# Patient Record
Sex: Female | Born: 1995 | Race: White | Hispanic: No | Smoking: Former smoker
Health system: Southern US, Community
[De-identification: ages and names within clinical notes are randomized; demographics above are authoritative.]

## PROBLEM LIST (undated history)

## (undated) ENCOUNTER — Inpatient Hospital Stay (HOSPITAL_COMMUNITY): Payer: Self-pay

## (undated) DIAGNOSIS — L309 Dermatitis, unspecified: Secondary | ICD-10-CM

## (undated) DIAGNOSIS — E162 Hypoglycemia, unspecified: Secondary | ICD-10-CM

## (undated) DIAGNOSIS — G43909 Migraine, unspecified, not intractable, without status migrainosus: Secondary | ICD-10-CM

## (undated) DIAGNOSIS — Z87442 Personal history of urinary calculi: Secondary | ICD-10-CM

## (undated) DIAGNOSIS — F419 Anxiety disorder, unspecified: Secondary | ICD-10-CM

## (undated) DIAGNOSIS — R112 Nausea with vomiting, unspecified: Secondary | ICD-10-CM

## (undated) DIAGNOSIS — N200 Calculus of kidney: Secondary | ICD-10-CM

## (undated) DIAGNOSIS — F99 Mental disorder, not otherwise specified: Secondary | ICD-10-CM

## (undated) DIAGNOSIS — R002 Palpitations: Secondary | ICD-10-CM

## (undated) DIAGNOSIS — Z9889 Other specified postprocedural states: Secondary | ICD-10-CM

## (undated) DIAGNOSIS — J45909 Unspecified asthma, uncomplicated: Secondary | ICD-10-CM

## (undated) HISTORY — DX: Migraine, unspecified, not intractable, without status migrainosus: G43.909

## (undated) HISTORY — DX: Dermatitis, unspecified: L30.9

## (undated) HISTORY — PX: SINOSCOPY: SHX187

## (undated) HISTORY — DX: Palpitations: R00.2

## (undated) HISTORY — DX: Hypoglycemia, unspecified: E16.2

## (undated) HISTORY — DX: Mental disorder, not otherwise specified: F99

## (undated) HISTORY — DX: Unspecified asthma, uncomplicated: J45.909

## (undated) HISTORY — DX: Calculus of kidney: N20.0

## (undated) NOTE — ED Notes (Signed)
 Formatting of this note might be different from the original. Assumed care of pt.  Pt found lying in bed, awake, alert, smiling, cheerful, no sign of distress.  Pt's father @ bedside.  Pt denies NV, able to tolerate ice chips.  Pt denies abd pain.  Will continue to monitor pt.  Electronically signed by: CHRISTELLA JONELLE EVERITT MERIBETH RN 02/13/2013 11:34 PM  Electronically signed by everitt Meribeth, Hadassah Fine Renomeron (R.N.) at 02/13/2013 11:34 PM PDT

## (undated) NOTE — Procedures (Signed)
 Associated Order(s): Contraception Post-Procedure Diagnose(s): IMPLANTABLE SUBDERMAL CONTRACEPTIVE REMOVAL AND REINSERTION Formatting of this note might be different from the original.  Contraception Performed by: Evonnie Gains (M.D.), M.D. Authorized by: Tat, Sonya (M.D.), M.D.   Procedure(s) Performed: Removal and reinsertion, non-biodegradable drug delivery implant  CONSENT: Risks, benefits, alternatives and reasons for the procedure were discussed. All of the patient's questions were answered. Informed consent for the procedure was obtained.    TIMEOUT: The patients identity was verified.  The site was verified.  The procedure was verified.  Anesthesia used: local infiltration.  Anesthetic used: lidocaine  1%.  Removal and reinsertion, non-biodegradable drug delivery implant - Did you complete the entire procedure? Yes  Additional Procedure Documentation: Katherine Mora [id number] is a 31 year old [id number] female. Nexplanon/Implanon inserted on: 03/2016. Contraceptive plan: Nexplanon.  Patient's last menstrual period was 03/07/2019.  Pregnancy test: neg  Patient's dominant hand: right. Nexplanon inserted in left arm.  Risks, benefits, alternatives, limitations and side effects of Nexplanon addressed with patient.  Risks, benefits, alternatives of Nexplanon removal explained.   Preop diagnosis: Nexplanon removal with reinsertion Postop diagnosis: SAME  Patient was placed in supine position with the left arm externally rotated and the elbow bent to 90 degrees and the location of the implant was verified by palpation. The end closest to the elbow was marked with a sterile marker. The area was cleaned with Betadine .  Three ml of 1% lidocaine  was injected underneath the end of the implant closest to the elbow and along the insertion canal.  While pressing down on the end of the implant closest to the axilla, a small incision was made in the longitudinal direction of the arm at the  tip of the implant closest to the elbow.  The implant was gently pushed toward the incision until the tip became visible.  The implant was grasped with sterile forceps and gently removed in its entirety (with sharp/blunt dissection).   The Nexplanon sterile applicator was removed with care to keep the applicator sterile. Nexplanon was visually verified within the needle with the needle cap in place. Needle tip with beveled side up was inserted at a less than 20 degree angle until skin penetration.  Applicator was lowered to parallel the arm; the skin was tented with the tip of the needle while inserting to its full length. The needle was fully retracted, and the implant was easily palpable, both by me and by the patient.  The incision was closed with a steri strip and an adhesive bandage was applied.  A pressure bandage using sterile gauze was applied.  The patient tolerated the procedure well and there were no complications.  Post procedure instructions reviewed with patient. Patient was given her User card to indicate date of insertion and removal.  Keep area dry for 24 hours. Remove pressure bandage in 24 hours and Band-Aid in 3 days. Remove steristrip in 7 days.   GAINS TAT MD Electronically signed 9/22/202010:46 AM [facility] [address] [place] [address] [phone]   Electronically signed by Evonnie Gains (M.D.), M.D. at 03/26/2019 10:58 AM PDT

## (undated) NOTE — ED Notes (Signed)
 Formatting of this note might be different from the original. REPORT GIVEN TO ZACHARY RN FOR CONTINUITY OF CARE  Electronically signed by: MERYLL JOANE URRIZA RN 10/22/2013 3:02 AM  Electronically signed by Urriza, Meryll Joane (R.N.) at 10/22/2013  3:02 AM PDT

## (undated) NOTE — ED Provider Notes (Signed)
 Formatting of this note is different from the original.   HistoryGLYNDA Mora MRN: [id number]  DOB: 08/28/1995  Physician start time: 9:46 PM 02/13/2013.  CHIEF COMPLAINT: VOMITING AND DIARRHEA  TRIAGE NURSE  PRESENTING HISTORY History of Present Illness: states abd discomfort , nausea and vomiting and diarrhea on and off x 1 week Associated Symptoms: dry membranes noted , no distress. General Appearance-DistressLevel: MILD Pre Hospital Care: REST Industrial Related: NO Stated Medical History: ASTHMA  _______________________________________________________________   History was provided by patient and father  Katherine Mora is a 89 year old female with intermittent diarrhea, no melena or hematochezia, x 1 week, approximately 4-5 times per day, with vomiting beginning today, 4 episodes, non-bloody, non-bilious, associated with fevers once today.  Last emesis at 4pm.  Patient ate a sandwich at 5pm, without vomiting since.  Patient had 3 episodes of diarrhea again today.  No recent travel/antibiotics/camping.  No sick contacts. Mild diffuse intermittent non-radiating abdominal cramping alleviated by vomiting episodes.  -------------------------------------------------------------------------------------------------------------------  PRIMARY CARE PROVIDER: Martin Junella Dragon (D.O.)  ACTIVE PROBLEM LIST: Patient Active Problem List:   MIGRAINE   EXERCISE INDUCED ASTHMA   INFLUENZA VACCINATION NOT CARRIED OUT BECAUSE OF CAREGIVER REFUSAL   BMI PEDS >=95 PERCENTILE   SLEEP DISORDER   ADHD, INATTENTIVE   PRECONCEPTION COUNSELING  MEDICINE: No Facility-Administered Medications for the 02/13/13 encounter [facility] Encounter). Outpatient Prescriptions Marked as Taking for the 02/13/13 encounter [facility] Encounter): Dextroamphetamine (DEXEDRINE SPANSULE) 10 mg Oral SR Cap, TAKE 1 CAPSULE ORALLY EVERY MORNING Naratriptan (AMERGE) 2.5 mg Oral Tab, Take 1 tablet by mouth at  onset of migraine headache. May repeat after 4 hours if migraine is not relieved. Do not exceed 2 tablets in 24 hours or 6 tablets per week SUMAtriptan (IMITREX) 50 mg Oral Tab, Take 1 tablet by mouth at onset of migraine headache. May repeat 1 time after 2 hours if migraine is not relieved. Do not exceed 4 tablets in 24 hours Fexofenadine (ALLEGRA ALLERGY ) 60 mg Oral Tab, 1 TAB PO BID Albuterol  (PROAIR  HFA) 90 mcg/actuation Inhl HFAA, SHAKE WELL AND INHALE 2 PUFFS ORALLY EVERY 4 TO 6 HOURS AS NEEDED FOR SHORTNESS OF BREATH OR WHEEZING - 90 DAY SUPPLY FOR ASTHMA IS 1 CANISTER (8.5 GM) Inhalational Spacer (AEROCHAMBER Z-STAT PLUS-FLW SG) Misc Spcr, USE AS DIRECTED  ALLERGY : Penicillins Class  SOCIAL HISTORY:  Social History  Marital Status: Single          Social History Main Topics  Smoking Status: Passive Smoke Exposure - Neve*  Packs/Day: 0.00  Years:       Smokeless Status: Never Used                      Comment: both parents smoke outside  IMMUNIZATION:  Immunization History Administered            Date(s) Administered  DTaP (Diphtheria, Tetanus, acellular Pertussis)                        05/28/1997  09/01/1997  09/29/1997                          12/29/1998  02/19/2002    HAV ped (Hepatitis A)                        02/19/2002  08/06/2004    HBV ped/adol, 3dose sch (Hepatitis B)  05/28/1997  09/01/1997  12/29/1998    HIB (Haemophilus influenzae b)                        05/28/1997  09/01/1997  12/29/1998    HPV4 (Human papillomavirus, quadrivalent)                        09/09/2008  11/10/2008  03/10/2009    MENcn-ACYW (Meningococcal conjugate, groups ACYW-135)                        11/10/2008    MMR (Measles, Mumps, Rubella)                        03/31/1998  02/19/2002    POL-IPV (Polio, Inactivated virus)                        12/29/1998  02/19/2002    POL-OPV (Polio, live virus)                        05/28/1997  09/01/1997    Tdap (ADACEL)  (Tetanus, diphtheria, acellular pertussis)                        09/09/2008    VAR (Varicella, chickenpox)                        03/31/1998  09/29/2005    Initial VS: BP: 120/75 mmHg / Pulse: 82 / Temp: 98 F (36.7 C) / Resp: 20 / SpO2: 99 %   History Reviewed:  I have reviewed the Medical/Surgical, Family and Social history as displayed in [facility] on the date of the encounter or the portion(s) as noted in the progress note.  Review of Systems  Constitutional: Negative for fever.  Skin: Negative for rash.  Cardiovascular: Negative for chest pain.  Respiratory: Negative for shortness of breath.   Gastrointestinal: Positive for nausea, vomiting, abdominal pain and diarrhea. Negative for constipation, blood in stool and melena.  Genitourinary: Negative for dysuria, urgency and frequency.  Musculoskeletal: Negative for back pain.  Neurological: Negative for dizziness and headaches.  Psychiatric/Behavioral: Negative for depression and suicidal ideas.  All other systems reviewed and are negative.  Physical Exam  Constitutional: She is oriented to person, place, and time. She appears well-developed and well-nourished. No distress.  HENT:  Head: Normocephalic and atraumatic.  Mouth/Throat: Oropharynx is clear and moist.  Eyes: Conjunctivae are normal.  Neck: Normal range of motion. Neck supple.  Cardiovascular: Normal rate, regular rhythm and normal heart sounds.   No murmur heard. Pulmonary/Chest: Effort normal and breath sounds normal. No respiratory distress. She has no wheezes. She has no rales.  Abdominal: Soft. Bowel sounds are normal. She exhibits no distension. There is no tenderness. There is no rebound, no guarding, no tenderness at McBurney's point and negative Murphy's sign.  Musculoskeletal: Normal range of motion. She exhibits no edema and no tenderness.  Neurological: She is alert and oriented to person, place, and time.  Skin: Skin is warm and dry.  Psychiatric:  She has a normal mood and affect. Her behavior is normal. Judgment and thought content normal.  Nursing note and vitals reviewed.        Results for orders placed during the hospital encounter of 02/13/13  -CBC W DIFFERENTIAL,  AUTO  WBC'S AUTO x1000/mcL  6.6   Range:   4.0 - 11.0 x1000/mcL   RBC, AUTO Mill/mcL  5.00   Range:   4.20 - 5.40 Mill/mcL   HGB g/dL  85.2   Range:   87.9 - 16.0 g/dL   HCT, AUTO %  56.6   Range:   37.0 - 47.0 %   MCV fL  86.6   Range:   81.0 - 99.0 fL   MCH pg/cell  29.5   Range:   27.0 - 35.0 pg/cell   MCHC g/dL  65.8   Range:   67.9 - 37.0 g/dL   RDW, BLOOD %  86.6   Range:   11.5 - 14.5 %   PLATELETS, AUTOMATED COUNT x1000/mcL  209   Range:   130 - 400 x1000/mcL   -ELECTROLYTE PANEL (NA, K, CL, CO2)  SODIUM mEq/L  141   Range:   135 - 145 mEq/L   POTASSIUM mEq/L  3.8   Range:   3.5 - 5.0 mEq/L   CHLORIDE mEq/L  109   Range:   101 - 111 mEq/L   CO2 mEq/L  27   Range:   21 - 31 mEq/L   -BUN, SERUM  BUN mg/dL  11   Range:   <=81 mg/dL   -CREATININE, SERUM  CREATININE mg/dL  9.28   Range:   <=8.99 mg/dL   GLOMERULAR FILTRATION RATE mL/min/BSA  NOT VALID <10YR        RACE   Non Black        -GLUCOSE, RANDOM  GLUCOSE, RANDOM mg/dL  90   Range:   70 - 859 mg/dL   -BILIRUBIN, TOTAL, SERUM  BILIRUBIN, TOTAL mg/dL  0.6   Range:   <=8.9 mg/dL   -ALKALINE PHOSPHATASE  ALKALINE PHOSPHATASE U/L  75   Range:   <=350 U/L   -AST, SERUM  AST U/L  19   Range:   <=30 U/L   -ALT, SERUM  ALT U/L  15   Range:   <=54 U/L   -LIPASE  LIPASE U/L  24   Range:   <=58 U/L   -WBC DIFFERENTIAL, AUTOMATED  NEUTROPHILS %, AUTOMATED COUNT %  48.8   Range:   42.0 - 75.0 %   LYMPHOCYTES %, AUTOMATED COUNT %  39.0   Range:   20.0 - 51.0 %   MONOS %, AUTO %  8.8   Range:   1.0 - 12.0 %   EOSINOPHILS %, AUTOMATED COUNT %  3.1   Range:   0.0 - 10.0 %   BASOPHILS %, AUTOMATED COUNT %  0.3   Range:   0.0 - 1.0 %   RBC NUCLEATED AUTO COUNT, BLD %  0.0   Range:   <=0.0 %    NEUTROPHILS, ABSOLUTE, AUTOMATED COUNT x1000/mcL  3.2   Range:   1.5 - 8.0 x1000/mcL   LYMPHOCYTES, AUTOMATED COUNT x1000/mcL  2.6   Range:   1.2 - 3.4 x1000/mcL   MONOCYTES, AUTOMATED COUNT x1000/mcL  0.6   Range:   0.1 - 1.0 x1000/mcL   EOSINOPHILS, AUTOMATED COUNT x1000/mcL  0.2   Range:   0.0 - 0.7 x1000/mcL   BASOPHILS, AUTOMATED COUNT x1000/mcL  0.0   Range:   0.0 - 0.2 x1000/mcL   11:35 PM  Patient unable to provide stool studies at this time -- she was given sample cups to take home and then bring to her primary care doctor if diarrhea persists.  EMERGENCY DEPARTMENT COURSE:  Orders Placed This Encounter  ? CBC W DIFFERENTIAL, AUTO  ? ELECTROLYTE PANEL (NA, K, CL, CO2)  ? BUN, SERUM  ? CREATININE, SERUM  ? GLUCOSE, RANDOM  ? BILIRUBIN, TOTAL, SERUM  ? ALKALINE PHOSPHATASE  ? AST, SERUM  ? ALT, SERUM  ? LIPASE  ? STOOL WBC  ? O AND P IDENTIFICATION  ? STOOL CULTURE  ? WBC DIFFERENTIAL, AUTOMATED  ? Sodium Chloride  0.9 % Inj Syg (NORMAL SALINE FLUSH)  ? Sodium Chloride  0.9 % Bolus IV Soln  ? Ondansetron  (PF) Inj 4 mg (ZOFRAN )  ? PREGNANCY TEST, URINE, POCT   ________________________________________________________________    ED vitals sign trends: Patient Vitals for the past 10 hrs:  BP Temp Pulse Resp SpO2 Weight  02/13/13 2232 115/65 mmHg - 79 18 100 % -  02/13/13 2132 120/75 mmHg 98 F (36.7 C) 82 20 99 % 81.9 kg (180 lb 8.9 oz)     ________________________________________________________________ MDM: Well-appearing patient, tolerating POs in ED, with normal laboratory workup and vitals.  No longer nauseous.  No indication for imaging given benign abdomen.  No indication for antibiotics though protracted diarrhea course warrants stool studies, sample for which she will bring to her primary care doctor.  Low suspicion clinically for appendicitis, cholecystitis, diverticulitis, or other serious intraabdominal pathology.  Strict return precautions discussed with  family/patient who expressed understanding. Patient and family are happy with plan.   ASSESSMENT: 1) acute gastroenteritis  PLAN: Please see ED Course Primary care doctor 1-2 days -- bring stool samples Return precautions      Electronically signed by: Rosaline LULLA Dage, M.D. [facility]  Emergency Department  02/13/2013 11:39 PM   Electronically signed by Dage Rosaline Simmer (M.D.) at 02/13/2013 11:39 PM PDT

## (undated) NOTE — ED Provider Notes (Signed)
 Formatting of this note is different from the original.   Chief Complaint  Patient presents with  ? ALLERGIC REACTION   Katherine Mora is a 32 year old female female with history of fibromyalgia presenting with allergic reaction.  Patient notes being started on Dulera, taking a dose roughly 5 hours ago then subsequently feeling chest tightness and shortness of breath.  She denies facial swelling or tongue tongue swelling.  She denies rash.  She notes that she is feeling improved at this time.  No fever, cough.  Notes mild sore throat.  No other modifying factors.  Patient Active Problem List:    MIGRAINE    DECLINES INFLUENZA VACCINATION BY CAREGIVER    SLEEP DISORDER    ADHD, PREDOMINANTLY INATTENTIVE PRESENTATION    PRECONCEPTION COUNSELING    DECLINES INFLUENZA VACCINATION    INTERMITTENT ASTHMA    ALLERGIC RHINITIS    BILAT HIP JOINT PAIN    INJECTABLE CONTRACEPTIVE SURVEILLANCE    FIBROMYALGIA    PRESENCE OF IMPLANTABLE SUBDERMAL CONTRACEPTIVE    OBESITY, BMI 31-31.9, ADULT  Past Surgical History:   Past Surgical History:  Procedure Laterality Date  ? NASAL SEPTOPLASTY W TURBINATE REDUCTION Bilateral 06/26/2013   Procedure: NASAL SEPTOPLASTY W TURBINATE REDUCTION;  Laterality: Bilateral;  Surgeon: Leigh Ginny Rasmussen (M.D.)  ? TURBINATE CAUTERIZATION OF NOSE Bilateral 09/20/2019   Procedure: NASAL TURBINATE CAUTERIZATION;  Laterality: Bilateral;  Surgeon: Mayeno, John Kunio (M.D.), M.D.  ? PAST SURGICAL HISTORY, OTHER     None   Current Facility-Administered Medications for the 12/21/19 encounter [facility] Encounter)  Medication Dose  ? Etonogestrel Implant 68 mg (NEXPLANON)  1 Each  ? Sodium Chloride  0.9% IV Premix     No outpatient medications have been marked as taking for the 12/21/19 encounter [facility] Encounter).   Allergies:  Effexor [venlafaxine hcl], Penicillins class, Prednisone, and Clindamycin hydrochloride Social History   Tobacco Use  ? Smoking status:  Former Smoker    Packs/day: 0.25    Years: 7.00    Pack years: 1.75    Types: Cigarettes    Quit date: 09/04/2018    Years since quitting: 1.2  ? Smokeless tobacco: Never Used  Substance Use Topics  ? Alcohol use: No    Alcohol/week: 0.0 oz  ? Drug use: Not Currently    Comment: medical marijuana   Primary Care MD:  Uypitching, Cindy Justo (M.D.) BP (!) 145/84 (BP Location: LA-LEFT ARM)   Pulse 100   Temp 98 F (36.7 C)   Resp 16   Wt 101 kg (222 lb 10.6 oz)   SpO2 97%   BMI 38.22 kg/m    Review of Systems  Constitutional: Negative for chills and fever.  HENT: Negative for congestion.   Eyes: Negative for pain, redness and itching.  Respiratory: Positive for shortness of breath. Negative for cough.   Cardiovascular: Positive for chest pain.  Gastrointestinal: Negative for abdominal pain, diarrhea, nausea and vomiting.  Genitourinary: Negative for dysuria.  Musculoskeletal: Negative for myalgias.  Skin: Negative for rash.  Neurological: Negative for dizziness and headaches.  Hematological: Does not bruise/bleed easily.  Psychiatric/Behavioral: Negative for agitation and behavioral problems.   Physical Exam Vitals and nursing note reviewed.  Constitutional:      General: She is not in acute distress.    Appearance: Normal appearance. She is well-developed. She is not ill-appearing, toxic-appearing or diaphoretic.     Comments: BP (!) 145/84 (BP Location: LA-LEFT ARM)   Pulse 100   Temp 98 F (36.7  C)   Resp 16   Wt 101 kg (222 lb 10.6 oz)   SpO2 97%   BMI 38.22 kg/m   Nontoxic, well-appearing, no distress  HENT:     Head: Normocephalic and atraumatic.     Comments: Oropharynx within normal limits.  No tongue swelling noted    Right Ear: Hearing and external ear normal.     Left Ear: Hearing and external ear normal.     Nose: Nose normal.  Eyes:     General: No scleral icterus.    Conjunctiva/sclera: Conjunctivae normal.  Cardiovascular:     Rate and  Rhythm: Normal rate and regular rhythm.     Pulses: Normal pulses.     Heart sounds: Normal heart sounds.  Pulmonary:     Effort: Pulmonary effort is normal.     Breath sounds: Normal breath sounds. No wheezing, rhonchi or rales.     Comments: Lungs are clear to auscultation Abdominal:     Palpations: Abdomen is soft.     Tenderness: There is no abdominal tenderness. There is no guarding or rebound.  Musculoskeletal:        General: Normal range of motion.  Skin:    General: Skin is warm and dry.  Neurological:     Mental Status: She is alert and oriented to person, place, and time.  Psychiatric:        Speech: Speech normal.        Behavior: Behavior normal. Behavior is cooperative.       \  ASSESSMENT:   ICD-10-CM  1. ALLERGIC REACTION, INIT  T80.44XA   46 year old female in no distress presenting with possible allergic reaction.  Lungs are clear.  She does not exhibit any signs of facial swelling, angioedema or tongue swelling.  Patient treated with oral Decadron and oral Benadryl .  Unlikely anaphylaxis or airway emergency.  Recommend follow-up with regular doctor in 4-5 days  Orders Placed This Encounter  ? diphenhydrAMINE  Cap 25 mg (BENADRYL )  ? dexAMETHasone Tab 6 mg (DECADRON)   Electronically signed by Quintin Picking, MD  Emergency Medicine [facility] [place] 12/21/2019 3:40 AM  Electronically signed by Picking Quintin Dumas (M.D.), M.D. at 12/21/2019  3:42 AM PDT

## (undated) NOTE — ED Provider Notes (Signed)
 Formatting of this note might be different from the original.   History: Katherine Mora is a  17 years  6 months female C/o 1 d h/o mild chest pain, worse when she takes deep breath or moves trunk. No focal weakness or numbness.  No problems with speech or gait. No abdomen pain lmp 1 week No exertional sx. No n/v/d No dizzy or syncope. Patient. C/o anxiety. No fever or cough. No dvt risk factors.  Patient Active Problem List:    MIGRAINE    EXERCISE INDUCED ASTHMA    INFLUENZA VACCINATION NOT CARRIED OUT BECAUSE OF CAREGIVER REFUSAL    BMI PEDS >=95 PERCENTILE    SLEEP DISORDER    ADHD, INATTENTIVE    PRECONCEPTION COUNSELING  PSH: Past Surgical History:   PAST SURGICAL HISTORY, OTHER                                    Comment:None   NASAL SEPTOPLASTY W TURBINATE REDUCTION         Bilateral 06/26/2013     Comment:Procedure: NASAL SEPTOPLASTY W TURBINATE               REDUCTION;  Laterality: Bilateral;  Surgeon:               Leigh Ginny Rasmussen (M.D.)  Outpatient Prescriptions Marked as Taking for the 10/22/13 encounter [facility] Encounter): Dextroamphetamine (DEXEDRINE SPANSULE) 10 mg Oral SR Cap, Take 1 capsule by mouth every morning Albuterol  (PROAIR  HFA) 90 mcg/actuation Inhl HFAA, SHAKE WELL AND INHALE 2 PUFFS ORALLY EVERY 4 TO 6 HOURS AS NEEDED FOR SHORTNESS OF BREATH OR WHEEZING - 90 DAY SUPPLY FOR ASTHMA IS 1 CANISTER (8.5 GM)  ALLERGY :  Penicillins Class       Skin Rash and/or Hives  Social History   Marital Status: Sport And Exercise Psychologist* Spouse Name:                      Years of Education:                 Number of children:             Social History Main Topics   Smoking Status: Passive Smoke Exposure - Neve*  Packs/Day: 0.00  Years:         Smokeless Status: Never Used                       Comment: both parents smoke outside  Review of patient's family history indicates:   Coronary Artery Disease        Father                   Coronary Artery  Disease        Grandmother             Primary care physician: Katherine Mora (D.O.)  BP 131/79   Pulse 77   Temp(Src) 98.7 F (37.1 C)   Resp 20   Ht 1.651 m (5' 5)   Wt 83.8 kg (184 lb 11.9 oz)   BMI 30.74 kg/m2     SpO2 97%     Physical Exam  Constitutional: She is oriented to person, place, and time. She appears well-developed and well-nourished.  Well-appearing Breathing comfortably  HENT:  Head: Normocephalic and atraumatic.  Right Ear: External ear normal.  Left Ear: External ear normal.  Nose: Nose normal.  Mouth/Throat: Oropharynx is clear and moist.  Eyes: Conjunctivae and EOM are normal. Pupils are equal, round, and reactive to light. Right eye exhibits no discharge. Left eye exhibits no discharge. No scleral icterus.  Neck: Normal range of motion and full passive range of motion without pain. Neck supple. No JVD present. No Brudzinski's sign and no Kernig's sign noted.  Cardiovascular: Normal rate, regular rhythm, S1 normal, S2 normal, normal heart sounds and intact distal pulses.  Exam reveals no gallop and no friction rub.   No murmur heard. +2 symmetric peripheral pulses  Pulmonary/Chest: Effort normal and breath sounds normal. No stridor. No respiratory distress. She has no wheezes. She has no rales. She exhibits tenderness.  Abdominal: Soft. Bowel sounds are normal. She exhibits no distension and no mass. There is no tenderness. There is no rebound and no guarding.  Musculoskeletal: Normal range of motion. She exhibits no edema or tenderness.  Lymphadenopathy:    She has no cervical adenopathy.  Neurological: She is alert and oriented to person, place, and time. No cranial nerve deficit. She exhibits normal muscle tone. Coordination normal.  Motor 5/5 x 4 Sens wnl Nl speech/gait  Skin: Skin is warm and dry. No rash noted. No erythema. No pallor.  Psychiatric: She has a normal mood and affect. Her behavior is normal. Judgment normal.  Nursing note and  vitals reviewed.        cxr no infiltrate/effusion  hcg neg.  ekg nsr  toradol --relief  Impression:  Atypical chest pain  Findings, differential diagnosis, plan and possible outcomes discussed with patient. Red flag symptoms/signs discussed with patient, who expresses understanding, agreement, and appreciation. All questions regarding their care have been answered.  Strict ED return precautions were given to the patient. The patient was instructed to return immediately to the ED if any changes or worsening of condition should occur.  I have immediately routed a copy of this note to Katherine Mora (D.O.) to notify of this patient's visit and to coordinate further management.  Electronically signed by: Electronically signed by Dr. Marsa POUR. [facility] Emergency [facility] [place] 10/22/2013  10/22/2013 3:48 AM  Electronically signed by Cam Marsa POUR (M.D.) at 10/22/2013  3:48 AM PDT Electronically signed by Cam Marsa POUR (M.D.) at 10/22/2013  3:48 AM PDT

## (undated) NOTE — ED Provider Notes (Signed)
 Formatting of this note is different from the original.   History: 10/20/2012 10:28 PM  TRIAGE NURSE PRESENTING HISTORY History of Present Illness: migraine headaches ,  DIZZINESS, NAUSEA, NO VOMITING. SX STARTED 3 DAYS AGO General Appearance-DistressLevel: MILD Pre Hospital Care: MEDICATIONS (aleve )    Katherine Mora is a 17 year old female who presents with MIGRAINE HEADACHE and LOW BLOOD PRESSURE. History of migraine headache x 5 days. Taking naproxen . Also takes two tabs of elavil at bedtime. Endorsing typical bilateral-frontal headache x 3-4 days, constant, not going away. Reports history of migraine headache usually 2x/week. Frequently associated with white spots in vision. This headache not worse of life, rates 7/10, not sudden onset. Patient denies fever, neck pain/stiffness, trauma/inciting event, vomiting, acute hearing changes, ear pain, eye pain, tinnitus, chest pain, palpitations, shortness of breath or difficulty breathing, cough, abdominal pain, dysuria, focal weakness, changes in sensation, changes in gait/speech, or any other concerns. Here with father who states checked patient's blood pressure today and it was low, systolic 80s. Usually in 110s.   No Facility-Administered Medications for the 10/20/12 encounter [facility] Encounter). Outpatient Prescriptions Marked as Taking for the 10/20/12 encounter [facility] Encounter): Amitriptyline (ELAVIL) 10 mg Oral Tab Dextroamphetamine (DEXEDRINE SPANSULE) 10 mg Oral SR Cap Naproxen  (NAPROSYN ) 500 mg Oral Tab  Past Medical History:  Patient Active Problem List:   MIGRAINE   EXERCISE INDUCED ASTHMA   INFLUENZA VACCINATION NOT CARRIED OUT BECAUSE OF CAREGIVER REFUSAL   BMI PEDS >=95 PERCENTILE   SLEEP DISORDER   ADHD, INATTENTIVE  Social History:    Smoking Status: Passive Smoke Exposure - Neve*  Packs/Day: 0.00  Years:       Smokeless Status: Never Used                      Comment: both parents smoke outside  Allergy :    Penicillins Class       Skin Rash and/or Hives  Vital Signs:  BP 117/63  Pulse 79  Temp(Src) 98 F (36.7 C)  Resp 20  Wt 80.8 kg (178 lb 2.1 oz)  SpO2 99%  History Reviewed:  I have reviewed the Medical/Surgical, Family and Social history as displayed in [facility] on the date of the encounter or the portion(s) as noted in the progress note.  Review of Systems  Constitutional: Negative for fever.  Skin: Negative for rash.  HENT: Negative for hearing loss, ear pain, neck pain and tinnitus.   Eyes: Negative for blurred vision, double vision, photophobia, pain and redness.  Cardiovascular: Negative for chest pain and palpitations.  Respiratory: Negative for cough and sputum production.   Gastrointestinal: Positive for nausea. Negative for vomiting and abdominal pain.  Genitourinary: Negative for dysuria.  Endo/Heme/Allergies: Does not bruise/bleed easily.  Neurological: Positive for headaches. Negative for tingling, sensory change, speech change, focal weakness, seizures and loss of consciousness.  Psychiatric/Behavioral: Negative for substance abuse.   Physical Exam  Constitutional: She is oriented to person, place, and time. She appears well-developed and well-nourished.  BP 117/63  Pulse 79  Temp(Src) 98 F (36.7 C)  Resp 20  Wt 80.8 kg (178 lb 2.1 oz)  SpO2 99%. Non-toxic, well appearing, minimal distress. Pleasant and cooperative. On phone, speaking comfortably in full sentences.    HENT:  Head: Normocephalic and atraumatic.  Mouth/Throat: Oropharynx is clear and moist.  No facial/temporal tenderness to palpation.   Eyes: Conjunctivae and EOM are normal. Pupils are equal, round, and reactive to light.  Neck: Normal range of  motion. Neck supple.  No meningismus   Cardiovascular: Normal rate, regular rhythm, normal heart sounds and intact distal pulses.  Exam reveals no gallop and no friction rub.   No murmur heard. Pulmonary/Chest: Effort normal and breath sounds  normal. No respiratory distress. She has no wheezes. She has no rales.  Abdominal: Soft. Bowel sounds are normal. She exhibits no distension. There is no tenderness. There is no rebound and no guarding.  Musculoskeletal: Normal range of motion.  Neurological: She is alert and oriented to person, place, and time. No cranial nerve deficit. Coordination normal.  Normal finger to nose bilaterally. Normal pronator drift. Strength 5/5 all four extremities. Normal romberg. Steady gait.   Skin: Skin is warm and dry.  Psychiatric: She has a normal mood and affect.  Nursing note and vitals reviewed.       RESULTS RADIOLOGY - none  LABS Component     Latest Ref Rng 10/20/2012  NEUTROPHILS % AUTO     42.0 - 75.0 % 50.4  LYMPHS % AUTO     20.0 - 51.0 % 38.8  MONOS % AUTO     1.0 - 12.0 % 7.6  EOS % AUTO     0.0 - 10.0 % 2.8  BASO'S % AUTO     0.0 - 1.0 % 0.4  RBC NUCLEATED, AUTO     <=0.0 % 0.0  ANC     1.5 - 8.0 x1000/mcL 3.9  LYMPHS AUTO     1.2 - 3.4 x1000/mcL 3.0  MONOS AUTO     0.1 - 1.0 x1000/mcL 0.6  EOS AUTO     0.0 - 0.7 x1000/mcL 0.2  BASO AUTO CNT     0.0 - 0.2 x1000/mcL 0.0  UA GLUC     Negative mg/dL <69 (Neg)  UA KETONES     Negative mg/dL <89 (Neg)  UA SP GR     1.005 - 1.030 1.018  UA HGB     Negative mg/dL <9.96 (Neg)  PH UA     5.0 - 8.0 6.5  UA PROT     <30 (1+) mg/dL <89 (Neg)  UA NO2     Negative Negative  LE, UA     Negative Negative  UROBILINOGEN UA QL     Negative mg/dL <7.9 (Neg)  UA BILI     Negative mg/dL <9.4 (Neg)  WBC'S AUTO     4.0 - 11.0 x1000/mcL 7.7  RBC AUTO     4.20 - 5.40 Mill/mcL 4.89  HGB     12.0 - 16.0 g/dL 85.4  HCT AUTO     62.9 - 47.0 % 42.5  MCV     81.0 - 99.0 fL 87.0  MCH     27.0 - 35.0 pg/cell 29.6  MCHC     32.0 - 37.0 g/dL 65.9  RDW, BLOOD     88.4 - 14.5 % 13.0  PLT'S AUTO     130 - 400 x1000/mcL 231  NA     135 - 145 mEq/L 140  K     3.5 - 5.0 mEq/L 3.6  CL     101 - 111 mEq/L 108  CO2      21 - 31 mEq/L 26  CREAT     <=1.00 mg/dL 9.38  GFR      NOT VALID <35YR  BUN     <=18 mg/dL 13  GLUC     70 - 859 mg/dL 87  Urine Pregnancy: NEGATIVE  Emergency  department course Orders Placed This Encounter  ? CBC W DIFFERENTIAL, AUTO  ? ELECTROLYTES, SERUM  ? BUN, SERUM  ? CREATININE, SERUM   ? GLUCOSE, RANDOM  ? URINALYSIS, AUTOMATED WO MICRO   ? WBC DIFFERENTIAL, AUTOMATED  ? MEASURE PULSE OXIMETRY  ? ADMINISTER OXYGEN BY CANNULA / MASK, NURSING Titrate O2 to keep saturation at or above:: 90%; Starting Liters/Min/Mode:: * 2 L/min/Nasal cannula  ? MONITORING, CARDIAC   ? Sodium Chloride  0.9 % Bolus IV Soln  ? Metoclopramide Inj 10 mg (REGLAN)  ? PREGNANCY TEST, URINE, POCT   Vitals during Emergency department visit Filed Vitals:   10/20/12 2137 10/20/12 2300  BP: 117/63 105/55  Pulse: 79 72  Temp: 98 F (36.7 C)   Resp: 20 13  Weight: 80.8 kg (178 lb 2.1 oz)   SpO2: 99% 98%   Reassess @ 10:32 PM BP 117/63  Pulse 79  Temp(Src) 98 F (36.7 C)  Resp 20  Wt 80.8 kg (178 lb 2.1 oz)  SpO2 99% Per note 09/25/12: A/P - Migraines - will have patient do Elavil starter pack as prescribed last year and follow up with neuro.  Return to clinic as needed. Discussed with patient/caregiver.  Patient instructions reviewed.  Patient to return for worsening or persistence of symptoms.  Patient and father expressed agreement with and understanding of plan. Patient states mother has not had time to take patient to neurologist office.   ASSESSMENT:  (346.90) MIGRAINE  Medical Decision Making: Signs and symtoms is consistent with a benign etiology, probably migraine/cluster vs tension. History, physical and ER workup do not suggest ICH, SAH, intracranial infection, temporal arteritis or other pathologic etiology. Patient with history of migraine headache, states having typical headache for her, just lasting longer.  Upon reassessment, patient states headache resolved. Patient well  appearing, vitals stable, afebrile. Plan to discharge patient home at this time which patient and father in agreement with. Advised of importance to follow up with neurology outpatient clinic as outpatient or return to Emergency Department for any new or worsening/concerning symptoms. All questions answered.   PLAN:  New Prescriptions   No medications on file   Disposition: Home Condition at discharge: Improved and stable   Electronically signed by: ROME BURNEY HOLLER MD 10/20/2012 11:53 PM   Electronically signed by Holler Rome Burney (M.D.) at 10/20/2012 11:53 PM PDT

## (undated) NOTE — ED Notes (Signed)
 Formatting of this note might be different from the original. Patient cleared for discharge after MD evaluation.  Patient ambulating with steady gait. NAD noted. A&O x4. All belongings with patient prior to departure. Discharge instructions given. Patient verbalized understanding.  Electronically signed by: ARLETTA HAZELINE RABON RN 12/21/2019 3:54 AM   Electronically signed by Rabon Arletta Hazeline (R.N.), R.N. at 12/21/2019  3:55 AM PDT

## (undated) NOTE — ED Notes (Signed)
 Formatting of this note might be different from the original. MD has cleared patient for D/C. No distress noted at this time. Father states that he will be driving patient home.  Electronically signed by: GEVORG ADJIAN RN 10/22/2013 4:01 AM  Electronically signed by Nancie Last (R.N.) at 10/22/2013  4:01 AM PDT

## (undated) NOTE — Nursing Note (Signed)
 Formatting of this note is different from the original. Arm band checked? Yes  No LMP recorded. (Menstrual status: Hormonal Induced). Last Pap Smear : 05/23/18 (GYN CYTOLOGY) Last Mammogram Test:  none found @INTVIOLENCE @ OB History    Gravida  1   Para  0   Term  0   Preterm  0   AB  0   Living  0    SAB  0   TAB  0   Ectopic  0   Multiple  0   Live Births       The current method of family planning is Arm implant.  [facility] [place] [address] [place] [address] Phone: [phone] Fax: [phone]  [facility] [place] [address] [place]. [place] [address] Phone: [phone] Fax: [phone]  [facility] [place] [facility] [id number] [place] [address] Phone: [phone] Fax: [phone]  Yes  Vitals:   03/26/19 1022  Height: 5' 5 (1.651 m)   Venna Ibanez exercises unknown minutes unknown days per week at a moderate or strenuous level.  Allergies reviewed Current Allergies:  Allergies as of 03/26/2019 - Reviewed 12/26/2018  Allergen Reaction Noted  ? Effexor [venlafaxine hcl] Other 02/22/2017  ? Penicillins class Skin Rash and/or Hives 10/15/2001  ? Prednisone  02/06/2018  ? Clindamycin hydrochloride Skin Rash and/or Hives 11/06/2015   Smoking status verified Social History   Tobacco Use  Smoking Status Passive Smoke Exposure - Never Smoker  Smokeless Tobacco Never Used    Proactive Office Encounter Actions:Blood Pressure Needed  Flu Immunization Due, pt declined  Missing Current Exercise Vitals  Tdap/Td Immunization Due, pt declined Update BMI - Take Height AND Weight  AVS GIVEN TO PATIENT WITH INSTRUCTION. Electronically signed by Almeida, Leidy Juliana (L.V.N.), L.V.N. at 03/26/2019 10:30 AM PDT Electronically signed by Almeida, Leidy Juliana (L.V.N.), L.V.N. at 03/26/2019 10:47 AM PDT

## (undated) NOTE — Progress Notes (Signed)
 Formatting of this note is different from the original. VIDEO APPOINTMENT VISIT (VAV)   Katherine Mora is a 36 year old female  Reason for VAV:  PRESCRIPTION REFILL REQUESTED  Health Maintenance Due  Topic Date Due  ? IMM DTAP,TDAP,TD (42 DAYS-120 YRS) (7 - Td) 09/10/2018  ? IMM INFLUENZA (6 MO AND OLDER) (1) 03/05/2019    VIDEO VISIT DOCUMENTATION:   HISTORY: Patient with hx of fibromyalgia and intermittent asthma.  Would like refill of Tramadol which she used very rarely. Some mild tightness with breathing but still ok. Would like refill of controller inhaler.  REVIEW OF SYSTEMS:  Per HPI  PHYSICAL EXAM Appears well and not in distress. Able to speak in full sentences.  Time spent with patient or guardian over the video was 6 minutes.  ASSESSMENT: Encounter Diagnoses  Code Name Primary?  ? M79.7 FIBROMYALGIA   ? J45.20 INTERMITTENT ASTHMA     PLAN: Orders Placed This Encounter  ? Ciclesonide (ALVESCO) 80 mcg/actuation Inhl HFAA  ? traMADoL (ULTRAM) 50 mg Oral Tab   - refilled Tramadol - refilled Alvesco  Electronically signed by: DORTHEA CELESTINE JOB MD 06/05/2019 2:42 PM  Electronically signed by Uypitching, Cindy Justo (M.D.), M.D. at 06/05/2019  2:44 PM PST

---
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
## 2016-07-04 DIAGNOSIS — M797 Fibromyalgia: Secondary | ICD-10-CM

## 2016-07-04 HISTORY — DX: Fibromyalgia: M79.7

## 2020-09-09 ENCOUNTER — Ambulatory Visit (INDEPENDENT_AMBULATORY_CARE_PROVIDER_SITE_OTHER): Payer: 59 | Admitting: Allergy & Immunology

## 2020-09-09 ENCOUNTER — Encounter: Payer: Self-pay | Admitting: Allergy & Immunology

## 2020-09-09 ENCOUNTER — Other Ambulatory Visit: Payer: Self-pay

## 2020-09-09 VITALS — BP 124/80 | HR 78 | Temp 97.6°F | Resp 18 | Ht 65.0 in | Wt 225.0 lb

## 2020-09-09 DIAGNOSIS — J4520 Mild intermittent asthma, uncomplicated: Secondary | ICD-10-CM | POA: Insufficient documentation

## 2020-09-09 DIAGNOSIS — K9049 Malabsorption due to intolerance, not elsewhere classified: Secondary | ICD-10-CM | POA: Insufficient documentation

## 2020-09-09 DIAGNOSIS — J301 Allergic rhinitis due to pollen: Secondary | ICD-10-CM | POA: Insufficient documentation

## 2020-09-09 MED ORDER — FLUTICASONE PROPIONATE 50 MCG/ACT NA SUSP: NASAL | 1 refills | Status: DC

## 2020-09-09 NOTE — Progress Notes (Signed)
NEW PATIENT  Date of Service/Encounter:  09/09/20  Referring provider: No primary care provider on file.   Assessment:   Mild intermittent asthma, uncomplicated  Food intolerance   Perennial and seasonal allergic rhinitis (ragweed, weeds, trees and cat)  Plan/Recommendations:   1. Food intolerance - Testing was negative to the entire panel. - There is a the low positive predictive value of food allergy testing and hence the high possibility of false positives. - In contrast, food allergy testing has a high negative predictive value, therefore if testing is negative we can be relatively assured that they are indeed negative.  - This certainly could be related to a food intolerance. - The best way to diagnose this is with things like dietary changes and elimination diets. - Tests like Everlywell are not really patient diet at all.  - There are naturopathic doctors in [place] if you are more interested in taking that route.  2. Mild intermittent asthma, uncomplicated - Lung testing looks great today. - It seems that avoiding a lot of your triggers has really improved control of your symptoms. - Continue with albuterol 4 puffs every 4-6 hours as needed.  3. Seasonal allergic rhinitis due to pollen - Testing today showed: ragweed, weeds, trees and cat - Copy of test results provided.  - Avoidance measures provided. - Continue with: Allegra D as needed - Start taking: Flonase (fluticasone) one spray per nostril daily for congestion. - You can use an extra dose of the antihistamine, if needed, for breakthrough symptoms.  - Consider nasal saline rinses 1-2 times daily to remove allergens from the nasal cavities as well as help with mucous clearance (this is especially helpful to do before the nasal sprays are given) - Consider allergy shots as a means of long-term control. - Allergy shots "re-train" and "reset" the immune system to ignore environmental allergens and decrease  the resulting immune response to those allergens (sneezing, itchy watery eyes, runny nose, nasal congestion, etc).    - Allergy shots improve symptoms in 75-85% of patients.  - We can discuss more at the next appointment if the medications are not working for you.  4. Return in about 2 months (around 11/09/2020).   Subjective:   Katherine Mora is a 25 y.o. female presenting today for evaluation of  Chief Complaint  Patient presents with  . Food Intolerance  . Allergy Testing  . Asthma    Katherine Mora has a history of the following: Patient Active Problem List   Diagnosis Date Noted  . Food intolerance 09/09/2020  . Seasonal allergic rhinitis due to pollen 09/09/2020  . Mild intermittent asthma, uncomplicated 09/09/2020    History obtained from: chart review and patient.  Katherine Mora was referred by No primary care provider on file.     Katherine Mora is a 25 y.o. female presenting for an evaluation of environmental and food allergies.  Her godparents moved out here one year before. She has a cousin in [place] with whom she is not close. Her parents and brother moved here and then the rest of the family moved out as well.     Asthma/Respiratory Symptom History: She is currently on albuterol as needed. She was previously on Advair when she lived in [place]. She stoped around one month after she got here. She is unsure of her trigger but likely air pollution and fire exposure.   Allergic Rhinitis Symptom History: She has environmental allergies to two types of grass. She was told that it was  a level 4 allergy. This was all on blood testing which was done four years. She was not on shots at all. She currntly takes Allegra D as needed but she mostly takes it when she is very congested. She was getting antibiotics for sinus infections 4 times annually but this is now improved. Last antibiotics was 18 months ago. She moved here in October 2021. She had blood testing that was in  2016.   Food Allergy Symptom History: She knows that she is sensitive to a lot of foods. She has issues with a number of foods. She has been evaluated for IBS but this was negative per the patient. She has seen GI several times since she had stomach issues when she was 25 years of age. [place] is a [employer]. She did have an endoscopy performed and had ulcers in her stomach. She tells me that she had a bacterial infection in her stomach that was not discovered for 2 years. She was treated with antibiotics. It seems that this was H pylori and she received triple antibiotic therapy for this. She completed treatment for 3-4 weeks. Ulcers might be cleared up, but she never had a follow up endoscopy.   She is looking for food sensitivity testing. She has tried elimination diets. She knows that limiting tomatoes led to improved symptoms.  Otherwise, there is no history of other atopic diseases, including drug allergies, stinging insect allergies, eczema, urticaria or contact dermatitis. There is no significant infectious history. Vaccinations are up to date.    Past Medical History: Patient Active Problem List   Diagnosis Date Noted  . Food intolerance 09/09/2020  . Seasonal allergic rhinitis due to pollen 09/09/2020  . Mild intermittent asthma, uncomplicated 09/09/2020    Medication List:  Allergies as of 09/09/2020      Reactions   Penicillins Anaphylaxis   Family History    Tricyclic Antidepressants Other (See Comments)   Panic Attacks, Asthma Flare    Prednisone & Diphenhydramine Rash      Medication List       Accurate as of September 09, 2020  1:36 PM. If you have any questions, ask your nurse or doctor.        ALBUTEROL SULFATE HFA IN Inhale 2 puffs into the lungs every 4 (four) hours as needed.   Baclofen 5 MG Tabs Take 15 mg by mouth at bedtime as needed.   fluticasone 50 MCG/ACT nasal spray Commonly known as: Flonase 1 spray per nostril daily for congestion. Started by:  Alfonse Spruce, MD   traMADol 50 MG tablet Commonly known as: ULTRAM Take 50 mg by mouth every 6 (six) hours as needed.       Birth History: non-contributory  Developmental History: non-contributory  Past Surgical History: Past Surgical History:  Procedure Laterality Date  . NASAL SEPTUM SURGERY Bilateral 2015  . SINOSCOPY       Family History: Family History  Problem Relation Age of Onset  . Heart attack Father   . Eczema Brother   . Allergic rhinitis Brother   . Urticaria Brother      Social History: Katherine Mora lives at home with her parents. She lives in a house that was built in 1990s. There is wood in the main living areas and carpeting in the bedroom. They have a heat pump for heating and central cooling. There are dogs and birds in the home. There are dust mite coverings on the bedding. There is no tobacco exposure at all. She  currently works at [employer] as a Conservation officer, nature.     Review of Systems  Constitutional: Negative.  Negative for chills, fever, malaise/fatigue and weight loss.  HENT: Positive for congestion. Negative for ear discharge, ear pain, sinus pain and sore throat.        Positive for postnasal drip.  Eyes: Negative for pain, discharge and redness.  Respiratory: Negative for cough, sputum production, shortness of breath, wheezing and stridor.   Cardiovascular: Negative.  Negative for chest pain and palpitations.  Gastrointestinal: Positive for abdominal pain and nausea. Negative for constipation, diarrhea, heartburn and vomiting.  Skin: Negative.  Negative for itching and rash.  Neurological: Negative for dizziness and headaches.  Endo/Heme/Allergies: Negative for environmental allergies. Does not bruise/bleed easily.       Objective:   Blood pressure 124/80, pulse 78, temperature 97.6 F (36.4 C), temperature source Temporal, resp. rate 18, height  (1.651 m), weight 225 lb (102.1 kg), SpO2 98 %. Body mass index is 37.44  kg/m.   Physical Exam:   Physical Exam Constitutional:      Appearance: She is well-developed.  HENT:     Head: Normocephalic and atraumatic.     Right Ear: Tympanic membrane, ear canal and external ear normal. No drainage, swelling or tenderness. Tympanic membrane is not injected, scarred, erythematous, retracted or bulging.     Left Ear: Tympanic membrane, ear canal and external ear normal. No drainage, swelling or tenderness. Tympanic membrane is not injected, scarred, erythematous, retracted or bulging.     Nose: No nasal deformity, septal deviation, mucosal edema, rhinorrhea or epistaxis.     Right Turbinates: Enlarged and swollen.     Left Turbinates: Enlarged and swollen.     Right Sinus: No maxillary sinus tenderness or frontal sinus tenderness.     Left Sinus: No maxillary sinus tenderness or frontal sinus tenderness.     Comments: Clear rhinorrhea bilaterally.     Mouth/Throat:     Lips: Pink.     Mouth: Oropharynx is clear and moist. Mucous membranes are moist. Mucous membranes are not pale and not dry.     Pharynx: Uvula midline.  Eyes:     General: Lids are normal.        Right eye: No discharge.        Left eye: No discharge.     Extraocular Movements: EOM normal.     Conjunctiva/sclera: Conjunctivae normal.     Right eye: Right conjunctiva is not injected. No chemosis.    Left eye: Left conjunctiva is not injected. No chemosis.    Pupils: Pupils are equal, round, and reactive to light.  Cardiovascular:     Rate and Rhythm: Normal rate and regular rhythm.     Heart sounds: Normal heart sounds.  Pulmonary:     Effort: Pulmonary effort is normal. No tachypnea, accessory muscle usage or respiratory distress.     Breath sounds: Normal breath sounds. No wheezing, rhonchi or rales.     Comments: No wheezing or crackles noted. Chest:     Chest wall: No tenderness.  Abdominal:     Tenderness: There is no abdominal tenderness. There is no guarding or rebound.   Lymphadenopathy:     Head:     Right side of head: No submandibular, tonsillar or occipital adenopathy.     Left side of head: No submandibular, tonsillar or occipital adenopathy.     Cervical: No cervical adenopathy.  Skin:    General: Skin is warm.  Capillary Refill: Capillary refill takes less than 2 seconds.     Coloration: Skin is not pale.     Findings: No abrasion, erythema, petechiae or rash. Rash is not papular, urticarial or vesicular.     Comments: No eczematous or urticarial lesions.  Neurological:     Mental Status: She is alert.  Psychiatric:        Mood and Affect: Mood and affect normal.        Behavior: Behavior is cooperative.      Diagnostic studies:   Allergy Studies:     Airborne Adult Perc - 09/09/20 1006    Time Antigen Placed 1006    Allergen Manufacturer Waynette ButteryGreer    Location Back    Number of Test 59    Panel 1 Select    1. Control-Buffer 50% Glycerol Negative    2. Control-Histamine 1 mg/ml 2+    3. Albumin saline Negative    4. Bahia 2+    5. French Southern [place] 2+    6. Johnson 2+    7. Kentucky Blue 2+    8. Meadow Fescue 2+    9. Perennial Rye 2+    10. Sweet Vernal 2+    11. Timothy Negative    12. Cocklebur Negative    13. Burweed Marshelder Negative    14. Ragweed, short Negative    15. Ragweed, Giant Negative    16. Plantain,  English Negative    17. Lamb's Quarters Negative    18. Sheep Sorrell Negative    19. Rough Pigweed Negative    20. Marsh Elder, Rough Negative    21. Mugwort, Common Negative    22. Ash mix Negative    23. Birch mix Negative    24. Beech American Negative    25. Box, Elder Negative    26. Cedar, red Negative    27. Cottonwood, Guinea-BissauEastern Negative    28. Elm mix Negative    29. Hickory Negative    30. Maple mix Negative    31. Oak, Guinea-Bissau[place]astern mix Negative    32. Pecan Pollen Negative    33. Pine mix Negative    34. Sycamore Eastern Negative    35. Walnut, Black Pollen Negative    36. Alternaria alternata Negative     37. Cladosporium Herbarum Negative    38. Aspergillus mix Negative    39. Penicillium mix Negative    40. Bipolaris sorokiniana (Helminthosporium) Negative    41. Drechslera spicifera (Curvularia) Negative    42. Mucor plumbeus Negative    43. Fusarium moniliforme Negative    44. Aureobasidium pullulans (pullulara) Negative    45. Rhizopus oryzae Negative    46. Botrytis cinera Negative    47. Epicoccum nigrum Negative    48. Phoma betae Negative    49. Candida Albicans Negative    50. Trichophyton mentagrophytes Negative    51. Mite, D Farinae  5,000 AU/ml Negative    52. Mite, D Pteronyssinus  5,000 AU/ml Negative    53. Cat Hair 10,000 BAU/ml Negative    54.  Dog Epithelia Negative    55. Mixed Feathers Negative    56. Horse Epithelia Negative    57. Cockroach, German Negative    58. Mouse Negative    59. Tobacco Leaf Negative          Intradermal - 09/09/20 1100    Time Antigen Placed 1045    Allergen Manufacturer Greer    Location Arm    Number of Test  12    Intradermal Select    Control Negative    Ragweed mix 3+    Weed mix 3+    Tree mix 3+    Mold 1 Negative    Mold 2 Negative    Mold 3 Negative    Mold 4 Negative    Cat 4+    Dog Negative    Cockroach Negative    Mite mix Negative          Food Adult Perc - 09/09/20 1000    Time Antigen Placed 1006    Allergen Manufacturer Greer    Location Back    Number of allergen test 72     Control-buffer 50% Glycerol Negative    Control-Histamine 1 mg/ml 2+    1. Peanut Negative    2. Soybean Negative    3. Wheat Negative    4. Sesame Negative    5. Milk, cow Negative    6. Egg White, Chicken Negative    7. Casein Negative    8. Shellfish Mix Negative    9. Fish Mix Negative    10. Cashew Negative    11. Pecan Food Negative    12. Walnut Food Negative    13. Almond Negative    14. Hazelnut Negative    15. [place] nut Negative    16. Coconut Negative    17. Pistachio Negative    18. Catfish  Negative    19. Bass Negative    20. Trout Negative    21. Tuna Negative    22. Salmon Negative    23. Flounder Negative    24. Codfish Negative    25. Shrimp Negative    26. Crab Negative    27. Lobster Negative    28. Oyster Negative    29. Scallops Negative    30. Barley Negative    31. Oat  Negative    32. Rye  Negative    33. Hops Negative    34. Rice Negative    35. Cottonseed Negative    36. Saccharomyces Cerevisiae  Negative    37. Pork Negative    38. Malawi Meat Negative    39. Chicken Meat Negative    40. Beef Negative    41. Lamb Negative    42. Tomato Negative    43. White Potato Negative    44. Sweet Potato Negative    45. Pea, Green/English Negative    46. Navy Bean Negative    47. Mushrooms Negative    48. Avocado Negative    49. Onion Negative    50. Cabbage Negative    51. Carrots Negative    52. Celery Negative    53. Corn Negative    54. Cucumber Negative    55. Grape (White seedless) Negative    56. Orange  Negative    57. Banana Negative    58. Apple Negative    59. Peach Negative    60. Strawberry Negative    61. Cantaloupe Negative    62. Watermelon Negative    63. Pineapple Negative    64. Chocolate/Cacao bean Negative    65. Karaya Gum Negative    66. Acacia (Arabic Gum) Negative    67. Cinnamon Negative    68. Nutmeg Negative    69. Ginger Negative    70. Garlic Negative    71. Pepper, black Negative    72. Mustard Negative           Allergy  testing results were read and interpreted by myself, documented by clinical staff.         Malachi Bonds, MD [facility]

## 2020-09-09 NOTE — Patient Instructions (Addendum)
1. Food intolerance - Testing was negative to the entire panel. - There is a the low positive predictive value of food allergy testing and hence the high possibility of false positives. - In contrast, food allergy testing has a high negative predictive value, therefore if testing is negative we can be relatively assured that they are indeed negative.  - This certainly could be related to a food intolerance. - The best way to diagnose this is with things like dietary changes and elimination diets. - Tests like Everlywell are not really patient diet at all.  - There are naturopathic doctors in [place] [facility] [place] if you are more interested in taking that route.  2. Mild intermittent asthma, uncomplicated - Lung testing looks great today. - It seems that avoiding a lot of your triggers has really improved control of your symptoms. - Continue with albuterol 4 puffs every 4-6 hours as needed.  3. Seasonal allergic rhinitis due to pollen - Testing today showed: ragweed, weeds, trees and cat - Copy of test results provided.  - Avoidance measures provided. - Continue with: Allegra D as needed - Start taking: Flonase (fluticasone) one spray per nostril daily for congestion. - You can use an extra dose of the antihistamine, if needed, for breakthrough symptoms.  - Consider nasal saline rinses 1-2 times daily to remove allergens from the nasal cavities as well as help with mucous clearance (this is especially helpful to do before the nasal sprays are given) - Consider allergy shots as a means of long-term control. - Allergy shots "re-train" and "reset" the immune system to ignore environmental allergens and decrease the resulting immune response to those allergens (sneezing, itchy watery eyes, runny nose, nasal congestion, etc).    - Allergy shots improve symptoms in 75-85% of patients.  - We can discuss more at the next appointment if the medications are not working for you.  4. Return in about 2 months  (around 11/09/2020).    Please inform us of any Emergency Department visits, hospitalizations, or changes in symptoms. Call us before going to the ED for breathing or allergy symptoms since we might be able to fit you in for a sick visit. Feel free to contact us anytime with any questions, problems, or concerns.  It was a pleasure to meet you today!  Websites that have reliable patient information: 1. American Academy of Asthma, Allergy, and Immunology: www.aaaai.org 2. Food Allergy Research and Education (FARE): foodallergy.org 3. Mothers of Asthmatics: http://www.asthmacommunitynetwork.org 4. American College of Allergy, Asthma, and Immunology: www.acaai.org   COVID-19 Vaccine Information can be found at: [email or web address] For questions related to vaccine distribution or appointments, please email [email or web address] or call [phone].   We realize that you might be concerned about having an allergic reaction to the COVID19 vaccines. To help with that concern, WE ARE OFFERING THE COVID19 VACCINES IN OUR OFFICE! Ask the front desk for dates!     "Like" [place] on Facebook and Instagram for our latest updates!      A healthy democracy works best when [employer] participate! Make sure you are registered to vote! If you have moved or changed any of your contact information, you will need to get this updated before voting!  In some cases, you MAY be able to register to vote online: [email or web address]    Reducing Pollen Exposure  The American Academy of Allergy, Asthma and Immunology suggests the following steps to reduce your exposure to pollen during allergy seasons.    1. Do not hang sheets or  clothing out to dry; pollen may collect on these items. 2. Do not mow lawns or spend time around freshly cut grass; mowing stirs up pollen. 3. Keep windows closed at night.  Keep car windows closed while  driving. 4. Minimize morning activities outdoors, a time when pollen counts are usually at their highest. 5. Stay indoors as much as possible when pollen counts or humidity is high and on windy days when pollen tends to remain in the air longer. 6. Use air conditioning when possible.  Many air conditioners have filters that trap the pollen spores. 7. Use a HEPA room air filter to remove pollen form the indoor air you breathe.  Control of Dog or Cat Allergen  Avoidance is the best way to manage a dog or cat allergy. If you have a dog or cat and are allergic to dog or cats, consider removing the dog or cat from the home. If you have a dog or cat but don't want to find it a new home, or if your family wants a pet even though someone in the household is allergic, here are some strategies that may help keep symptoms at bay:  1. Keep the pet out of your bedroom and restrict it to only a few rooms. Be advised that keeping the dog or cat in only one room will not limit the allergens to that room. 2. Don't pet, hug or kiss the dog or cat; if you do, wash your hands with soap and water. 3. High-efficiency particulate air (HEPA) cleaners run continuously in a bedroom or living room can reduce allergen levels over time. 4. Regular use of a high-efficiency vacuum cleaner or a central vacuum can reduce allergen levels. 5. Giving your dog or cat a bath at least once a week can reduce airborne allergen.

## 2020-09-19 NOTE — Progress Notes (Signed)
Referring-Erin Noralyn Pick, PA-C Reason for referral-palpitations and dizziness  HPI: 25 year old female for evaluation of palpitations and dizziness at request of Dell Ponto.  CTA October 2021 showed no pulmonary embolus.  Laboratories March 2022 showed hemoglobin 15.6, BUN 11 and creatinine 0.76, potassium 4.5, TSH 1.22.  Patient states she had Covid in October.  Over the past 1 month she has noticed an elevation in her heart rate.  With minimal activities it can increase to 150-160.  She has associated dizziness at times and and occasional chest pain over the past week when her heart rate is elevated.  She has some dyspnea on exertion but denies orthopnea, PND or pedal edema.  Her chest pain is in the left breast area and lasts approximately 1 minute to 2 minutes.  No radiation.  Not clearly exertional.  She has not had syncope.  Cardiology now asked to evaluate.  Current Outpatient Medications  Medication Sig Dispense Refill   ALBUTEROL SULFATE HFA IN Inhale 2 puffs into the lungs every 4 (four) hours as needed.     Baclofen 5 MG TABS Take 15 mg by mouth at bedtime as needed.     fluticasone (FLONASE) 50 MCG/ACT nasal spray 1 spray per nostril daily for congestion. 16 g 1   traMADol (ULTRAM) 50 MG tablet Take 50 mg by mouth every 6 (six) hours as needed.     No current facility-administered medications for this visit.    Allergies  Allergen Reactions   Penicillins Anaphylaxis    Family History    Tricyclic Antidepressants Other (See Comments)    Panic Attacks, Asthma Flare    Prednisone & Diphenhydramine Rash     Past Medical History:  Diagnosis Date   Asthma    Eczema    Fibromyalgia 2018    Past Surgical History:  Procedure Laterality Date   NASAL SEPTUM SURGERY Bilateral 2015   SINOSCOPY      Social History   Socioeconomic History   Marital status: Single    Spouse name: Not on file   Number of children: Not on file   Years of education: Not  on file   Highest education level: Not on file  Occupational History   Not on file  Tobacco Use   Smoking status: Former Smoker   Smokeless tobacco: Never Used  Building services engineer Use: Former  Substance and Sexual Activity   Alcohol use: Yes    Comment: Occasional    Drug use: Not Currently   Sexual activity: Not on file  Other Topics Concern   Not on file  Social History Narrative   Not on file   Social Determinants of Health   Financial Resource Strain: Not on file  Food Insecurity: Not on file  Transportation Needs: Not on file  Physical Activity: Not on file  Stress: Not on file  Social Connections: Not on file  Intimate Partner Violence: Not on file    Family History  Problem Relation Age of Onset   Heart attack Father    Eczema Brother    Allergic rhinitis Brother    Urticaria Brother     ROS: no fevers or chills, productive cough, hemoptysis, dysphasia, odynophagia, melena, hematochezia, dysuria, hematuria, rash, seizure activity, orthopnea, PND, pedal edema, claudication. Remaining systems are negative.  Physical Exam:   Blood pressure 120/70, pulse 81, height 5\' 5"  (1.651 m), weight 215 lb (97.5 kg).  General:  Well developed/well nourished in NAD Skin warm/dry Patient not depressed  No peripheral clubbing Back-normal HEENT-normal/normal eyelids Neck supple/normal carotid upstroke bilaterally; no bruits; no JVD; no thyromegaly chest - CTA/ normal expansion CV - RRR/normal S1 and S2; no murmurs, rubs or gallops;  PMI nondisplaced Abdomen -NT/ND, no HSM, no mass, + bowel sounds, no bruit 2+ femoral pulses, no bruits Ext-no edema, chords, 2+ DP Neuro-grossly nonfocal  ECG -electrocardiogram September 16, 2020 showed normal sinus rhythm with no ST changes.  Personally reviewed  Today's electrocardiogram shows normal sinus rhythm at a rate of 81 with no ST changes.  A/P  1 palpitations-patient describes an elevated heart rate at times.  We  will arrange an echocardiogram to assess LV function.  She notices this more with activities.  I will arrange a 3-day monitor to evaluate heart rate and to rule out significant arrhythmias.  Note her TSH and hemoglobin were normal.  2 tachycardia-outlined as above.  Olga Millers, MD

## 2020-09-22 ENCOUNTER — Ambulatory Visit (INDEPENDENT_AMBULATORY_CARE_PROVIDER_SITE_OTHER): Payer: 59

## 2020-09-22 ENCOUNTER — Ambulatory Visit (INDEPENDENT_AMBULATORY_CARE_PROVIDER_SITE_OTHER): Payer: 59 | Admitting: Cardiology

## 2020-09-22 ENCOUNTER — Telehealth: Payer: Self-pay | Admitting: *Deleted

## 2020-09-22 ENCOUNTER — Encounter: Payer: Self-pay | Admitting: Cardiology

## 2020-09-22 ENCOUNTER — Other Ambulatory Visit: Payer: Self-pay

## 2020-09-22 VITALS — BP 120/70 | HR 81 | Ht 65.0 in | Wt 215.0 lb

## 2020-09-22 DIAGNOSIS — R002 Palpitations: Secondary | ICD-10-CM

## 2020-09-22 DIAGNOSIS — R000 Tachycardia, unspecified: Secondary | ICD-10-CM

## 2020-09-22 NOTE — Telephone Encounter (Signed)
A 3 day long term holter monitor was ordered on patient.  Instructions were given for a ZIO XT patch monitor.  [employer] (ZIO) is not in network with the patients [employer].   Patient will be enrolled for [employer] to ship a  3  Day Long term holter monitor to her home.  [employer] is in network with [facility].

## 2020-09-22 NOTE — Patient Instructions (Signed)
Testing/Procedures:  Your physician has requested that you have an echocardiogram. Echocardiography is a painless test that uses sound waves to create images of your heart. It provides your doctor with information about the size and shape of your heart and how well your heart's chambers and valves are working. This procedure takes approximately one hour. There are no restrictions for this procedure.[address]  ZIO XT- Long Term Monitor Instructions   Your physician has requested you wear your ZIO patch monitor___3____days.   This is a single patch monitor.  [employer] supplies one patch monitor per enrollment.  Additional stickers are not available.   Please do not apply patch if you will be having a Nuclear Stress Test, Echocardiogram, Cardiac CT, MRI, or Chest Xray during the time frame you would be wearing the monitor. The patch cannot be worn during these tests.  You cannot remove and re-apply the ZIO XT patch monitor.   Your ZIO patch monitor will be sent USPS Priority mail from [facility] directly to your home address. The monitor may also be mailed to a PO BOX if home delivery is not available.   It may take 3-5 days to receive your monitor after you have been enrolled.   Once you have received you monitor, please review enclosed instructions.  Your monitor has already been registered assigning a specific monitor serial # to you.   Applying the monitor   Shave hair from upper left chest.   Hold abrader disc by orange tab.  Rub abrader in 40 strokes over left upper chest as indicated in your monitor instructions.   Clean area with 4 enclosed alcohol pads .  Use all pads to assure are is cleaned thoroughly.  Let dry.   Apply patch as indicated in monitor instructions.  Patch will be place under collarbone on left side of chest with arrow pointing upward.   Rub patch adhesive wings for 2 minutes.Remove white label marked "1".  Remove white label marked "2".  Rub  patch adhesive wings for 2 additional minutes.   While looking in a mirror, press and release button in center of patch.  A small green light will flash 3-4 times .  This will be your only indicator the monitor has been turned on.     Do not shower for the first 24 hours.  You may shower after the first 24 hours.   Press button if you feel a symptom. You will hear a small click.  Record Date, Time and Symptom in the Patient Log Book.   When you are ready to remove patch, follow instructions on last 2 pages of Patient Log Book.  Stick patch monitor onto last page of Patient Log Book.   Place Patient Log Book in [place] box.  Use locking tab on box and tape box closed securely.  The [employer] and [employer] has [employer] on it.  Please place in mailbox as soon as possible.  Your physician should have your test results approximately 7 days after the monitor has been mailed back to [facility].   Call [facility] Customer Care at [phone] if you have questions regarding your ZIO XT patch monitor.  Call them immediately if you see an orange light blinking on your monitor.   If your monitor falls off in less than 4 days contact our Monitor department at [phone].  If your monitor becomes loose or falls off after 4 days call [employer] at [phone] for suggestions on securing your  monitor.      Follow-Up: At [facility], you and your health needs are our priority.  As part of our continuing mission to provide you with exceptional heart care, we have created designated Provider Care Teams.  These Care Teams include your primary Cardiologist (physician) and Advanced Practice Providers (APPs -  Physician Assistants and Nurse Practitioners) who all work together to provide you with the care you need, when you need it.  We recommend signing up for the patient portal called "MyChart".  Sign up information is provided on this After Visit Summary.  MyChart is used to connect with patients for  Virtual Visits (Telemedicine).  Patients are able to view lab/test results, encounter notes, upcoming appointments, etc.  Non-urgent messages can be sent to your provider as well.   To learn more about what you can do with MyChart, go to [email or web address].    Your next appointment:   3 month(s)  The format for your next appointment:   In Person  Provider:   Olga Millers, MD

## 2020-10-04 ENCOUNTER — Encounter (HOSPITAL_COMMUNITY): Payer: Self-pay

## 2020-10-04 ENCOUNTER — Other Ambulatory Visit: Payer: Self-pay

## 2020-10-04 DIAGNOSIS — R112 Nausea with vomiting, unspecified: Secondary | ICD-10-CM | POA: Insufficient documentation

## 2020-10-04 DIAGNOSIS — R1031 Right lower quadrant pain: Secondary | ICD-10-CM | POA: Diagnosis present

## 2020-10-04 DIAGNOSIS — Z7951 Long term (current) use of inhaled steroids: Secondary | ICD-10-CM | POA: Insufficient documentation

## 2020-10-04 DIAGNOSIS — J4520 Mild intermittent asthma, uncomplicated: Secondary | ICD-10-CM | POA: Diagnosis not present

## 2020-10-04 DIAGNOSIS — Z87891 Personal history of nicotine dependence: Secondary | ICD-10-CM | POA: Diagnosis not present

## 2020-10-04 NOTE — ED Triage Notes (Signed)
RLQ pain that stated around 1pm today. Emesis today x 4.

## 2020-10-05 ENCOUNTER — Emergency Department (HOSPITAL_COMMUNITY)
Admission: EM | Admit: 2020-10-05 | Discharge: 2020-10-05 | Disposition: A | Discharge status: Home / Self Care | Payer: 59 | Attending: Emergency Medicine | Admitting: Emergency Medicine

## 2020-10-05 ENCOUNTER — Emergency Department (HOSPITAL_COMMUNITY): Payer: 59

## 2020-10-05 DIAGNOSIS — R1031 Right lower quadrant pain: Secondary | ICD-10-CM

## 2020-10-05 DIAGNOSIS — R52 Pain, unspecified: Secondary | ICD-10-CM

## 2020-10-05 LAB — CBC
HCT: 47.1 % — ABNORMAL HIGH (ref 36.0–46.0)
Hemoglobin: 15.3 g/dL — ABNORMAL HIGH (ref 12.0–15.0)
MCH: 29.1 pg (ref 26.0–34.0)
MCHC: 32.5 g/dL (ref 30.0–36.0)
MCV: 89.5 fL (ref 80.0–100.0)
Platelets: 268 10*3/uL (ref 150–400)
RBC: 5.26 MIL/uL — ABNORMAL HIGH (ref 3.87–5.11)
RDW: 13.9 % (ref 11.5–15.5)
WBC: 10.8 10*3/uL — ABNORMAL HIGH (ref 4.0–10.5)
nRBC: 0 % (ref 0.0–0.2)

## 2020-10-05 LAB — COMPREHENSIVE METABOLIC PANEL
ALT: 22 U/L (ref 0–44)
AST: 19 U/L (ref 15–41)
Albumin: 4.4 g/dL (ref 3.5–5.0)
Alkaline Phosphatase: 44 U/L (ref 38–126)
Anion gap: 10 (ref 5–15)
BUN: 12 mg/dL (ref 6–20)
CO2: 24 mmol/L (ref 22–32)
Calcium: 9.3 mg/dL (ref 8.9–10.3)
Chloride: 105 mmol/L (ref 98–111)
Creatinine, Ser: 0.7 mg/dL (ref 0.44–1.00)
GFR, Estimated: >60 mL/min (ref >60)
Glucose, Bld: 100 mg/dL — ABNORMAL HIGH (ref 70–99)
Potassium: 3.4 mmol/L — ABNORMAL LOW (ref 3.5–5.1)
Sodium: 139 mmol/L (ref 135–145)
Total Bilirubin: 0.8 mg/dL (ref 0.3–1.2)
Total Protein: 7.4 g/dL (ref 6.5–8.1)

## 2020-10-05 LAB — WET PREP, GENITAL
Clue Cells Wet Prep HPF POC: NONE SEEN
Sperm: NONE SEEN
Trich, Wet Prep: NONE SEEN
Yeast Wet Prep HPF POC: NONE SEEN

## 2020-10-05 LAB — URINALYSIS, ROUTINE W REFLEX MICROSCOPIC
Bilirubin Urine: NEGATIVE
Glucose, UA: NEGATIVE mg/dL
Hgb urine dipstick: NEGATIVE
Ketones, ur: NEGATIVE mg/dL
Leukocytes,Ua: NEGATIVE
Nitrite: NEGATIVE
Protein, ur: NEGATIVE mg/dL
Specific Gravity, Urine: 1.021 (ref 1.005–1.030)
pH: 6 (ref 5.0–8.0)

## 2020-10-05 LAB — POC URINE PREG, ED: Preg Test, Ur: NEGATIVE

## 2020-10-05 LAB — LIPASE, BLOOD: Lipase: 26 U/L (ref 11–51)

## 2020-10-05 MED ORDER — ONDANSETRON 4 MG PO TBDP: 4.0000 mg | ORAL_TABLET | Freq: Three times a day (TID) | ORAL | 0 refills | Status: DC | PRN

## 2020-10-05 MED ORDER — ONDANSETRON HCL 4 MG/2ML IJ SOLN
4.0000 mg | Freq: Once | INTRAMUSCULAR | Status: AC
  Administered 2020-10-05: 4 mg via INTRAVENOUS
  Filled 2020-10-05: qty 2

## 2020-10-05 MED ORDER — IOHEXOL 300 MG/ML  SOLN
100.0000 mL | Freq: Once | INTRAMUSCULAR | Status: AC | PRN
  Administered 2020-10-05: 100 mL via INTRAVENOUS

## 2020-10-05 MED ORDER — LACTATED RINGERS IV BOLUS
1000.0000 mL | Freq: Once | INTRAVENOUS | Status: AC
  Administered 2020-10-05: 1000 mL via INTRAVENOUS

## 2020-10-05 NOTE — ED Provider Notes (Signed)
[facility] EMERGENCY DEPARTMENT Provider Note   CSN: [id number] Arrival date & time: 10/04/20  2211     History Chief Complaint  Patient presents with  . Abdominal Pain    Katherine Mora is a 25 y.o. female.  Patient with history of fibromyalgia presenting with right lower quadrant pain with nausea and vomiting.  Symptoms started around 1 PM today and is progressively worsening.  Describes pain in her right lower quadrant with no radiation.  She vomited x4.  No pain with urination or blood in the urine.  No vaginal bleeding or discharge.  Pain has not moved as of her started.  Nothing makes it better nothing makes it worse.  She has chronic nausea at baseline but this is worse now.  Denies any possibility of pregnancy.  Denies any black or bloody stools.  Still has appendix and gallbladder  The history is provided by the patient.  Abdominal Pain Associated symptoms: nausea and vomiting   Associated symptoms: no chest pain, no cough, no dysuria, no fever, no hematuria and no shortness of breath        Past Medical History:  Diagnosis Date  . Asthma   . Eczema   . Fibromyalgia 2018    Patient Active Problem List   Diagnosis Date Noted  . Food intolerance 09/09/2020  . Seasonal allergic rhinitis due to pollen 09/09/2020  . Mild intermittent asthma, uncomplicated 09/09/2020    Past Surgical History:  Procedure Laterality Date  . NASAL SEPTUM SURGERY Bilateral 2015  . SINOSCOPY       OB History   No obstetric history on file.     Family History  Problem Relation Age of Onset  . Heart attack Father   . Eczema Brother   . Allergic rhinitis Brother   . Urticaria Brother     Social History   Tobacco Use  . Smoking status: Former Games developer  . Smokeless tobacco: Never Used  Vaping Use  . Vaping Use: Former  Substance Use Topics  . Alcohol use: Yes    Comment: Occasional   . Drug use: Not Currently    Home Medications Prior to Admission medications    Medication Sig Start Date End Date Taking? Authorizing Provider  ALBUTEROL SULFATE HFA IN Inhale 2 puffs into the lungs every 4 (four) hours as needed.    [provider]  Baclofen 5 MG TABS Take 15 mg by mouth at bedtime as needed.    [provider]  fluticasone (FLONASE) 50 MCG/ACT nasal spray 1 spray per nostril daily for congestion. 09/09/20   Alfonse Spruce, MD  traMADol (ULTRAM) 50 MG tablet Take 50 mg by mouth every 6 (six) hours as needed.    [provider]    Allergies    Penicillins, Prednisone, and Tricyclic antidepressants  Review of Systems   Review of Systems  Constitutional: Negative for activity change, appetite change and fever.  HENT: Negative for congestion and rhinorrhea.   Respiratory: Negative for cough, chest tightness and shortness of breath.   Cardiovascular: Negative for chest pain.  Gastrointestinal: Positive for abdominal pain, nausea and vomiting.  Genitourinary: Negative for dysuria and hematuria.  Musculoskeletal: Negative for back pain.  Skin: Negative for rash.  Neurological: Negative for dizziness, weakness and headaches.   all other systems are negative except as noted in the HPI and PMH.    Physical Exam Updated Vital Signs BP 128/85 (BP Location: Right Arm)   Pulse 99   Temp 98.3 F (  36.8 C)   Resp 18   Ht 5\' 5"  (1.651 m)   Wt 97.5 kg   SpO2 95%   BMI 35.78 kg/m   Physical Exam Vitals and nursing note reviewed.  Constitutional:      General: She is not in acute distress.    Appearance: She is well-developed.  HENT:     Head: Normocephalic and atraumatic.     Mouth/Throat:     Pharynx: No oropharyngeal exudate.  Eyes:     Conjunctiva/sclera: Conjunctivae normal.     Pupils: Pupils are equal, round, and reactive to light.  Neck:     Comments: No meningismus. Cardiovascular:     Rate and Rhythm: Normal rate and regular rhythm.     Heart sounds: Normal heart sounds. No murmur  heard.   Pulmonary:     Effort: Pulmonary effort is normal. No respiratory distress.     Breath sounds: Normal breath sounds.  Abdominal:     Palpations: Abdomen is soft.     Tenderness: There is abdominal tenderness. There is guarding. There is no rebound.     Comments: Right lower quadrant periumbilical tenderness, no guarding or rebound  Musculoskeletal:        General: No tenderness. Normal range of motion.     Cervical back: Normal range of motion and neck supple.     Comments: No CVAT  Skin:    General: Skin is warm.  Neurological:     Mental Status: She is alert and oriented to person, place, and time.     Cranial Nerves: No cranial nerve deficit.     Motor: No abnormal muscle tone.     Coordination: Coordination normal.     Comments:  5/5 strength throughout. CN 2-12 intact.Equal grip strength.   Psychiatric:        Behavior: Behavior normal.     ED Results / Procedures / Treatments   Labs (all labs ordered are listed, but only abnormal results are displayed) Labs Reviewed  WET PREP, GENITAL - Abnormal; Notable for the following components:      Result Value   WBC, Wet Prep HPF POC FEW (*)    All other components within normal limits  COMPREHENSIVE METABOLIC PANEL - Abnormal; Notable for the following components:   Potassium 3.4 (*)    Glucose, Bld 100 (*)    All other components within normal limits  CBC - Abnormal; Notable for the following components:   WBC 10.8 (*)    RBC 5.26 (*)    Hemoglobin 15.3 (*)    HCT 47.1 (*)    All other components within normal limits  LIPASE, BLOOD  URINALYSIS, ROUTINE W REFLEX MICROSCOPIC  POC URINE PREG, ED  I-STAT BETA HCG BLOOD, ED ([facility], [facility], [facility] ONLY)  GC/CHLAMYDIA PROBE AMP (Jette) NOT AT [facility]    EKG EKG Interpretation  Date/Time:  Monday October 05 2020 00:49:39 EDT Ventricular Rate:  79 PR Interval:  128 QRS Duration: 97 QT Interval:  368 QTC Calculation: 422 R Axis:   71 Text Interpretation: Sinus rhythm  No previous ECGs available Confirmed by [id number] [phone]) on 10/05/2020 1:05:48 AM   Radiology CT ABDOMEN PELVIS W CONTRAST  Result Date: 10/05/2020 CLINICAL DATA:  Right lower quadrant pain EXAM: CT ABDOMEN AND PELVIS WITH CONTRAST TECHNIQUE: Multidetector CT imaging of the abdomen and pelvis was performed using the standard protocol following bolus administration of intravenous contrast. CONTRAST:  12/05/2020 OMNIPAQUE IOHEXOL 300 MG/ML  SOLN COMPARISON:  Ultrasound  04/16/2020 FINDINGS: Lower chest: Dependent atelectasis. Hepatobiliary: No focal hepatic abnormality. Gallbladder unremarkable. Pancreas: No focal abnormality or ductal dilatation. Spleen: No focal abnormality.  Normal size. Adrenals/Urinary Tract: Punctate nonobstructing stone in the midpole of the right kidney. No ureteral stones or hydronephrosis. Adrenal glands and urinary bladder unremarkable. Stomach/Bowel: Normal appendix. Stomach, large and small bowel grossly unremarkable. Vascular/Lymphatic: No evidence of aneurysm or adenopathy. Reproductive: Uterus and adnexa unremarkable.  No mass. Other: No free fluid or free air. Musculoskeletal: No acute bony abnormality. IMPRESSION: Normal appendix. Punctate right renal midpole stone. No ureteral stones or hydronephrosis. No acute findings in the abdomen or pelvis. Electronically Signed   By: Charlett Nose M.D.   On: 10/05/2020 02:29   US PELVIC COMPLETE W TRANSVAGINAL AND TORSION R/O  Result Date: 10/05/2020 CLINICAL DATA:  Initial evaluation for acute right lower quadrant pain. EXAM: TRANSABDOMINAL AND TRANSVAGINAL ULTRASOUND OF PELVIS DOPPLER ULTRASOUND OF OVARIES TECHNIQUE: Both transabdominal and transvaginal ultrasound examinations of the pelvis were performed. Transabdominal technique was performed for global imaging of the pelvis including uterus, ovaries, adnexal regions, and pelvic cul-de-sac. It was necessary to proceed with endovaginal exam following the transabdominal exam to  visualize the uterus, endometrium, and ovaries. Color and duplex Doppler ultrasound was utilized to evaluate blood flow to the ovaries. COMPARISON:  Prior CT from earlier the same day. FINDINGS: Uterus Measurements: 5.2 x 2.9 x 4.0 cm = volume: 31.5 mL. Uterus is anteverted. No discrete fibroid or other mass. Endometrium Thickness: 1.1 mm.  No focal abnormality visualized. Right ovary Measurements: 2.2 x 1.6 x 3.1 cm = volume: 6.8 mL. Normal appearance/no adnexal mass. Left ovary Measurements: 3.1 x 2.3 x 2.8 cm = volume: 10.2 mL. Normal appearance/no adnexal mass. Pulsed Doppler evaluation of both ovaries demonstrates normal low-resistance arterial and venous waveforms. Other findings No abnormal free fluid. IMPRESSION: Normal pelvic ultrasound. No evidence for torsion or other acute abnormality. Electronically Signed   By: Rise Mu M.D.   On: 10/05/2020 03:55    Procedures Procedures   Medications Ordered in ED Medications  lactated ringers bolus 1,000 mL (has no administration in time range)  ondansetron (ZOFRAN) injection 4 mg (has no administration in time range)    ED Course  I have reviewed the triage vital signs and the nursing notes.  Pertinent labs & imaging results that were available during my care of the patient were reviewed by me and considered in my medical decision making (see chart for details).    MDM Rules/Calculators/A&P                          Right lower quadrant pain with nausea and vomiting.  Consider appendicitis, kidney stone, ovarian pathology  hCG is negative.  Urinalysis is negative.  Pelvic exam is benign but has some adnexal tenderness. No leukocytosis.  CT scan obtained to evaluate for appendicitis  CT scan negative for appendicitis or other acute pathology.  Ultrasound will be performed to rule out right-sided ovarian torsion.  Ultrasound negative for ovarian torsion or other acute pathology.  Urinalysis is negative. CT scan did show some  punctate right renal stones.  Question recently passed kidney stone versus early appendicitis.  Discussed with patient today that her appendix appears normal but sometimes appendicitis is not seen right away on imaging.  Discussed symptom control and outpatient follow-up.  Return to the ED with worsening pain especially in the right lower abdomen, persistent fever, persistent vomiting or any other concerns.  Final Clinical Impression(s) / ED Diagnoses Final diagnoses:  RLQ abdominal pain    Rx / DC Orders ED Discharge Orders    None       Alicea Wente, Jeannett SeniorStephen, MD 10/05/20 [id number]

## 2020-10-05 NOTE — Discharge Instructions (Signed)
Your testing today shows a normal appendix as well as normal ovary. You may have passed a kidney stone. As we discussed, early appendicitis is still possible and sometimes does not appear on CT scan right away.  You should return to the ED with worsening pain, fever, persistent vomiting, not able to eat or drink or other concerns.

## 2020-10-06 LAB — GC/CHLAMYDIA PROBE AMP (~~LOC~~) NOT AT ARMC
Chlamydia: NEGATIVE
Comment: NEGATIVE
Comment: NORMAL
Neisseria Gonorrhea: NEGATIVE

## 2020-10-07 ENCOUNTER — Other Ambulatory Visit: Payer: Self-pay

## 2020-10-09 ENCOUNTER — Other Ambulatory Visit: Payer: Self-pay

## 2020-10-09 ENCOUNTER — Ambulatory Visit (HOSPITAL_COMMUNITY)
Admission: RE | Admit: 2020-10-09 | Discharge: 2020-10-09 | Disposition: A | Discharge status: Home / Self Care | Payer: 59 | Source: Ambulatory Visit | Attending: Cardiology | Admitting: Cardiology

## 2020-10-09 DIAGNOSIS — R000 Tachycardia, unspecified: Secondary | ICD-10-CM | POA: Diagnosis present

## 2020-10-09 DIAGNOSIS — I371 Nonrheumatic pulmonary valve insufficiency: Secondary | ICD-10-CM

## 2020-10-09 DIAGNOSIS — R002 Palpitations: Secondary | ICD-10-CM | POA: Diagnosis not present

## 2020-10-09 LAB — ECHOCARDIOGRAM COMPLETE
Area-P 1/2: 3.63 cm2
S' Lateral: 3 cm

## 2020-10-09 NOTE — Progress Notes (Signed)
*  PRELIMINARY RESULTS* Echocardiogram 2D Echocardiogram has been performed.  Katherine Mora 10/09/2020, 12:30 PM

## 2020-10-11 DIAGNOSIS — R000 Tachycardia, unspecified: Secondary | ICD-10-CM | POA: Diagnosis not present

## 2020-10-11 DIAGNOSIS — R002 Palpitations: Secondary | ICD-10-CM | POA: Diagnosis not present

## 2020-11-11 ENCOUNTER — Other Ambulatory Visit: Payer: Self-pay | Admitting: Physician Assistant

## 2020-11-11 ENCOUNTER — Ambulatory Visit (HOSPITAL_COMMUNITY): Payer: 59

## 2020-11-11 DIAGNOSIS — M7989 Other specified soft tissue disorders: Secondary | ICD-10-CM

## 2020-11-11 DIAGNOSIS — M79605 Pain in left leg: Secondary | ICD-10-CM

## 2020-11-12 ENCOUNTER — Ambulatory Visit (HOSPITAL_COMMUNITY)
Admission: RE | Admit: 2020-11-12 | Discharge: 2020-11-12 | Disposition: A | Discharge status: Home / Self Care | Payer: 59 | Source: Ambulatory Visit | Attending: Physician Assistant | Admitting: Physician Assistant

## 2020-11-12 DIAGNOSIS — M7989 Other specified soft tissue disorders: Secondary | ICD-10-CM | POA: Diagnosis present

## 2020-11-12 DIAGNOSIS — M79605 Pain in left leg: Secondary | ICD-10-CM | POA: Diagnosis present

## 2020-11-27 ENCOUNTER — Encounter: Payer: Self-pay | Admitting: *Deleted

## 2020-12-29 ENCOUNTER — Ambulatory Visit: Payer: 59 | Admitting: Cardiology

## 2020-12-29 NOTE — Progress Notes (Signed)
Cardiology Clinic Note   Patient Name: Katherine Mora Date of Encounter: 12/31/2020  Primary Care Provider:  [facility] Primary Cardiologist:  Olga Millers, MD  Patient Profile    Katherine Mora 25 year old female presents to the clinic today for follow-up evaluation of her palpitations and dizziness.  Past Medical History    Past Medical History:  Diagnosis Date   Asthma    Eczema    Fibromyalgia 2018   Past Surgical History:  Procedure Laterality Date   NASAL SEPTUM SURGERY Bilateral 2015   SINOSCOPY      Allergies  Allergies  Allergen Reactions   Mometasone Furo-Formoterol Fum     Other reaction(s): Asthma and/or Shortness of Breath, Nausea and/or Vomiting   Penicillins Anaphylaxis    Family History    Prednisone     Anxiety; rash    Tricyclic Antidepressants Other (See Comments)    Panic Attacks, Asthma Flare    Venlafaxine Hcl Other (See Comments)    Blurred vision, feeling faint   Clindamycin Rash    History of Present Illness    Katherine Mora has a PMH of seasonal allergic rhinitis, intermittent mild asthma, palpitations, dizziness, and food intolerance.  She was seen by Dr. Jens Som on 09/22/2020.  During that time she complained of palpitations and dizziness.  She was referred by her PCP.  CTA 10/21 showed no pulmonary embolus.  Her laboratory studies from 3/22 showed an hemoglobin of 15.6, BUN 11, creatinine 0.76, potassium 4.5 TSH 1.22.  She reported she had COVID-19 infection October 2021.  She reported that over the past month she noticed an elevation in her heart rate.  With minimal activity her heart rate would increase to 150-160.  She did have associated dizziness at times with occasional chest pain over the past week and a heart rate that was elevated.  She reported some dyspnea on exertion but denied orthopnea, PND, and lower extremity edema.  She reported chest pain in her left breast area that lasted approximately 1-2  minutes.  She denied radiation and had no clear exertion related pain.  She denied syncope.  A cardiac event monitor was ordered and showed sinus, sinus tachycardia, and rare PVCs.  Her symptoms were associated with sinus rhythm and sinus tachycardia with PVCs.  Metoprolol succinate 25 mg daily was recommended if she continues to have symptoms.  She presents to the clinic today for follow-up evaluation states she continues to notice palpitations several times throughout the day.  She has recently bought a new smart watch which alerts her when her heart rate increases.  During her exam she noted to alerts with both heart rates being in the 90s.  We reviewed normal heart rate between 60 and 100.  She notes that she did not have any symptoms of palpitations or tachycardia until after she had COVID-19.  I reviewed the benefits that she may have with adding metoprolol to her medication regimen.  She expressed understanding.  I will give her the triggers for palpitations, have her increase her physical activity, and follow-up in 2 to 3 months.  Today she is not having chest pain,  lower extremity edema,   melena, hematuria, hemoptysis, diaphoresis, weakness, presyncope, syncope, orthopnea, and PND.   Home Medications    Prior to Admission medications   Medication Sig Start Date End Date Taking? Authorizing Provider  ALBUTEROL SULFATE HFA IN Inhale 2 puffs into the lungs every 4 (four) hours as needed.    [provider]  Baclofen  5 MG TABS Take 15 mg by mouth at bedtime as needed.    [provider]  fluticasone (FLONASE) 50 MCG/ACT nasal spray 1 spray per nostril daily for congestion. 09/09/20   Alfonse Spruce, MD  ondansetron (ZOFRAN ODT) 4 MG disintegrating tablet Take 1 tablet (4 mg total) by mouth every 8 (eight) hours as needed for nausea or vomiting. 10/05/20   Rancour, Jeannett Senior, MD  traMADol (ULTRAM) 50 MG tablet Take 50 mg by mouth every 6 (six) hours as needed.    [provider]    Family History    Family History  Problem Relation Age of Onset   Heart attack Father    Eczema Brother    Allergic rhinitis Brother    Urticaria Brother    She indicated that her mother is alive. She indicated that her father is alive. She indicated that her brother is alive.  Social History    Social History   Socioeconomic History   Marital status: Single    Spouse name: Not on file   Number of children: Not on file   Years of education: Not on file   Highest education level: Not on file  Occupational History   Not on file  Tobacco Use   Smoking status: Former    Pack years: 0.00   Smokeless tobacco: Never  Vaping Use   Vaping Use: Former  Substance and Sexual Activity   Alcohol use: Yes    Comment: Occasional    Drug use: Not Currently   Sexual activity: Not on file  Other Topics Concern   Not on file  Social History Narrative   Not on file   Social Determinants of Health   Financial Resource Strain: Not on file  Food Insecurity: Not on file  Transportation Needs: Not on file  Physical Activity: Not on file  Stress: Not on file  Social Connections: Not on file  Intimate Partner Violence: Not on file     Review of Systems    General:  No chills, fever, night sweats or weight changes.  Cardiovascular:  No chest pain, dyspnea on exertion, edema, orthopnea, palpitations, paroxysmal nocturnal dyspnea. Dermatological: No rash, lesions/masses Respiratory: No cough, dyspnea Urologic: No hematuria, dysuria Abdominal:   No nausea, vomiting, diarrhea, bright red blood per rectum, melena, or hematemesis Neurologic:  No visual changes, wkns, changes in mental status. All other systems reviewed and are otherwise negative except as noted above.  Physical Exam    VS:  BP 110/76 (BP Location: Left Arm)   Pulse 96   Ht 5\' 5"  (1.651 m)   Wt 203 lb 12.8 oz (92.4 kg)   SpO2 98%   BMI 33.91 kg/m  , BMI Body mass index is 33.91 kg/m. GEN: Well  nourished, well developed, in no acute distress. HEENT: normal. Neck: Supple, no JVD, carotid bruits, or masses. Cardiac: RRR, no murmurs, rubs, or gallops. No clubbing, cyanosis, edema.  Radials/DP/PT 2+ and equal bilaterally.  Respiratory:  Respirations regular and unlabored, clear to auscultation bilaterally. GI: Soft, nontender, nondistended, BS + x 4. MS: no deformity or atrophy. Skin: warm and dry, no rash. Neuro:  Strength and sensation are intact. Psych: Normal affect.  Accessory Clinical Findings    Recent Labs: 10/05/2020: ALT 22; BUN 12; Creatinine, Ser 0.70; Hemoglobin 15.3; Platelets 268; Potassium 3.4; Sodium 139   Recent Lipid Panel No results found for: CHOL, TRIG, HDL, CHOLHDL, VLDL, LDLCALC, LDLDIRECT  ECG personally reviewed by me today-none today.  Cardiac event monitor 11/24/2020 Sinus to sinus tachycardia with rare PVC. Symptoms associated with sinus rhythm and sinus tachycardia with PVC. Olga Millers, MD  Assessment & Plan   1.  Palpitations-reports continued occasional episodes of palpitations that last 1-2 minutes.  Cardiac event monitor 11/24/2020 showed sinus rhythm and sinus tachycardia with rare PVCs. Start metoprolol succinate 25 mg daily Avoid triggers caffeine, chocolate, EtOH, dehydration etc. Heart healthy low-sodium diet Increase physical activity as tolerated  Tachycardia-heart rate today 96 .  Reports occasional episodes of increased heart rate.  TSH and hemoglobin unremarkable. Start metoprolol succinate 25 mg daily Increase physical activity as tolerated  Disposition: Follow-up with Dr. Jens Som or me in 2-3 months.   Thomasene Ripple. Samyrah Bruster NP-C    12/31/2020, 2:11 PM [facility] [address] Office [phone] Fax [phone]  Notice: This dictation was prepared with Dragon dictation along with smaller phrase technology. Any transcriptional errors that result from this process are unintentional  and may not be corrected upon review.  I spent 13 minutes examining this patient, reviewing medications, and using patient centered shared decision making involving her cardiac care.  Prior to her visit I spent greater than 20 minutes reviewing her past medical history,  medications, and prior cardiac tests.

## 2020-12-31 ENCOUNTER — Encounter: Payer: Self-pay | Admitting: General Practice

## 2020-12-31 ENCOUNTER — Other Ambulatory Visit: Payer: Self-pay

## 2020-12-31 ENCOUNTER — Ambulatory Visit (INDEPENDENT_AMBULATORY_CARE_PROVIDER_SITE_OTHER): Payer: 59 | Admitting: General Practice

## 2020-12-31 VITALS — BP 110/76 | HR 96 | Ht 65.0 in | Wt 203.8 lb

## 2020-12-31 DIAGNOSIS — R002 Palpitations: Secondary | ICD-10-CM

## 2020-12-31 DIAGNOSIS — R000 Tachycardia, unspecified: Secondary | ICD-10-CM

## 2020-12-31 MED ORDER — METOPROLOL SUCCINATE ER 25 MG PO TB24: 25.0000 mg | ORAL_TABLET | Freq: Every day | ORAL | 3 refills | Status: DC

## 2020-12-31 NOTE — Patient Instructions (Addendum)
Medication Instructions:  START METOPROLOL 25MG  DAILY *If you need a refill on your cardiac medications before your next appointment, please call your pharmacy*  Lab Work:   Testing/Procedures:  NONE    NONE  Special Instructions PLEASE READ AND FOLLOW SALTY 6-ATTACHED-1,800mg  daily  PLEASE INCREASE PHYSICAL ACTIVITY 30 MINUTES DAILY-AS TOLERATED  Please try to avoid these triggers: Do not use any products that have nicotine or tobacco in them. These include cigarettes, e-cigarettes, and chewing tobacco. If you need help quitting, ask your doctor. Eat heart-healthy foods. Talk with your doctor about the right eating plan for you. Exercise regularly as told by your doctor. Stay hydrated Do not drink alcohol, Caffeine or chocolate. Lose weight if you are overweight. Do not use drugs, including cannabis    Follow-Up: Your next appointment:  2 month(s) In Person with , MD OR IF UNAVAILABLE JESSE CLEAVER, FNP-C  At [facility], you and your health needs are our priority.  As part of our continuing mission to provide you with exceptional heart care, we have created designated Provider Care Teams.  These Care Teams include your primary Cardiologist (physician) and Advanced Practice Providers (APPs -  Physician Assistants and Nurse Practitioners) who all work together to provide you with the care you need, when you need it.            6 SALTY THINGS TO AVOID     1,800MG  DAILY

## 2021-01-17 ENCOUNTER — Encounter: Payer: Self-pay | Admitting: Emergency Medicine

## 2021-01-17 ENCOUNTER — Ambulatory Visit
Admission: EM | Admit: 2021-01-17 | Discharge: 2021-01-17 | Disposition: A | Discharge status: Home / Self Care | Payer: 59 | Attending: Family Medicine | Admitting: Family Medicine

## 2021-01-17 DIAGNOSIS — J069 Acute upper respiratory infection, unspecified: Secondary | ICD-10-CM | POA: Diagnosis not present

## 2021-01-17 DIAGNOSIS — J45901 Unspecified asthma with (acute) exacerbation: Secondary | ICD-10-CM

## 2021-01-17 MED ORDER — PULMICORT FLEXHALER 90 MCG/ACT IN AEPB: 2.0000 | INHALATION_SPRAY | Freq: Two times a day (BID) | RESPIRATORY_TRACT | 0 refills | Status: DC

## 2021-01-17 MED ORDER — PROMETHAZINE-DM 6.25-15 MG/5ML PO SYRP: 5.0000 mL | ORAL_SOLUTION | Freq: Four times a day (QID) | ORAL | 0 refills | Status: DC | PRN

## 2021-01-17 MED ORDER — ALBUTEROL SULFATE HFA 108 (90 BASE) MCG/ACT IN AERS
2.0000 | INHALATION_SPRAY | Freq: Once | RESPIRATORY_TRACT | Status: AC
  Administered 2021-01-17: 2 via RESPIRATORY_TRACT

## 2021-01-17 NOTE — Discharge Instructions (Addendum)
Your COVID 19 results should result within 2-5 days. Please activate your MyChart to view your results. Negative results are immediately resulted to Mychart. Positive results will receive a follow-up call from our clinic. If symptoms are present, I recommend home quarantine until results are known.  Alternate Tylenol and ibuprofen as needed for body aches and fever.  Symptom management per recommendations discussed today.  If any breathing difficulty or chest pain develops go immediately to the closest emergency department for evaluation.

## 2021-01-17 NOTE — ED Provider Notes (Signed)
[facility]    CSN: [id number] Arrival date & time: 01/17/21  8182      History   Chief Complaint Chief Complaint  Patient presents with   Cough    HPI Katherine Mora is a 25 y.o. female.   HPI Patient presents with URI symptoms including cough SOB,  nasal congestion, and sinus pressure. Unknown of COVID exposure. Has asthma and lost inhaler. Symptoms present x 1 day. Denies wheezing, generalized weakness,dizziness, or N&V.  Past Medical History:  Diagnosis Date   Asthma    Eczema    Fibromyalgia 2018    Patient Active Problem List   Diagnosis Date Noted   Food intolerance 09/09/2020   Seasonal allergic rhinitis due to pollen 09/09/2020   Mild intermittent asthma, uncomplicated 09/09/2020    Past Surgical History:  Procedure Laterality Date   NASAL SEPTUM SURGERY Bilateral 2015   SINOSCOPY      OB History   No obstetric history on file.      Home Medications    Prior to Admission medications   Medication Sig Start Date End Date Taking? Authorizing Provider  ALBUTEROL SULFATE HFA IN Inhale 2 puffs into the lungs every 4 (four) hours as needed.   Yes [provider]  Baclofen 5 MG TABS Take 15 mg by mouth at bedtime as needed.   Yes [provider]  Budesonide (PULMICORT FLEXHALER) 90 MCG/ACT inhaler Inhale 2 puffs into the lungs 2 (two) times daily. 01/17/21  Yes Bing Neighbors, FNP  promethazine-dextromethorphan (PROMETHAZINE-DM) 6.25-15 MG/5ML syrup Take 5 mLs by mouth 4 (four) times daily as needed for cough. 01/17/21  Yes Bing Neighbors, FNP  metoprolol succinate (TOPROL XL) 25 MG 24 hr tablet Take 1 tablet (25 mg total) by mouth daily. 12/31/20   Ronney Asters, NP  ondansetron (ZOFRAN ODT) 4 MG disintegrating tablet Take 1 tablet (4 mg total) by mouth every 8 (eight) hours as needed for nausea or vomiting. 10/05/20   Rancour, Jeannett Senior, MD  traMADol (ULTRAM) 50 MG tablet Take 50 mg by mouth every 6 (six) hours as  needed.    [provider]    Family History Family History  Problem Relation Age of Onset   Heart attack Father    Eczema Brother    Allergic rhinitis Brother    Urticaria Brother     Social History Social History   Tobacco Use   Smoking status: Former   Smokeless tobacco: Never  Building services engineer Use: Former  Substance Use Topics   Alcohol use: Yes    Comment: Occasional    Drug use: Not Currently     Allergies   Mometasone furo-formoterol fum, Penicillins, Prednisone, Tricyclic antidepressants, Venlafaxine hcl, and Clindamycin   Review of Systems Review of Systems Pertinent negatives listed in HPI   Physical Exam Triage Vital Signs ED Triage Vitals  Enc Vitals Group     BP 01/17/21 0957 129/76     Pulse Rate 01/17/21 0957 87     Resp 01/17/21 0957 17     Temp 01/17/21 0957 98.4 F (36.9 C)     Temp Source 01/17/21 0957 Oral     SpO2 01/17/21 0957 96 %     Weight --      Height --      Head Circumference --      Peak Flow --      Pain Score 01/17/21 0958 9     Pain Loc --  Pain Edu? --      Excl. in GC? --    No data found.  Updated Vital Signs BP 129/76 (BP Location: Right Arm)   Pulse 87   Temp 98.4 F (36.9 C) (Oral)   Resp 17   SpO2 96%   Visual Acuity Right Eye Distance:   Left Eye Distance:   Bilateral Distance:    Right Eye Near:   Left Eye Near:    Bilateral Near:     Physical Exam  General Appearance:    Alert, cooperative, no distress  HENT:   Normocephalic, ears normal, nares mucosal edema with congestion, rhinorrhea, oropharynx clear    Eyes:    PERRL, conjunctiva/corneas clear, EOM's intact       Lungs:     Clear to auscultation bilaterally, respirations unlabored  Heart:    Regular rate and rhythm  Neurologic:   Awake, alert, oriented x 3. No apparent focal neurological           deficit.      UC Treatments / Results  Labs (all labs ordered are listed, but only abnormal results are displayed) Labs  Reviewed  COVID-19, FLU A+B NAA    EKG   Radiology No results found.  Procedures Procedures (including critical care time)  Medications Ordered in UC Medications  albuterol (VENTOLIN HFA) 108 (90 Base) MCG/ACT inhaler 2 puff (2 puffs Inhalation Given 01/17/21 1056)    Initial Impression / Assessment and Plan / UC Course  I have reviewed the triage vital signs and the nursing notes.  Pertinent labs & imaging results that were available during my care of the patient were reviewed by me and considered in my medical decision making (see chart for details).    COVID/Flu test pending. Symptom management warranted only.  Manage fever with Tylenol and ibuprofen.  Nasal symptoms with over-the-counter antihistamines recommended.  Treatment per discharge medications/discharge instructions.  Red flags/ER precautions given. The most current CDC isolation/quarantine recommendation advised.   Final Clinical Impressions(s) / UC Diagnoses   Final diagnoses:  Viral URI with cough  Moderate asthma with acute exacerbation, unspecified whether persistent     Discharge Instructions      Your COVID 19 results should result within 2-5 days. Please activate your MyChart to view your results. Negative results are immediately resulted to Mychart. Positive results will receive a follow-up call from our clinic. If symptoms are present, I recommend home quarantine until results are known.  Alternate Tylenol and ibuprofen as needed for body aches and fever.  Symptom management per recommendations discussed today.  If any breathing difficulty or chest pain develops go immediately to the closest emergency department for evaluation.      ED Prescriptions     Medication Sig Dispense Auth. Provider   Budesonide (PULMICORT FLEXHALER) 90 MCG/ACT inhaler Inhale 2 puffs into the lungs 2 (two) times daily. 1 each Bing Neighbors, FNP   promethazine-dextromethorphan (PROMETHAZINE-DM) 6.25-15 MG/5ML syrup Take  5 mLs by mouth 4 (four) times daily as needed for cough. 140 mL Bing Neighbors, FNP      PDMP not reviewed this encounter.   Bing Neighbors, FNP 01/17/21 1058

## 2021-01-17 NOTE — ED Triage Notes (Addendum)
Cough and pain in LT side of chest when coughing that started yesterday morning. Pt states she is worried she may have PNE again.  2 neg at home covid test one done this morning

## 2021-01-19 LAB — COVID-19, FLU A+B NAA
Influenza A, NAA: NOT DETECTED
Influenza B, NAA: NOT DETECTED
SARS-CoV-2, NAA: NOT DETECTED

## 2021-02-08 ENCOUNTER — Ambulatory Visit
Admission: EM | Admit: 2021-02-08 | Discharge: 2021-02-08 | Disposition: A | Discharge status: Home / Self Care | Payer: 59 | Attending: Family Medicine | Admitting: Family Medicine

## 2021-02-08 ENCOUNTER — Other Ambulatory Visit: Payer: Self-pay

## 2021-02-08 ENCOUNTER — Encounter: Payer: Self-pay | Admitting: Emergency Medicine

## 2021-02-08 DIAGNOSIS — J029 Acute pharyngitis, unspecified: Secondary | ICD-10-CM

## 2021-02-08 DIAGNOSIS — Z760 Encounter for issue of repeat prescription: Secondary | ICD-10-CM | POA: Diagnosis not present

## 2021-02-08 DIAGNOSIS — H60391 Other infective otitis externa, right ear: Secondary | ICD-10-CM

## 2021-02-08 MED ORDER — CIPRO HC 0.2-1 % OT SUSP: 3.0000 [drp] | Freq: Two times a day (BID) | OTIC | 0 refills | Status: DC

## 2021-02-08 MED ORDER — TRIAMCINOLONE ACETONIDE 0.025 % EX OINT: 1.0000 "application " | TOPICAL_OINTMENT | Freq: Two times a day (BID) | CUTANEOUS | 0 refills | Status: DC

## 2021-02-08 MED ORDER — LIDOCAINE VISCOUS HCL 2 % MT SOLN: 15.0000 mL | OROMUCOSAL | 0 refills | Status: DC | PRN

## 2021-02-08 NOTE — ED Triage Notes (Signed)
Right ear pain down to throat that started yesterday.  Can't hear well out of that hear and feels like a lot of pressure.

## 2021-02-08 NOTE — ED Provider Notes (Signed)
[facility]    CSN: [id number] Arrival date & time: 02/08/21  1040      History   Chief Complaint No chief complaint on file.   HPI Katherine Mora is a 25 y.o. female.   HPI Patient presents today for evaluation of right ear pain, swelling, and ear pressure and sore throat.  Denies any other associated URI symptoms.  Denies any history of recurrent ear pain. No fever or ear drainage. Throat pain started as scratchy x 1 day and throat is more pain today. Past Medical History:  Diagnosis Date   Asthma    Eczema    Fibromyalgia 2018    Patient Active Problem List   Diagnosis Date Noted   Food intolerance 09/09/2020   Seasonal allergic rhinitis due to pollen 09/09/2020   Mild intermittent asthma, uncomplicated 09/09/2020    Past Surgical History:  Procedure Laterality Date   NASAL SEPTUM SURGERY Bilateral 2015   SINOSCOPY      OB History   No obstetric history on file.      Home Medications    Prior to Admission medications   Medication Sig Start Date End Date Taking? Authorizing Provider  ciprofloxacin-hydrocortisone (CIPRO HC) OTIC suspension Place 3 drops into the left ear 2 (two) times daily for 7 days. 02/08/21 02/15/21 Yes Bing Neighbors, FNP  lidocaine (XYLOCAINE) 2 % solution Use as directed 15 mLs in the mouth or throat as needed for mouth pain. 02/08/21  Yes Bing Neighbors, FNP  triamcinolone (KENALOG) 0.025 % ointment Apply 1 application topically 2 (two) times daily. 02/08/21  Yes Bing Neighbors, FNP  ALBUTEROL SULFATE HFA IN Inhale 2 puffs into the lungs every 4 (four) hours as needed.    [provider]  Baclofen 5 MG TABS Take 15 mg by mouth at bedtime as needed.    [provider]  Budesonide (PULMICORT FLEXHALER) 90 MCG/ACT inhaler Inhale 2 puffs into the lungs 2 (two) times daily. 01/17/21   Bing Neighbors, FNP  metoprolol succinate (TOPROL XL) 25 MG 24 hr tablet Take 1 tablet (25 mg total) by mouth daily.  12/31/20   Ronney Asters, NP  ondansetron (ZOFRAN ODT) 4 MG disintegrating tablet Take 1 tablet (4 mg total) by mouth every 8 (eight) hours as needed for nausea or vomiting. 10/05/20   Rancour, Jeannett Senior, MD  promethazine-dextromethorphan (PROMETHAZINE-DM) 6.25-15 MG/5ML syrup Take 5 mLs by mouth 4 (four) times daily as needed for cough. 01/17/21   Bing Neighbors, FNP  traMADol (ULTRAM) 50 MG tablet Take 50 mg by mouth every 6 (six) hours as needed.    [provider]    Family History Family History  Problem Relation Age of Onset   Heart attack Father    Eczema Brother    Allergic rhinitis Brother    Urticaria Brother     Social History Social History   Tobacco Use   Smoking status: Former   Smokeless tobacco: Never  Building services engineer Use: Former  Substance Use Topics   Alcohol use: Yes    Comment: Occasional    Drug use: Not Currently     Allergies   Mometasone furo-formoterol fum, Penicillins, Prednisone, Tricyclic antidepressants, Venlafaxine hcl, and Clindamycin   Review of Systems Review of Systems Pertinent negatives listed in HPI  Physical Exam Triage Vital Signs ED Triage Vitals  Enc Vitals Group     BP 02/08/21 1131 115/80     Pulse Rate 02/08/21 1131 97  Resp 02/08/21 1131 16     Temp 02/08/21 1131 98.4 F (36.9 C)     Temp Source 02/08/21 1131 Oral     SpO2 02/08/21 1131 97 %     Weight --      Height --      Head Circumference --      Peak Flow --      Pain Score 02/08/21 1134 6     Pain Loc --      Pain Edu? --      Excl. in GC? --    No data found.  Updated Vital Signs BP 115/80 (BP Location: Right Arm)   Pulse 97   Temp 98.4 F (36.9 C) (Oral)   Resp 16   SpO2 97%   Visual Acuity Right Eye Distance:   Left Eye Distance:   Bilateral Distance:    Right Eye Near:   Left Eye Near:    Bilateral Near:     Physical Exam General Appearance:    Alert, cooperative, no distress  HENT:   right TM red, dull, bulging,  right TM fluid noted, and pharynx erythematous without exudate  Eyes:    PERRL, conjunctiva/corneas clear, EOM's intact       Lungs:     Clear to auscultation bilaterally, respirations unlabored  Heart:    Regular rate and rhythm  Neurologic:   Awake, alert, oriented x 3. No apparent focal neurological           defect.         UC Treatments / Results  Labs (all labs ordered are listed, but only abnormal results are displayed) Labs Reviewed - No data to display  EKG   Radiology No results found.  Procedures Procedures (including critical care time)  Medications Ordered in UC Medications - No data to display  Initial Impression / Assessment and Plan / UC Course  I have reviewed the triage vital signs and the nursing notes.  Pertinent labs & imaging results that were available during my care of the patient were reviewed by me and considered in my medical decision making (see chart for details).    Otitis externa (right, per discharge instruction. Sore throat, lidocaine Prn  Refilled triamcinolone.  RTC as needed Final Clinical Impressions(s) / UC Diagnoses   Final diagnoses:  Medication refill  Infective otitis externa of right ear   Discharge Instructions   None    ED Prescriptions     Medication Sig Dispense Auth. Provider   lidocaine (XYLOCAINE) 2 % solution Use as directed 15 mLs in the mouth or throat as needed for mouth pain. 50 mL Bing Neighbors, FNP   triamcinolone (KENALOG) 0.025 % ointment Apply 1 application topically 2 (two) times daily. 454 g Bing Neighbors, FNP   ciprofloxacin-hydrocortisone (CIPRO HC) OTIC suspension Place 3 drops into the left ear 2 (two) times daily for 7 days. 2.1 mL Bing Neighbors, FNP      PDMP not reviewed this encounter.   Bing Neighbors, FNP 02/14/21 2123

## 2021-02-15 ENCOUNTER — Ambulatory Visit (INDEPENDENT_AMBULATORY_CARE_PROVIDER_SITE_OTHER): Payer: 59 | Admitting: Women's Health

## 2021-02-15 ENCOUNTER — Encounter: Payer: Self-pay | Admitting: Women's Health

## 2021-02-15 ENCOUNTER — Other Ambulatory Visit: Payer: Self-pay

## 2021-02-15 ENCOUNTER — Other Ambulatory Visit (HOSPITAL_COMMUNITY)
Admission: RE | Admit: 2021-02-15 | Discharge: 2021-02-15 | Disposition: A | Discharge status: Home / Self Care | Payer: 59 | Source: Ambulatory Visit | Attending: Women's Health | Admitting: Women's Health

## 2021-02-15 VITALS — BP 116/82 | HR 93 | Ht 65.0 in | Wt 201.0 lb

## 2021-02-15 DIAGNOSIS — Z124 Encounter for screening for malignant neoplasm of cervix: Secondary | ICD-10-CM | POA: Insufficient documentation

## 2021-02-15 DIAGNOSIS — Z131 Encounter for screening for diabetes mellitus: Secondary | ICD-10-CM

## 2021-02-15 DIAGNOSIS — M797 Fibromyalgia: Secondary | ICD-10-CM | POA: Insufficient documentation

## 2021-02-15 DIAGNOSIS — Z01419 Encounter for gynecological examination (general) (routine) without abnormal findings: Secondary | ICD-10-CM

## 2021-02-15 DIAGNOSIS — Z1321 Encounter for screening for nutritional disorder: Secondary | ICD-10-CM

## 2021-02-15 DIAGNOSIS — Z3046 Encounter for surveillance of implantable subdermal contraceptive: Secondary | ICD-10-CM | POA: Diagnosis not present

## 2021-02-15 DIAGNOSIS — Z13220 Encounter for screening for lipoid disorders: Secondary | ICD-10-CM

## 2021-02-15 DIAGNOSIS — N6320 Unspecified lump in the left breast, unspecified quadrant: Secondary | ICD-10-CM

## 2021-02-15 DIAGNOSIS — Z1329 Encounter for screening for other suspected endocrine disorder: Secondary | ICD-10-CM

## 2021-02-15 NOTE — Patient Instructions (Signed)
Keep the area clean and dry.  You can remove the big bandage in 24 hours, and the small steri-strip bandage in 3-5 days.  Start prenatal vitamins

## 2021-02-15 NOTE — Progress Notes (Signed)
WELL-WOMAN EXAMINATION Patient name: Katherine Mora MRN [id number]  Date of birth: 1996/06/08 Chief Complaint:   Annual Exam and Nexplanon Removal  History of Present Illness:   Katherine Mora is a 25 y.o. G71P0010 Caucasian female being seen today for a routine well-woman exam and Nexplanon removal. Her Nexplanon was placed 2020 in [place].  She desires removal because she feels it may be causing palpitations to worsen and she would like to try to get pregnant. Signed copy of informed consent in chart. States periods have always been irregular, at times will go w/o period (prior to [facility]), never been dx w/ PCOS. Does have fibromyalgia.   PCP: [facility]      does desire labs No LMP recorded. Patient has had an implant. The current method of family planning is Nexplanon.  Last pap ~76yr ago. Results were: negative per pt report at MD in [place] . H/O abnormal pap: no Last mammogram: never. Results were: N/A. Family h/o breast cancer: no Last colonoscopy: never. Results were: N/A. Family h/o colorectal cancer: no  Depression screen PHQ 2/9 02/15/2021  Decreased Interest 1  Down, Depressed, Hopeless 1  PHQ - 2 Score 2  Altered sleeping 3  Tired, decreased energy 2  Change in appetite 2  Feeling bad or failure about yourself  0  Trouble concentrating 1  Moving slowly or fidgety/restless 0  Suicidal thoughts 0  PHQ-9 Score 10     GAD 7 : Generalized Anxiety Score 02/15/2021  Nervous, Anxious, on Edge 0  Control/stop worrying 0  Worry too much - different things 0  Trouble relaxing 1  Restless 0  Easily annoyed or irritable 1  Afraid - awful might happen 0  Total GAD 7 Score 2     Review of Systems:   Pertinent items are noted in HPI Denies any headaches, blurred vision, fatigue, shortness of breath, chest pain, abdominal pain, abnormal vaginal discharge/itching/odor/irritation, problems with periods, bowel movements, urination, or intercourse unless otherwise stated  above. Pertinent History Reviewed:  Reviewed past medical,surgical, social and family history.  Reviewed problem list, medications and allergies. Physical Assessment:   Vitals:   02/15/21 1052  BP: 116/82  Pulse: 93  Weight: 201 lb (91.2 kg)  Height: 5\' 5"  (1.651 m)  Body mass index is 33.45 kg/m.        Physical Examination:   General appearance - well appearing, and in no distress  Mental status - alert, oriented to person, place, and time  Psych:  She has a normal mood and affect  Skin - warm and dry, normal color, no suspicious lesions noted  Chest - effort normal, all lung fields clear to auscultation bilaterally  Heart - normal rate and regular rhythm  Neck:  midline trachea, no thyromegaly or nodules  Breasts - Rt breast appears normal, no suspicious masses, no skin or nipple changes or axillary nodes; Lt breast ~3cm elongated firm area of tissue @ 2 o'clock few fb from nipple  Abdomen - soft, nontender, nondistended, no masses or organomegaly  Pelvic - VULVA: normal appearing vulva with no masses, tenderness or lesions  VAGINA: normal appearing vagina with normal color and discharge, no lesions  CERVIX: normal appearing cervix without discharge or lesions, no CMT  Thin prep pap is done w/ HR HPV cotesting  UTERUS: uterus is felt to be normal size, shape, consistency and nontender   ADNEXA: No adnexal masses or tenderness noted.  Extremities:  No swelling or varicosities noted  Chaperone:  NEXPLANON REMOVAL Time out was performed.  Nexplanon site identified.  Area prepped in usual sterile fashon. One cc of 2% lidocaine was used to anesthetize the area at the distal end of the implant. A small stab incision was made right beside the implant on the distal portion.  The Nexplanon rod was grasped using hemostats and removed without difficulty.  There was less than 3 cc blood loss. There were no complications.  Steri-strips were applied over the small incision  and a pressure bandage was applied.  The patient tolerated the procedure well.  Assessment & Plan:  1) Well-Woman Exam   2) Nexplanon removal She was instructed to keep the area clean and dry, remove pressure bandage in 24 hours, and keep insertion site covered with the steri-strip for 3-5 days.   Follow-up PRN problems.  3) Desire for pregnancy> start pnv, let us know if gets +HPT  4) H/O irregular/infrequent periods> no dx PCOS, let us know if goes w/o period and -HPT  5) Lt breast mass> will get u/s, orders placed, note routed to [facility] to call and schedule and notify pt  Labs/procedures today: pap, nexplanon removal  Colonoscopy: @ 24yo, or sooner if problems  Orders Placed This Encounter  Procedures   US BREAST LTD UNI LEFT INC AXILLA   US BREAST LTD UNI RIGHT INC AXILLA   CBC   Comprehensive metabolic panel   TSH   Lipid panel   Hemoglobin A1c   VITAMIN D 25 Hydroxy (Vit-D Deficiency, Fractures)    Meds: No orders of the defined types were placed in this encounter.   Follow-up: Return in about 1 year (around 02/15/2022) for Physical.  Cheral Marker CNM, WHNP-BC 02/15/2021 1:09 PM

## 2021-02-16 ENCOUNTER — Ambulatory Visit (HOSPITAL_COMMUNITY)
Admission: RE | Admit: 2021-02-16 | Discharge: 2021-02-16 | Disposition: A | Discharge status: Home / Self Care | Payer: 59 | Source: Ambulatory Visit | Attending: Women's Health | Admitting: Women's Health

## 2021-02-16 DIAGNOSIS — N6320 Unspecified lump in the left breast, unspecified quadrant: Secondary | ICD-10-CM | POA: Diagnosis present

## 2021-02-16 LAB — COMPREHENSIVE METABOLIC PANEL
ALT: 13 IU/L (ref 0–32)
AST: 16 IU/L (ref 0–40)
Albumin/Globulin Ratio: 2.2 (ref 1.2–2.2)
Albumin: 5 g/dL (ref 3.9–5.0)
Alkaline Phosphatase: 72 IU/L (ref 44–121)
BUN/Creatinine Ratio: 16 (ref 9–23)
BUN: 12 mg/dL (ref 6–20)
Bilirubin Total: 0.4 mg/dL (ref 0.0–1.2)
CO2: 24 mmol/L (ref 20–29)
Calcium: 10.2 mg/dL (ref 8.7–10.2)
Chloride: 102 mmol/L (ref 96–106)
Creatinine, Ser: 0.77 mg/dL (ref 0.57–1.00)
Globulin, Total: 2.3 g/dL (ref 1.5–4.5)
Glucose: 76 mg/dL (ref 65–99)
Potassium: 4.6 mmol/L (ref 3.5–5.2)
Sodium: 139 mmol/L (ref 134–144)
Total Protein: 7.3 g/dL (ref 6.0–8.5)
eGFR: 111 mL/min/{1.73_m2} (ref >59)

## 2021-02-16 LAB — LIPID PANEL
Chol/HDL Ratio: 4.5 ratio — ABNORMAL HIGH (ref 0.0–4.4)
Cholesterol, Total: 197 mg/dL (ref 100–199)
HDL: 44 mg/dL (ref >39)
LDL Chol Calc (NIH): 128 mg/dL — ABNORMAL HIGH (ref 0–99)
Triglycerides: 138 mg/dL (ref 0–149)
VLDL Cholesterol Cal: 25 mg/dL (ref 5–40)

## 2021-02-16 LAB — HEMOGLOBIN A1C
Est. average glucose Bld gHb Est-mCnc: 97 mg/dL
Hgb A1c MFr Bld: 5 % (ref 4.8–5.6)

## 2021-02-16 LAB — CBC
Hematocrit: 49.4 % — ABNORMAL HIGH (ref 34.0–46.6)
Hemoglobin: 16.2 g/dL — ABNORMAL HIGH (ref 11.1–15.9)
MCH: 29.5 pg (ref 26.6–33.0)
MCHC: 32.8 g/dL (ref 31.5–35.7)
MCV: 90 fL (ref 79–97)
Platelets: 296 10*3/uL (ref 150–450)
RBC: 5.49 x10E6/uL — ABNORMAL HIGH (ref 3.77–5.28)
RDW: 12.8 % (ref 11.7–15.4)
WBC: 7.6 10*3/uL (ref 3.4–10.8)

## 2021-02-16 LAB — VITAMIN D 25 HYDROXY (VIT D DEFICIENCY, FRACTURES): Vit D, 25-Hydroxy: 33.9 ng/mL (ref 30.0–100.0)

## 2021-02-16 LAB — TSH: TSH: 1.16 u[IU]/mL (ref 0.450–4.500)

## 2021-02-17 LAB — CYTOLOGY - PAP
Chlamydia: NEGATIVE
Comment: NEGATIVE
Comment: NORMAL
Diagnosis: NEGATIVE
Neisseria Gonorrhea: NEGATIVE

## 2021-03-01 ENCOUNTER — Encounter: Payer: Self-pay | Admitting: Allergy & Immunology

## 2021-03-04 MED ORDER — ZAFIRLUKAST 10 MG PO TABS: 10.0000 mg | ORAL_TABLET | Freq: Two times a day (BID) | ORAL | 1 refills | Status: DC

## 2021-04-12 ENCOUNTER — Other Ambulatory Visit: Payer: Self-pay | Admitting: Allergy & Immunology

## 2021-04-12 NOTE — Telephone Encounter (Signed)
Please advise to refill as I did not see it listed on avs

## 2021-08-02 ENCOUNTER — Ambulatory Visit: Payer: Self-pay | Admitting: Women's Health

## 2021-08-04 ENCOUNTER — Ambulatory Visit (INDEPENDENT_AMBULATORY_CARE_PROVIDER_SITE_OTHER): Payer: 59 | Admitting: Women's Health

## 2021-08-04 ENCOUNTER — Encounter: Payer: Self-pay | Admitting: Women's Health

## 2021-08-04 ENCOUNTER — Other Ambulatory Visit: Payer: Self-pay

## 2021-08-04 VITALS — BP 131/90 | HR 117 | Ht 65.0 in | Wt 213.8 lb

## 2021-08-04 DIAGNOSIS — Z3202 Encounter for pregnancy test, result negative: Secondary | ICD-10-CM

## 2021-08-04 DIAGNOSIS — N926 Irregular menstruation, unspecified: Secondary | ICD-10-CM

## 2021-08-04 DIAGNOSIS — Z1321 Encounter for screening for nutritional disorder: Secondary | ICD-10-CM | POA: Diagnosis not present

## 2021-08-04 DIAGNOSIS — Z130 Encounter for screening for diseases of the blood and blood-forming organs and certain disorders involving the immune mechanism: Secondary | ICD-10-CM

## 2021-08-04 LAB — POCT URINE PREGNANCY: Preg Test, Ur: NEGATIVE

## 2021-08-04 NOTE — Progress Notes (Signed)
° °  GYN VISIT Patient name: Katherine Mora MRN [id number]  Date of birth: [birth year] Chief Complaint:   Fertility  History of Present Illness:   Katherine Mora is a 26 y.o. G83P0010 Caucasian female being seen today for report of faint +HPTs, breast soreness, n/v.  Nexplanon removed 02/15/21. Regular periods since. App tells her she is 7d late. Taking pnv. Wants checked for anemia, vit D deficiency and folate, states these were all low in past.  Patient's last menstrual period was 07/04/2021 (exact date). The current method of family planning is none.  Last pap 02/15/21. Results were: NILM w/ HRHPV not done  Depression screen [facility] 2/9 02/15/2021  Decreased Interest 1  Down, Depressed, Hopeless 1  PHQ - 2 Score 2  Altered sleeping 3  Tired, decreased energy 2  Change in appetite 2  Feeling bad or failure about yourself  0  Trouble concentrating 1  Moving slowly or fidgety/restless 0  Suicidal thoughts 0  PHQ-9 Score 10     GAD 7 : Generalized Anxiety Score 02/15/2021  Nervous, Anxious, on Edge 0  Control/stop worrying 0  Worry too much - different things 0  Trouble relaxing 1  Restless 0  Easily annoyed or irritable 1  Afraid - awful might happen 0  Total GAD 7 Score 2     Review of Systems:   Pertinent items are noted in HPI Denies fever/chills, dizziness, headaches, visual disturbances, fatigue, shortness of breath, chest pain, abdominal pain, vomiting, abnormal vaginal discharge/itching/odor/irritation, problems with periods, bowel movements, urination, or intercourse unless otherwise stated above.  Pertinent History Reviewed:  Reviewed past medical,surgical, social, obstetrical and family history.  Reviewed problem list, medications and allergies. Physical Assessment:   Vitals:   08/04/21 1340  BP: 131/90  Pulse: (!) 117  Weight: 213 lb 12.8 oz (97 kg)  Height: 5\' 5"  (1.651 m)  Body mass index is 35.58 kg/m.       Physical Examination:   General appearance:  alert, well appearing, and in no distress  Mental status: alert, oriented to person, place, and time  Skin: warm & dry   Cardiovascular: normal heart rate noted  Respiratory: normal respiratory effort, no distress  Abdomen: soft, non-tender   Pelvic: examination not indicated  Extremities: no edema   Chaperone: N/A    Results for orders placed or performed in visit on 08/04/21 (from the past 24 hour(s))  POCT urine pregnancy   Collection Time: 08/04/21  1:46 PM  Result Value Ref Range   Preg Test, Ur Negative Negative    Assessment & Plan:  1) Faint +HPTs> w/ breast soreness and n/v, neg UPT here, wants BHCG  2) H/O low folate/vitD/hgb> wants these levels checked. Understands [facility] doesn't cover, states she has another insurance through work- to show them at 10/02/21: No orders of the defined types were placed in this encounter.   Orders Placed This Encounter  Procedures   Beta hCG quant (ref lab)   CBC   VITAMIN D 25 Hydroxy (Vit-D Deficiency, Fractures)   Folate   POCT urine pregnancy    Return for will discuss f/u when labs back.  [employer] CNM, [facility] 08/04/2021 2:06 PM

## 2021-08-05 LAB — CBC
Hematocrit: 44.7 % (ref 34.0–46.6)
Hemoglobin: 15.4 g/dL (ref 11.1–15.9)
MCH: 30.3 pg (ref 26.6–33.0)
MCHC: 34.5 g/dL (ref 31.5–35.7)
MCV: 88 fL (ref 79–97)
Platelets: 263 10*3/uL (ref 150–450)
RBC: 5.08 x10E6/uL (ref 3.77–5.28)
RDW: 13 % (ref 11.7–15.4)
WBC: 7.4 10*3/uL (ref 3.4–10.8)

## 2021-08-05 LAB — BETA HCG QUANT (REF LAB): hCG Quant: <1 m[IU]/mL

## 2021-08-05 LAB — VITAMIN D 25 HYDROXY (VIT D DEFICIENCY, FRACTURES): Vit D, 25-Hydroxy: 26.2 ng/mL — ABNORMAL LOW (ref 30.0–100.0)

## 2021-08-05 LAB — FOLATE: Folate: >20 ng/mL (ref >3.0)

## 2021-10-12 ENCOUNTER — Telehealth: Payer: Self-pay | Admitting: Obstetrics & Gynecology

## 2021-10-12 DIAGNOSIS — O200 Threatened abortion: Secondary | ICD-10-CM

## 2021-10-12 NOTE — Telephone Encounter (Signed)
Patient was seen at [facility] over the weekend and they wouldn't do a u/s because her hcg was a 3. She was told to get her ob to retest her hcg levels on the 14th. I put her on the lab schedule, just need the orders. Possible miscarriage.  ?

## 2021-10-13 NOTE — Addendum Note (Signed)
Addended by: Dorita Sciara, Lenoard Helbert A on: 10/13/2021 09:56 AM ? ? Modules accepted: Orders ? ?

## 2021-10-15 ENCOUNTER — Other Ambulatory Visit: Payer: 59

## 2021-10-15 LAB — BETA HCG QUANT (REF LAB): hCG Quant: <1 m[IU]/mL

## 2022-02-02 ENCOUNTER — Ambulatory Visit: Payer: 59 | Admitting: Cardiology

## 2022-03-23 ENCOUNTER — Ambulatory Visit: Payer: 59 | Admitting: Cardiology

## 2022-03-28 ENCOUNTER — Emergency Department (HOSPITAL_COMMUNITY): Payer: Self-pay

## 2022-03-28 ENCOUNTER — Emergency Department (HOSPITAL_COMMUNITY)
Admission: EM | Admit: 2022-03-28 | Discharge: 2022-03-28 | Disposition: A | Discharge status: Home / Self Care | Payer: Self-pay | Attending: Emergency Medicine | Admitting: Emergency Medicine

## 2022-03-28 DIAGNOSIS — Z87891 Personal history of nicotine dependence: Secondary | ICD-10-CM | POA: Insufficient documentation

## 2022-03-28 DIAGNOSIS — N132 Hydronephrosis with renal and ureteral calculous obstruction: Secondary | ICD-10-CM | POA: Insufficient documentation

## 2022-03-28 DIAGNOSIS — N200 Calculus of kidney: Secondary | ICD-10-CM

## 2022-03-28 DIAGNOSIS — N9489 Other specified conditions associated with female genital organs and menstrual cycle: Secondary | ICD-10-CM | POA: Insufficient documentation

## 2022-03-28 DIAGNOSIS — J45909 Unspecified asthma, uncomplicated: Secondary | ICD-10-CM | POA: Insufficient documentation

## 2022-03-28 LAB — COMPREHENSIVE METABOLIC PANEL
ALT: 17 U/L (ref 0–44)
AST: 17 U/L (ref 15–41)
Albumin: 4.9 g/dL (ref 3.5–5.0)
Alkaline Phosphatase: 56 U/L (ref 38–126)
Anion gap: 9 (ref 5–15)
BUN: 12 mg/dL (ref 6–20)
CO2: 24 mmol/L (ref 22–32)
Calcium: 9.7 mg/dL (ref 8.9–10.3)
Chloride: 108 mmol/L (ref 98–111)
Creatinine, Ser: 0.76 mg/dL (ref 0.44–1.00)
GFR, Estimated: >60 mL/min (ref >60)
Glucose, Bld: 107 mg/dL — ABNORMAL HIGH (ref 70–99)
Potassium: 3.4 mmol/L — ABNORMAL LOW (ref 3.5–5.1)
Sodium: 141 mmol/L (ref 135–145)
Total Bilirubin: 0.7 mg/dL (ref 0.3–1.2)
Total Protein: 7.9 g/dL (ref 6.5–8.1)

## 2022-03-28 LAB — URINALYSIS, ROUTINE W REFLEX MICROSCOPIC
Bilirubin Urine: NEGATIVE
Glucose, UA: NEGATIVE mg/dL
Ketones, ur: NEGATIVE mg/dL
Leukocytes,Ua: NEGATIVE
Nitrite: NEGATIVE
Protein, ur: 30 mg/dL — AB
Specific Gravity, Urine: 1.021 (ref 1.005–1.030)
pH: 6 (ref 5.0–8.0)

## 2022-03-28 LAB — CBC
HCT: 48.1 % — ABNORMAL HIGH (ref 36.0–46.0)
Hemoglobin: 16.2 g/dL — ABNORMAL HIGH (ref 12.0–15.0)
MCH: 30.2 pg (ref 26.0–34.0)
MCHC: 33.7 g/dL (ref 30.0–36.0)
MCV: 89.7 fL (ref 80.0–100.0)
Platelets: 262 10*3/uL (ref 150–400)
RBC: 5.36 MIL/uL — ABNORMAL HIGH (ref 3.87–5.11)
RDW: 13.5 % (ref 11.5–15.5)
WBC: 11.8 10*3/uL — ABNORMAL HIGH (ref 4.0–10.5)
nRBC: 0 % (ref 0.0–0.2)

## 2022-03-28 LAB — HCG, QUANTITATIVE, PREGNANCY: hCG, Beta Chain, Quant, S: <1 m[IU]/mL (ref <5)

## 2022-03-28 MED ORDER — KETOROLAC TROMETHAMINE 15 MG/ML IJ SOLN
15.0000 mg | Freq: Once | INTRAMUSCULAR | Status: AC
  Administered 2022-03-28: 15 mg via INTRAVENOUS
  Filled 2022-03-28: qty 1

## 2022-03-28 MED ORDER — HYDROMORPHONE HCL 1 MG/ML IJ SOLN
1.0000 mg | Freq: Once | INTRAMUSCULAR | Status: AC
  Administered 2022-03-28: 1 mg via INTRAVENOUS
  Filled 2022-03-28: qty 1

## 2022-03-28 MED ORDER — NICOTINE 21 MG/24HR TD PT24
21.0000 mg | MEDICATED_PATCH | Freq: Once | TRANSDERMAL | Status: DC
  Administered 2022-03-28: 21 mg via TRANSDERMAL
  Filled 2022-03-28: qty 1

## 2022-03-28 MED ORDER — ONDANSETRON HCL 4 MG/2ML IJ SOLN
4.0000 mg | Freq: Once | INTRAMUSCULAR | Status: AC
  Administered 2022-03-28: 4 mg via INTRAVENOUS
  Filled 2022-03-28: qty 2

## 2022-03-28 MED ORDER — TAMSULOSIN HCL 0.4 MG PO CAPS: 0.4000 mg | ORAL_CAPSULE | Freq: Every day | ORAL | 0 refills | Status: AC

## 2022-03-28 MED ORDER — NAPROXEN 500 MG PO TABS: 500.0000 mg | ORAL_TABLET | Freq: Two times a day (BID) | ORAL | 0 refills | Status: DC

## 2022-03-28 MED ORDER — SODIUM CHLORIDE 0.9 % IV BOLUS
1000.0000 mL | Freq: Once | INTRAVENOUS | Status: AC
  Administered 2022-03-28: 1000 mL via INTRAVENOUS

## 2022-03-28 MED ORDER — OXYCODONE HCL 5 MG PO TABS: 5.0000 mg | ORAL_TABLET | ORAL | 0 refills | Status: DC | PRN

## 2022-03-28 MED ORDER — GLYCERIN (ADULT) 2 G RE SUPP: 1.0000 | RECTAL | 0 refills | Status: DC | PRN

## 2022-03-28 NOTE — ED Triage Notes (Signed)
Pt arrived from home via POV w c/o right flank pain began at 1900 went away then came back and woke her up.

## 2022-03-28 NOTE — ED Provider Notes (Signed)
  Physical Exam  BP 118/78   Pulse 74   Temp 98 F (36.7 C) (Oral)   Resp 17   SpO2 100%   Physical Exam Vitals and nursing note reviewed.  Constitutional:      General: She is not in acute distress.    Appearance: She is well-developed.  HENT:     Head: Normocephalic and atraumatic.  Eyes:     Conjunctiva/sclera: Conjunctivae normal.  Cardiovascular:     Rate and Rhythm: Normal rate and regular rhythm.     Heart sounds: No murmur heard. Pulmonary:     Effort: Pulmonary effort is normal. No respiratory distress.     Breath sounds: Normal breath sounds.  Abdominal:     Palpations: Abdomen is soft.     Tenderness: There is no abdominal tenderness. There is right CVA tenderness.  Musculoskeletal:        General: No swelling.     Cervical back: Neck supple.  Skin:    General: Skin is warm and dry.     Capillary Refill: Capillary refill takes less than 2 seconds.  Neurological:     Mental Status: She is alert.  Psychiatric:        Mood and Affect: Mood normal.     Procedures  Procedures  ED Course / MDM    Medical Decision Making Amount and/or Complexity of Data Reviewed Labs: ordered. Radiology: ordered.  Risk OTC drugs. Prescription drug management.   Patient received in handoff.  CT concerning for nephrolithiasis and patient pending pain control and urinalysis.  Urinalysis without infection and pain controlled with Toradol.  Patient also concerned about constipation and she was given a prescription for glycerin suppositories.  Patient discharged on Flomax, Naprosyn, oxycodone and urology follow-up.       Teressa Lower, MD 03/28/22 1134

## 2022-03-28 NOTE — ED Provider Notes (Signed)
[facility]-EMERGENCY DEPT [facility] Emergency Department Provider Note MRN:  [id number]  Arrival date & time: 03/28/22     Chief Complaint   Flank Pain   History of Present Illness   Katherine Mora is a 26 y.o. year-old female with no prior past medical history presenting to the ED with chief complaint of flank pain.  Right flank and right lower quadrant abdominal pain for the past few hours, pain is severe.  Associated with nausea, hematuria.  Review of Systems  A thorough review of systems was obtained and all systems are negative except as noted in the HPI and PMH.   Patient's Health History    Past Medical History:  Diagnosis Date   Asthma    Eczema    Fibromyalgia 2018   Heart palpitations    Mental disorder    Bipolar    Past Surgical History:  Procedure Laterality Date   NASAL SEPTUM SURGERY Bilateral 2015   SINOSCOPY      Family History  Problem Relation Age of Onset   Sarcoidosis Maternal Grandmother    Cancer Maternal Grandfather        skin   Heart attack Father    Eczema Brother    Allergic rhinitis Brother    Urticaria Brother     Social History   Socioeconomic History   Marital status: Single    Spouse name: Not on file   Number of children: Not on file   Years of education: Not on file   Highest education level: Not on file  Occupational History   Not on file  Tobacco Use   Smoking status: Former    Packs/day: 0.25    Types: Cigarettes    Quit date: 05/2021    Years since quitting: 0.8   Smokeless tobacco: Never  Vaping Use   Vaping Use: Former  Substance and Sexual Activity   Alcohol use: Yes    Comment: Occasional    Drug use: Not Currently   Sexual activity: Yes    Birth control/protection: None  Other Topics Concern   Not on file  Social History Narrative   Not on file   Social Determinants of Health   Financial Resource Strain: Medium Risk (02/15/2021)   Overall Financial Resource Strain (CARDIA)    Difficulty of  Paying Living Expenses: Somewhat hard  Food Insecurity: Food Insecurity Present (02/15/2021)   Hunger Vital Sign    Worried About Running Out of Food in the Last Year: Sometimes true    Ran Out of Food in the Last Year: Sometimes true  Transportation Needs: No Transportation Needs (02/15/2021)   PRAPARE - Administrator, Civil Service (Medical): No    Lack of Transportation (Non-Medical): No  Physical Activity: Sufficiently Active (02/15/2021)   Exercise Vital Sign    Days of Exercise per Week: 4 days    Minutes of Exercise per Session: 60 min  Stress: Stress Concern Present (02/15/2021)   [employer] of Occupational Health - Occupational Stress Questionnaire    Feeling of Stress : To some extent  Social Connections: Moderately Isolated (02/15/2021)   Social Connection and Isolation Panel [NHANES]    Frequency of Communication with Friends and Family: Three times a week    Frequency of Social Gatherings with Friends and Family: Twice a week    Attends Religious Services: More than 4 times per year    Active Member of [employer] or Organizations: No    Attends Banker Meetings: Never  Marital Status: Never married  Intimate Partner Violence: Not At Risk (02/15/2021)   Humiliation, Afraid, Rape, and Kick questionnaire    Fear of Current or Ex-Partner: No    Emotionally Abused: No    Physically Abused: No    Sexually Abused: No     Physical Exam   Vitals:   03/28/22 0600 03/28/22 0700  BP: 127/89 (!) 123/95  Pulse: 78 81  Resp: 16 17  Temp:  98 F (36.7 C)  SpO2: 100% 95%    CONSTITUTIONAL: Well-appearing, NAD NEURO/PSYCH:  Alert and oriented x 3, no focal deficits EYES:  eyes equal and reactive ENT/NECK:  no LAD, no JVD CARDIO: Regular rate, well-perfused, normal S1 and S2 PULM:  CTAB no wheezing or rhonchi GI/GU:  non-distended, non-tender MSK/SPINE:  No gross deformities, no edema SKIN:  no rash, atraumatic   *Additional and/or pertinent  findings included in MDM below  Diagnostic and Interventional Summary    EKG Interpretation  Date/Time:    Ventricular Rate:    PR Interval:    QRS Duration:   QT Interval:    QTC Calculation:   R Axis:     Text Interpretation:         Labs Reviewed  CBC - Abnormal; Notable for the following components:      Result Value   WBC 11.8 (*)    RBC 5.36 (*)    Hemoglobin 16.2 (*)    HCT 48.1 (*)    All other components within normal limits  COMPREHENSIVE METABOLIC PANEL - Abnormal; Notable for the following components:   Potassium 3.4 (*)    Glucose, Bld 107 (*)    All other components within normal limits  HCG, QUANTITATIVE, PREGNANCY  URINALYSIS, ROUTINE W REFLEX MICROSCOPIC  PREGNANCY, URINE    CT RENAL STONE STUDY  Final Result      Medications  HYDROmorphone (DILAUDID) injection 1 mg (1 mg Intravenous Given 03/28/22 0413)  sodium chloride 0.9 % bolus 1,000 mL (0 mLs Intravenous Stopped 03/28/22 0602)  ondansetron (ZOFRAN) injection 4 mg (4 mg Intravenous Given 03/28/22 0413)  ketorolac (TORADOL) 15 MG/ML injection 15 mg (15 mg Intravenous Given 03/28/22 0711)  HYDROmorphone (DILAUDID) injection 1 mg (1 mg Intravenous Given 03/28/22 0715)     Procedures  /  Critical Care Procedures  ED Course and Medical Decision Making  Initial Impression and Ddx Kidney stone is favored given the flank, right lower quadrant pain with hematuria.  Appendicitis, ovarian cyst or torsion is considered but felt to be less likely.  Awaiting labs, CT.  Past medical/surgical history that increases complexity of ED encounter: Kidney stones  Interpretation of Diagnostics I personally reviewed the laboratory assessment and my interpretation is as follows: No significant blood count or electrolyte disturbance  CT confirms 4 mm stone, still awaiting urinalysis.  Patient Reassessment and Ultimate Disposition/Management     Patient still having fair amount of pain, signed out to oncoming  provider.  Patient management required discussion with the following services or consulting groups:  None  Complexity of Problems Addressed Acute illness or injury that poses threat of life of bodily function  Additional Data Reviewed and Analyzed Further history obtained from: Further history from spouse/family member  Additional Factors Impacting ED Encounter Risk Use of parenteral controlled substances  Elmer Sow. Pilar Plate, MD [facility] Emergency Medicine [facility] [email or web address]  Final Clinical Impressions(s) / ED Diagnoses     ICD-10-CM   1. Kidney stone  N20.0  ED Discharge Orders     None        Discharge Instructions Discussed with and Provided to Patient:   Discharge Instructions   None      Maudie Flakes, MD 03/28/22 [phone]

## 2022-04-27 ENCOUNTER — Ambulatory Visit: Payer: 59 | Admitting: Cardiology

## 2022-06-15 ENCOUNTER — Other Ambulatory Visit: Payer: Self-pay | Admitting: Family Medicine

## 2022-06-15 ENCOUNTER — Other Ambulatory Visit: Payer: Self-pay | Admitting: Allergy & Immunology

## 2022-06-29 DIAGNOSIS — M797 Fibromyalgia: Secondary | ICD-10-CM | POA: Diagnosis not present

## 2022-06-29 DIAGNOSIS — E559 Vitamin D deficiency, unspecified: Secondary | ICD-10-CM | POA: Diagnosis not present

## 2022-06-29 DIAGNOSIS — J302 Other seasonal allergic rhinitis: Secondary | ICD-10-CM | POA: Diagnosis not present

## 2022-06-29 DIAGNOSIS — J4520 Mild intermittent asthma, uncomplicated: Secondary | ICD-10-CM | POA: Diagnosis not present

## 2022-06-29 DIAGNOSIS — Z87442 Personal history of urinary calculi: Secondary | ICD-10-CM | POA: Diagnosis not present

## 2022-06-29 DIAGNOSIS — Z832 Family history of diseases of the blood and blood-forming organs and certain disorders involving the immune mechanism: Secondary | ICD-10-CM | POA: Diagnosis not present

## 2022-06-29 DIAGNOSIS — Z6829 Body mass index (BMI) 29.0-29.9, adult: Secondary | ICD-10-CM | POA: Diagnosis not present

## 2022-06-29 DIAGNOSIS — G43909 Migraine, unspecified, not intractable, without status migrainosus: Secondary | ICD-10-CM | POA: Diagnosis not present

## 2022-06-29 DIAGNOSIS — R030 Elevated blood-pressure reading, without diagnosis of hypertension: Secondary | ICD-10-CM | POA: Diagnosis not present

## 2022-07-07 DIAGNOSIS — J4520 Mild intermittent asthma, uncomplicated: Secondary | ICD-10-CM | POA: Diagnosis not present

## 2022-07-29 ENCOUNTER — Institutional Professional Consult (permissible substitution) (HOSPITAL_BASED_OUTPATIENT_CLINIC_OR_DEPARTMENT_OTHER): Payer: Medicaid Other | Admitting: Pulmonary Disease

## 2022-07-29 NOTE — Progress Notes (Deleted)
Subjective:   PATIENT ID: Katherine Mora GENDER: female DOB: October 11, 1995, MRN: [id number]  No chief complaint on file.   Reason for Visit: New consult for sarcoid  Ms. Rain Fliegel is a a 27 year old with seasonal allergies, asthma, migraine and fibromyalgia who presents for evaluation for sarcoid.  PCP referred for evaluation for sarcoid. Family history of sarcoid with grandmother. Has mild astham with albuterol as needed and daily zafirlukast  Asthma Control Test ACT Total Score  09/09/2020  9:00 AM 23    2022 Jan Feb Mar April May June July Aug Sept Oct Nov Dec                2023 Jan Feb Mar April May June July Aug Sept Oct Nov Dec                2024 Jan Feb Mar April May June July Aug Sept Oct Nov Dec                  *** Social History:  Environmental exposures: ***  I have personally reviewed patient's past medical/family/social history, allergies, current medications.***  Past Medical History:  Diagnosis Date   Asthma    Eczema    Fibromyalgia 2018   Heart palpitations    Mental disorder    Bipolar     Family History  Problem Relation Age of Onset   Sarcoidosis Maternal Grandmother    Cancer Maternal Grandfather        skin   Heart attack Father    Eczema Brother    Allergic rhinitis Brother    Urticaria Brother      Social History   Occupational History   Not on file  Tobacco Use   Smoking status: Former    Packs/day: 0.25    Types: Cigarettes    Quit date: 05/2021    Years since quitting: 1.2   Smokeless tobacco: Never  Vaping Use   Vaping Use: Former  Substance and Sexual Activity   Alcohol use: Yes    Comment: Occasional    Drug use: Not Currently   Sexual activity: Yes    Birth control/protection: None    Allergies  Allergen Reactions   Mometasone Furo-Formoterol Fum     Other reaction(s): Asthma and/or Shortness of Breath, Nausea and/or Vomiting   Montelukast Hives and Shortness Of Breath   Penicillins Anaphylaxis     Family History    Prednisone     Anxiety; rash    Tricyclic Antidepressants Other (See Comments)    Panic Attacks, Asthma Flare    Venlafaxine Hcl Other (See Comments)    Blurred vision, feeling faint   Clindamycin Rash     Outpatient Medications Prior to Visit  Medication Sig Dispense Refill   ALBUTEROL SULFATE HFA IN Inhale 2 puffs into the lungs every 4 (four) hours as needed.     Budesonide (PULMICORT FLEXHALER) 90 MCG/ACT inhaler Inhale 2 puffs into the lungs 2 (two) times daily. 1 each 0   cyclobenzaprine (FLEXERIL) 10 MG tablet Take 10 mg by mouth at bedtime.     glycerin adult 2 g suppository Place 1 suppository rectally as needed for constipation. 12 suppository 0   lidocaine (XYLOCAINE) 2 % solution Use as directed 15 mLs in the mouth or throat as needed for mouth pain. 50 mL 0   metoprolol succinate (TOPROL XL) 25 MG 24 hr tablet Take 1 tablet (25 mg total) by mouth daily. 30 tablet 3  Multiple Vitamin (MULTIVITAMIN) tablet Take 1 tablet by mouth daily.     naproxen (NAPROSYN) 500 MG tablet Take 1 tablet (500 mg total) by mouth 2 (two) times daily. 30 tablet 0   ofloxacin (OCUFLOX) 0.3 % ophthalmic solution 5 drops 2 (two) times daily.     ondansetron (ZOFRAN ODT) 4 MG disintegrating tablet Take 1 tablet (4 mg total) by mouth every 8 (eight) hours as needed for nausea or vomiting. 20 tablet 0   oxyCODONE (ROXICODONE) 5 MG immediate release tablet Take 1 tablet (5 mg total) by mouth every 4 (four) hours as needed for breakthrough pain. 10 tablet 0   Prenatal Vit-Fe Fumarate-FA (MULTIVITAMIN-PRENATAL) 27-0.8 MG TABS tablet Take 1 tablet by mouth daily at 12 noon.     traMADol (ULTRAM) 50 MG tablet Take 50 mg by mouth every 6 (six) hours as needed.     triamcinolone (KENALOG) 0.025 % ointment Apply 1 application topically 2 (two) times daily. 454 g 0   zafirlukast (ACCOLATE) 10 MG tablet TAKE 1 TABLET BY MOUTH TWICE A DAY. 60 tablet 0   No facility-administered medications  prior to visit.    ROS   Objective:  There were no vitals filed for this visit.    Physical Exam: General: Well-appearing, no acute distress HENT: [place], AT Eyes: EOMI, no scleral icterus Respiratory: ***Clear to auscultation bilaterally.  No crackles, wheezing or rales Cardiovascular: RRR, -M/R/G, no JVD Extremities:-Edema,-tenderness Neuro: AAO x4, CNII-XII grossly intact Psych: Normal mood, normal affect  Data Reviewed:  Imaging:  PFT: None on file  Labs: CBC    Component Value Date/Time   WBC 11.8 (H) 03/28/2022 0349   RBC 5.36 (H) 03/28/2022 0349   HGB 16.2 (H) 03/28/2022 0349   HGB 15.4 08/04/2021 1417   HCT 48.1 (H) 03/28/2022 0349   HCT 44.7 08/04/2021 1417   PLT 262 03/28/2022 0349   PLT 263 08/04/2021 1417   MCV 89.7 03/28/2022 0349   MCV 88 08/04/2021 1417   MCH 30.2 03/28/2022 0349   MCHC 33.7 03/28/2022 0349   RDW 13.5 03/28/2022 0349   RDW 13.0 08/04/2021 1417   06/29/22 WBC 6.1 Hg 15.2 Plt 241     Latest Ref Rng & Units 03/28/2022    3:49 AM 02/15/2021   12:02 PM 10/05/2020   12:38 AM  CMP  Glucose 70 - 99 mg/dL 107  76  100   BUN 6 - 20 mg/dL '12  12  12   '$ Creatinine 0.44 - 1.00 mg/dL 0.76  0.77  0.70   Sodium 135 - 145 mmol/L 141  139  139   Potassium 3.5 - 5.1 mmol/L 3.4  4.6  3.4   Chloride 98 - 111 mmol/L 108  102  105   CO2 22 - 32 mmol/L '24  24  24   '$ Calcium 8.9 - 10.3 mg/dL 9.7  10.2  9.3   Total Protein 6.5 - 8.1 g/dL 7.9  7.3  7.4   Total Bilirubin 0.3 - 1.2 mg/dL 0.7  0.4  0.8   Alkaline Phos 38 - 126 U/L 56  72  44   AST 15 - 41 U/L '17  16  19   '$ ALT 0 - 44 U/L '17  13  22    '$ BMET BUN/Cr 10 Na 137 K 4.6 Cl 102 CO2 21 Ca 9.6 AP 53 AST 53 ALT 11  Vitamin D 24.9 (L)  Abs eos 06/29/22 - 100     Assessment & Plan:  Discussion: HPI  Asthma --ORDER pulmonary function tests  Fatigue Family hx sarcoid --CXR  Health Maintenance  There is no immunization history on file for this patient. CT Lung Screen***  No orders  of the defined types were placed in this encounter. No orders of the defined types were placed in this encounter.   No follow-ups on file.  I have spent a total time of***-minutes on the day of the appointment reviewing prior documentation, coordinating care and discussing medical diagnosis and plan with the patient/family. Imaging, labs and tests included in this note have been reviewed and interpreted independently by me.  Gray, MD [place] [facility] 07/29/2022 8:05 AM  Office Number [phone]

## 2022-08-01 ENCOUNTER — Telehealth: Payer: Self-pay

## 2022-08-01 NOTE — Telephone Encounter (Signed)
Mychart msg sent. AS, CMA 

## 2022-08-12 ENCOUNTER — Institutional Professional Consult (permissible substitution) (HOSPITAL_BASED_OUTPATIENT_CLINIC_OR_DEPARTMENT_OTHER): Payer: Medicaid Other | Admitting: Pulmonary Disease

## 2022-10-03 ENCOUNTER — Ambulatory Visit
Admission: EM | Admit: 2022-10-03 | Discharge: 2022-10-03 | Disposition: A | Discharge status: Home / Self Care | Payer: Medicaid Other | Attending: Family Medicine | Admitting: Family Medicine

## 2022-10-03 ENCOUNTER — Ambulatory Visit (INDEPENDENT_AMBULATORY_CARE_PROVIDER_SITE_OTHER): Payer: Medicaid Other

## 2022-10-03 DIAGNOSIS — W109XXA Fall (on) (from) unspecified stairs and steps, initial encounter: Secondary | ICD-10-CM | POA: Diagnosis not present

## 2022-10-03 DIAGNOSIS — M25522 Pain in left elbow: Secondary | ICD-10-CM | POA: Diagnosis not present

## 2022-10-03 DIAGNOSIS — M546 Pain in thoracic spine: Secondary | ICD-10-CM

## 2022-10-03 DIAGNOSIS — M545 Low back pain, unspecified: Secondary | ICD-10-CM | POA: Diagnosis not present

## 2022-10-03 DIAGNOSIS — W19XXXA Unspecified fall, initial encounter: Secondary | ICD-10-CM

## 2022-10-03 DIAGNOSIS — Z3202 Encounter for pregnancy test, result negative: Secondary | ICD-10-CM

## 2022-10-03 DIAGNOSIS — Y929 Unspecified place or not applicable: Secondary | ICD-10-CM | POA: Diagnosis not present

## 2022-10-03 DIAGNOSIS — M797 Fibromyalgia: Secondary | ICD-10-CM | POA: Insufficient documentation

## 2022-10-03 DIAGNOSIS — Y939 Activity, unspecified: Secondary | ICD-10-CM | POA: Diagnosis not present

## 2022-10-03 DIAGNOSIS — M4126 Other idiopathic scoliosis, lumbar region: Secondary | ICD-10-CM | POA: Diagnosis not present

## 2022-10-03 DIAGNOSIS — R55 Syncope and collapse: Secondary | ICD-10-CM | POA: Diagnosis not present

## 2022-10-03 DIAGNOSIS — M25521 Pain in right elbow: Secondary | ICD-10-CM

## 2022-10-03 DIAGNOSIS — R8290 Unspecified abnormal findings in urine: Secondary | ICD-10-CM | POA: Diagnosis not present

## 2022-10-03 DIAGNOSIS — N939 Abnormal uterine and vaginal bleeding, unspecified: Secondary | ICD-10-CM | POA: Diagnosis not present

## 2022-10-03 LAB — POCT URINALYSIS DIP (MANUAL ENTRY)
Bilirubin, UA: NEGATIVE
Glucose, UA: NEGATIVE mg/dL
Ketones, POC UA: NEGATIVE mg/dL
Nitrite, UA: NEGATIVE
Protein Ur, POC: NEGATIVE mg/dL
Spec Grav, UA: 1.02 (ref 1.010–1.025)
Urobilinogen, UA: 0.2 E.U./dL
pH, UA: 5.5 (ref 5.0–8.0)

## 2022-10-03 LAB — POCT URINE PREGNANCY: Preg Test, Ur: NEGATIVE

## 2022-10-03 MED ORDER — OXYCODONE-ACETAMINOPHEN 5-325 MG PO TABS: 1.0000 | ORAL_TABLET | Freq: Every evening | ORAL | 0 refills | Status: DC | PRN

## 2022-10-03 MED ORDER — IBUPROFEN 600 MG PO TABS: 600.0000 mg | ORAL_TABLET | Freq: Four times a day (QID) | ORAL | 0 refills | Status: DC | PRN

## 2022-10-03 NOTE — ED Triage Notes (Signed)
Pt reports pain in the low back, middle back and elbows after she she tripped and fell last night on the stairs. Pt reports she passed out x 15-20 seconds.   Pt requested pregnancy test. Pt reports menstrual period is 11 days late and she started spotting after the fall yesterday.

## 2022-10-04 LAB — URINE CULTURE: Culture: <10000 — AB

## 2022-10-04 NOTE — ED Provider Notes (Signed)
[facility]    CSN: [id number] Arrival date & time: 10/03/22  0949      History   Chief Complaint Chief Complaint  Patient presents with   Fall   Back Pain   Elbow Pain    HPI Katherine Mora is a 27 y.o. female.   Patient presenting today with mid and low back pain, bilateral elbow pain after tripping and falling on some stairs last night.  She denies head injury, dizziness, nausea, vomiting, altered mental status associated.  She states she did "pass out" after the fall but this is common for her with pain and she is not concerned about this or wanting further workup on this at this time.  She is mainly here for x-rays of the areas of her pain.  She has a history of fibromyalgia and is awaiting rheumatology workup for potential other musculoskeletal issues.  She is also requesting a pregnancy test.  She states her menstrual period is 11 days late and she had a positive pregnancy test last week.  Started spotting after the fall yesterday and is concerned that she is having a miscarriage.  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  So far trying over-the-counter pain relievers with minimal relief.    Past Medical History:  Diagnosis Date   Asthma    Eczema    Fibromyalgia 2018   Heart palpitations    Mental disorder    Bipolar    Patient Active Problem List   Diagnosis Date Noted   Fibromyalgia 02/15/2021   Food intolerance 09/09/2020   Seasonal allergic rhinitis due to pollen 09/09/2020   Mild intermittent asthma, uncomplicated [id number]    Past Surgical History:  Procedure Laterality Date   NASAL SEPTUM SURGERY Bilateral 2015   SINOSCOPY      OB History     Gravida  1   Para      Term      Preterm      AB  1   Living         SAB  1   IAB      Ectopic      Multiple      Live Births               Home Medications    Prior to Admission medications   Medication Sig Start Date End Date Taking? Authorizing Provider   ibuprofen (ADVIL) 600 MG tablet Take 1 tablet (600 mg total) by mouth every 6 (six) hours as needed. 10/03/22  Yes Volney American, PA-C  oxyCODONE-acetaminophen (PERCOCET/ROXICET) 5-325 MG tablet Take 1 tablet by mouth at bedtime as needed for severe pain. 10/03/22  Yes Volney American, PA-C  ALBUTEROL SULFATE HFA IN Inhale 2 puffs into the lungs every 4 (four) hours as needed.    [provider]  Budesonide (PULMICORT FLEXHALER) 90 MCG/ACT inhaler Inhale 2 puffs into the lungs 2 (two) times daily. 01/17/21   Scot Jun, NP  cyclobenzaprine (FLEXERIL) 10 MG tablet Take 10 mg by mouth at bedtime. 07/15/21   [provider]  glycerin adult 2 g suppository Place 1 suppository rectally as needed for constipation. 03/28/22   Kommor, [place], MD  lidocaine (XYLOCAINE) 2 % solution Use as directed 15 mLs in the mouth or throat as needed for mouth pain. 02/08/21   Scot Jun, NP  metoprolol succinate (TOPROL XL) 25 MG 24 hr tablet Take 1 tablet (25 mg total) by mouth daily. 12/31/20  Deberah Pelton, NP  Multiple Vitamin (MULTIVITAMIN) tablet Take 1 tablet by mouth daily.    [provider]  naproxen (NAPROSYN) 500 MG tablet Take 1 tablet (500 mg total) by mouth 2 (two) times daily. 03/28/22   Kommor, Madison, MD  ofloxacin (OCUFLOX) 0.3 % ophthalmic solution 5 drops 2 (two) times daily. 02/12/21   [provider]  ondansetron (ZOFRAN ODT) 4 MG disintegrating tablet Take 1 tablet (4 mg total) by mouth every 8 (eight) hours as needed for nausea or vomiting. 10/05/20   Rancour, Annie Main, MD  oxyCODONE (ROXICODONE) 5 MG immediate release tablet Take 1 tablet (5 mg total) by mouth every 4 (four) hours as needed for breakthrough pain. 03/28/22   Kommor, Madison, MD  Prenatal Vit-Fe Fumarate-FA (MULTIVITAMIN-PRENATAL) 27-0.8 MG TABS tablet Take 1 tablet by mouth daily at 12 noon.    [provider]  traMADol (ULTRAM) 50 MG tablet Take 50 mg by mouth  every 6 (six) hours as needed.    [provider]  triamcinolone (KENALOG) 0.025 % ointment Apply 1 application topically 2 (two) times daily. 02/08/21   Scot Jun, NP  zafirlukast (ACCOLATE) 10 MG tablet TAKE 1 TABLET BY MOUTH TWICE A DAY. 04/19/21   Valentina Shaggy, MD    Family History Family History  Problem Relation Age of Onset   Sarcoidosis Maternal Grandmother    Cancer Maternal Grandfather        skin   Heart attack Father    Eczema Brother    Allergic rhinitis Brother    Urticaria Brother     Social History Social History   Tobacco Use   Smoking status: Former    Packs/day: .25    Types: Cigarettes    Quit date: 05/2021    Years since quitting: 1.4   Smokeless tobacco: Never  Vaping Use   Vaping Use: Former  Substance Use Topics   Alcohol use: Yes    Comment: Occasional    Drug use: Not Currently     Allergies   Mometasone furo-formoterol fum, Montelukast, Penicillins, Prednisone, Tricyclic antidepressants, Venlafaxine hcl, and Clindamycin   Review of Systems Review of Systems PER HPI  Physical Exam Triage Vital Signs ED Triage Vitals  Enc Vitals Group     BP 10/03/22 1048 136/81     Pulse Rate 10/03/22 1048 77     Resp 10/03/22 1048 16     Temp 10/03/22 1048 98.4 F (36.9 C)     Temp Source 10/03/22 1048 Oral     SpO2 10/03/22 1048 98 %     Weight --      Height --      Head Circumference --      Peak Flow --      Pain Score 10/03/22 1051 9     Pain Loc --      Pain Edu? --      Excl. in [place]? --    No data found.  Updated Vital Signs BP 136/81 (BP Location: Right Arm)   Pulse 77   Temp 98.4 F (36.9 C) (Oral)   Resp 16   LMP 08/30/2022 (Exact Date)   SpO2 98%   Visual Acuity Right Eye Distance:   Left Eye Distance:   Bilateral Distance:    Right Eye Near:   Left Eye Near:    Bilateral Near:     Physical Exam Vitals and nursing note reviewed.  Constitutional:      Appearance: Normal appearance. She  is not ill-appearing.  HENT:     Head: Atraumatic.     Mouth/Throat:     Mouth: Mucous membranes are moist.  Eyes:     Extraocular Movements: Extraocular movements intact.     Conjunctiva/sclera: Conjunctivae normal.  Cardiovascular:     Rate and Rhythm: Normal rate and regular rhythm.     Heart sounds: Normal heart sounds.  Pulmonary:     Effort: Pulmonary effort is normal.     Breath sounds: Normal breath sounds.  Abdominal:     General: Bowel sounds are normal. There is no distension.     Palpations: Abdomen is soft.     Tenderness: There is no abdominal tenderness. There is no guarding.  Genitourinary:    Comments: GU exam deferred Musculoskeletal:        General: Tenderness and signs of injury present. No swelling or deformity. Normal range of motion.     Cervical back: Normal range of motion and neck supple.     Comments: Midline thoracic spinal tenderness to palpation without bony deformity palpable.  Bilateral lumbosacral paraspinal muscles tender to palpation.  Negative straight leg raise bilateral lower extremities.  Normal gait and range of motion, strength full and equal all 4 extremities.  Bilateral elbows tender to palpation without bony deformity, normal range of motion  Skin:    General: Skin is warm and dry.     Findings: No bruising or erythema.  Neurological:     Mental Status: She is alert and oriented to person, place, and time.     Motor: No weakness.     Gait: Gait normal.     Comments: All 4 extremities neurovascularly intact  Psychiatric:        Mood and Affect: Mood normal.        Thought Content: Thought content normal.        Judgment: Judgment normal.      UC Treatments / Results  Labs (all labs ordered are listed, but only abnormal results are displayed) Labs Reviewed  POCT URINALYSIS DIP (MANUAL ENTRY) - Abnormal; Notable for the following components:      Result Value   Clarity, UA cloudy (*)    Blood, UA moderate (*)    Leukocytes, UA  Large (3+) (*)    All other components within normal limits  URINE CULTURE  POCT URINE PREGNANCY    EKG   Radiology DG Elbow Complete Left  Result Date: 10/03/2022 CLINICAL DATA:  Low back pain, mid back pain and elbow pain, tripped and fell on the stairs last night, loss of consciousness EXAM: LEFT ELBOW - COMPLETE 3+ VIEW COMPARISON:  None Available. FINDINGS: Osseous mineralization normal. Joint spaces preserved. No fracture, dislocation, or bone destruction. No joint effusion. IMPRESSION: No osseous abnormalities. Electronically Signed   By: Lavonia Dana M.D.   On: 10/03/2022 12:22   DG Thoracic Spine 2 View  Result Date: 10/03/2022 CLINICAL DATA:  Low back pain, mid back pain and elbow pain, tripped and fell on the stairs last night, loss of consciousness EXAM: THORACIC SPINE 2 VIEWS COMPARISON:  None Available. FINDINGS: 12 pairs of ribs. Vertebral body and disc space heights maintained. No fracture, subluxation, or bone destruction. IMPRESSION: No osseous abnormalities. Electronically Signed   By: Lavonia Dana M.D.   On: 10/03/2022 12:21   DG Lumbar Spine Complete  Result Date: 10/03/2022 CLINICAL DATA:  Tripped and fell last night on stairs, lumbosacral, low back, mid back, and elbow pain, loss of consciousness EXAM:  LUMBAR SPINE - COMPLETE 4+ VIEW COMPARISON:  None Available. FINDINGS: Six non-rib-bearing lumbar type vertebra, with incompletely lumbarized S1. Vertebral body and disc space heights maintained. No fracture, subluxation, or bone destruction. Osseous mineralization normal. Minimal levoconvex lumbar scoliosis. IMPRESSION: No acute osseous abnormalities. Electronically Signed   By: Lavonia Dana M.D.   On: 10/03/2022 12:03    Procedures Procedures (including critical care time)  Medications Ordered in UC Medications - No data to display  Initial Impression / Assessment and Plan / UC Course  I have reviewed the triage vital signs and the nursing notes.  Pertinent labs &  imaging results that were available during my care of the patient were reviewed by me and considered in my medical decision making (see chart for details).     Patient requesting x-rays of the left elbow, mid and low back to assess for bony injuries from the fall.  These were all negative for acute bony abnormality.  Reassurance given, discussed anti-inflammatories, RICE protocol and she is requesting a very small supply of oxycodone to help with sleep.  3 pills given with PDMP checked and appropriate.  Regarding her vaginal bleeding and recent positive pregnancy test, discussed that we are unable to further evaluate this here and recommended the women's emergency department, she declines as she states she is familiar with miscarriage and feels that this is what is going on with her.  Her pregnancy test was negative today.  Follow-up with OB/GYN as soon as possible.  Final Clinical Impressions(s) / UC Diagnoses   Final diagnoses:  Abnormal urinalysis  Acute midline thoracic back pain  Acute bilateral low back pain without sciatica  Bilateral elbow joint pain  Fall, initial encounter  Negative pregnancy test  Vaginal bleeding   Discharge Instructions   None    ED Prescriptions     Medication Sig Dispense Auth. Provider   ibuprofen (ADVIL) 600 MG tablet Take 1 tablet (600 mg total) by mouth every 6 (six) hours as needed. 30 tablet Volney American, [place]   oxyCODONE-acetaminophen (PERCOCET/ROXICET) 5-325 MG tablet Take 1 tablet by mouth at bedtime as needed for severe pain. 3 tablet Volney American, [place]      I have reviewed the PDMP during this encounter.   Volney American, [place] 10/04/22 1645

## 2022-10-25 IMAGING — CT CT ABD-PELV W/ CM
2 of 4 series · 16 of 46 positions shown, 18 images · IV contrast (Omnipaque or Isovue)
Comparison: Ultrasound 04/16/2020

CLINICAL DATA: Right lower quadrant pain

EXAM:
CT ABDOMEN AND PELVIS WITH CONTRAST
TECHNIQUE: Multidetector CT imaging of the abdomen and pelvis was performed
using the standard protocol following bolus administration of
intravenous contrast.
CONTRAST:  100mL OMNIPAQUE IOHEXOL 300 MG/ML  SOLN

[Series 2: axial st · axial · 0.73mm/px · z∈[+818,+1283]mm · 13 of 103 slices shown, 15 images]
[im 5/103  soft-tissue]
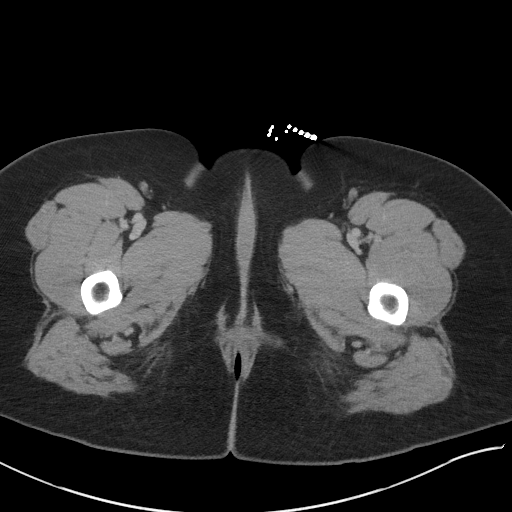
[im 5/103  bone]
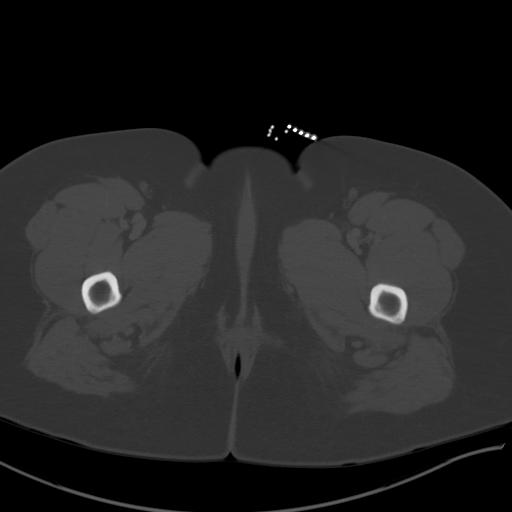
[im 14/103  soft-tissue]
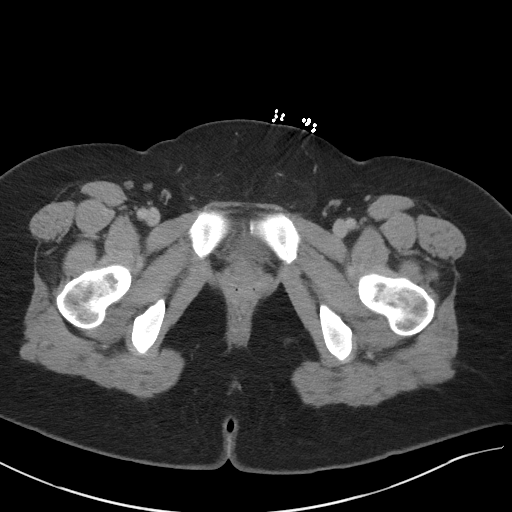
[im 23/103  soft-tissue]
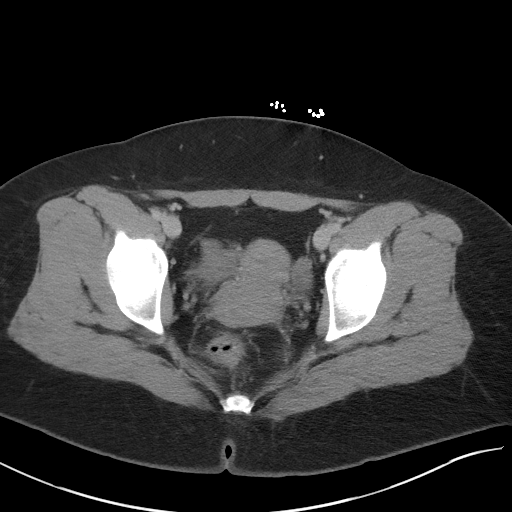
[im 27/103  soft-tissue]
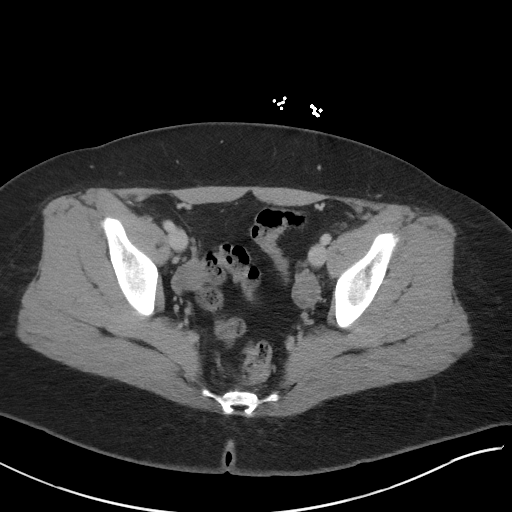
[im 36/103  soft-tissue]
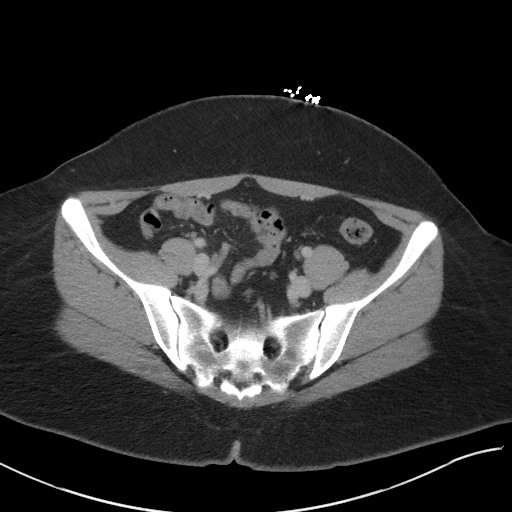
[im 45/103  soft-tissue]
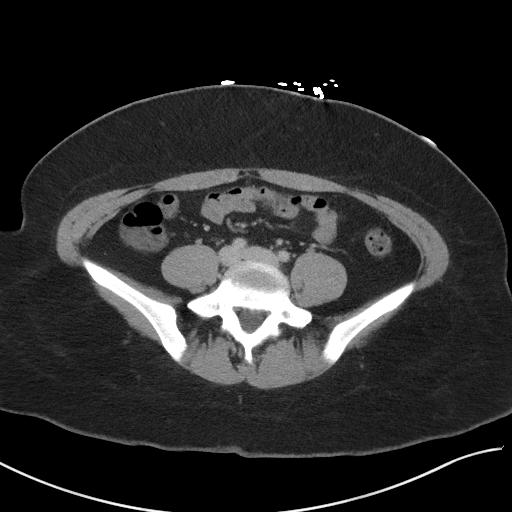
[im 54/103  soft-tissue]
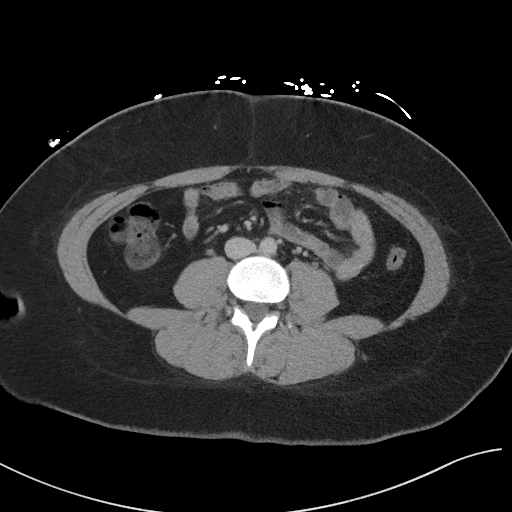
[im 58/103  soft-tissue]
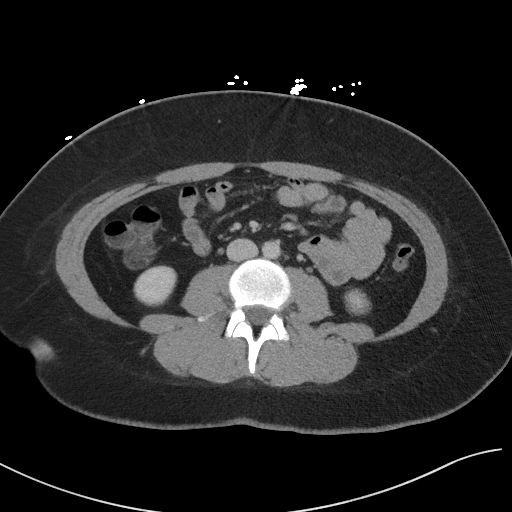
[im 67/103  soft-tissue]
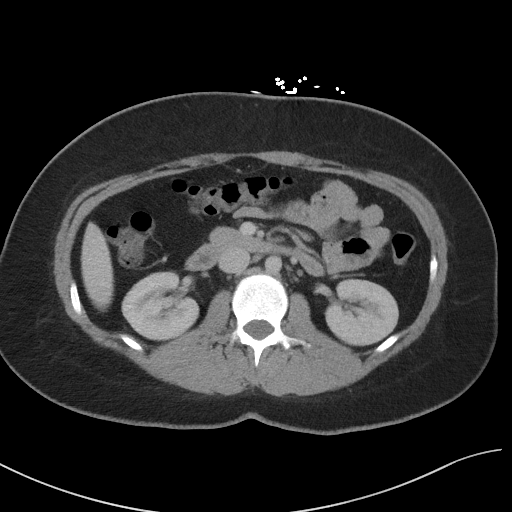
[im 67/103  bone]
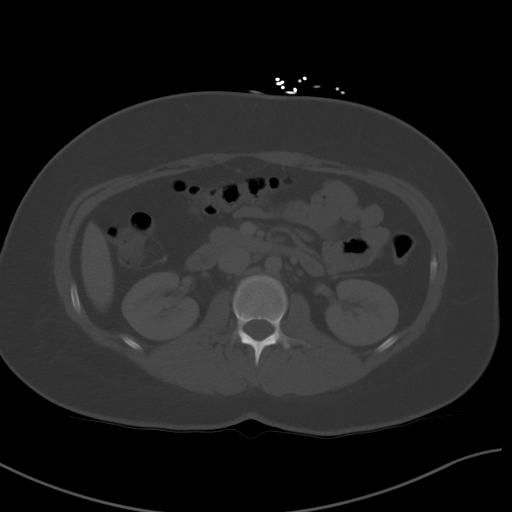
[im 76/103  soft-tissue]
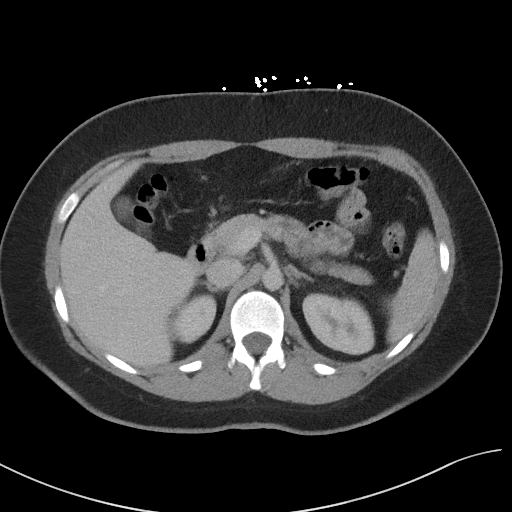
[im 80/103  soft-tissue]
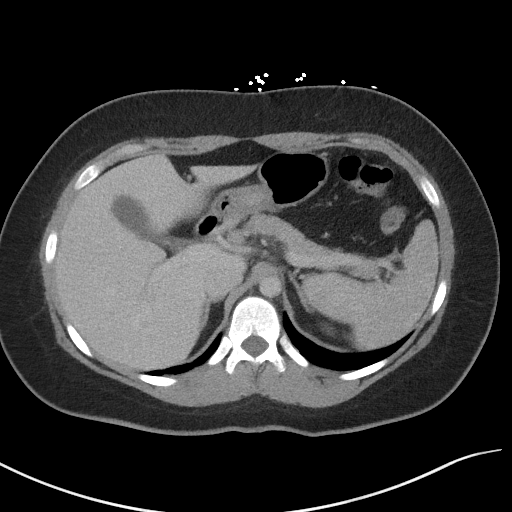
[im 89/103  soft-tissue]
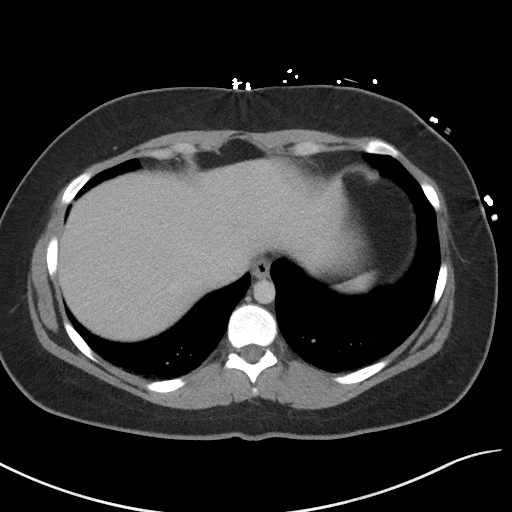
[im 98/103  soft-tissue]
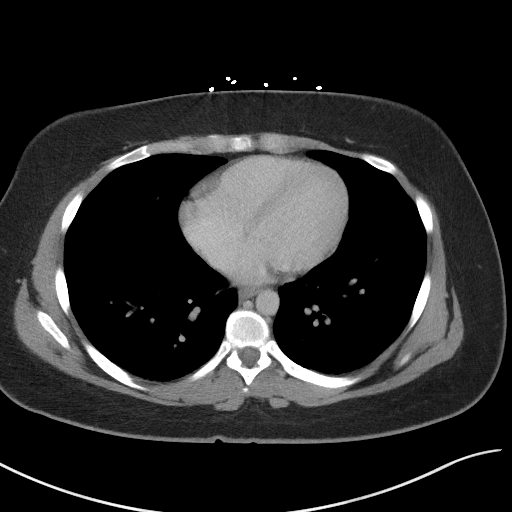

[Series 5: coronal st · coronal · 0.83mm/px · 3 of 114 slices shown]
[im 38/114  soft-tissue]
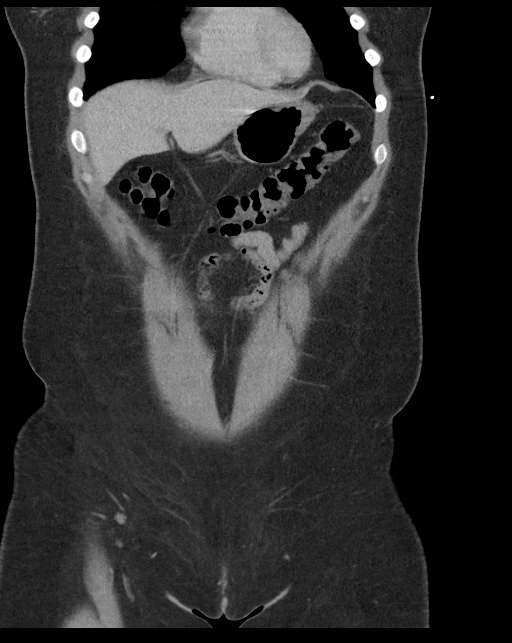
[im 51/114  soft-tissue]
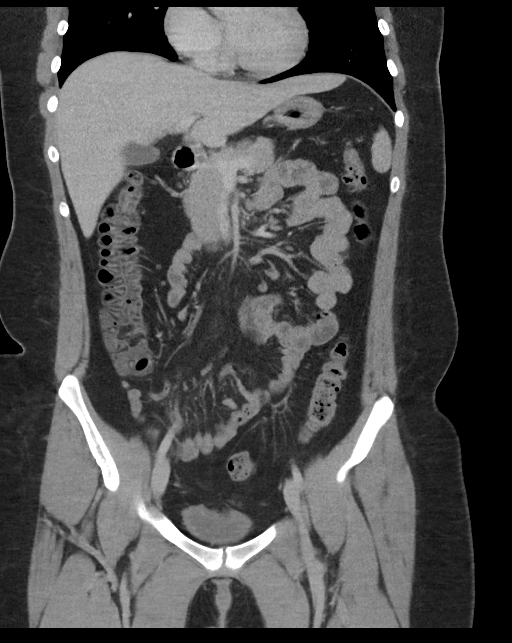
[im 63/114  soft-tissue]
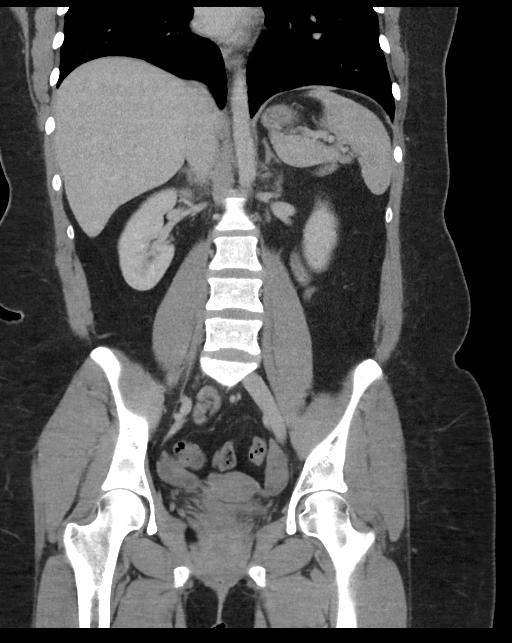

[16 of 46 positions shown; findings below may reference images not displayed]

FINDINGS: Lower chest: Dependent atelectasis.

Hepatobiliary: No focal hepatic abnormality. Gallbladder
unremarkable.

Pancreas: No focal abnormality or ductal dilatation.

Spleen: No focal abnormality.  Normal size.

Adrenals/Urinary Tract: Punctate nonobstructing stone in the midpole
of the right kidney. No ureteral stones or hydronephrosis. Adrenal
glands and urinary bladder unremarkable.

Stomach/Bowel: Normal appendix. Stomach, large and small bowel
grossly unremarkable.

Vascular/Lymphatic: No evidence of aneurysm or adenopathy.

Reproductive: Uterus and adnexa unremarkable.  No mass.

Other: No free fluid or free air.

Musculoskeletal: No acute bony abnormality.
IMPRESSION: Normal appendix.

Punctate right renal midpole stone. No ureteral stones or
hydronephrosis.

No acute findings in the abdomen or pelvis.

## 2022-11-11 ENCOUNTER — Ambulatory Visit: Payer: Medicaid Other | Admitting: Internal Medicine

## 2022-11-11 ENCOUNTER — Encounter: Payer: Self-pay | Admitting: Internal Medicine

## 2022-11-11 VITALS — BP 118/73 | HR 98 | Ht 64.0 in | Wt 177.8 lb

## 2022-11-11 DIAGNOSIS — K909 Intestinal malabsorption, unspecified: Secondary | ICD-10-CM

## 2022-11-11 DIAGNOSIS — Z1321 Encounter for screening for nutritional disorder: Secondary | ICD-10-CM

## 2022-11-11 DIAGNOSIS — L819 Disorder of pigmentation, unspecified: Secondary | ICD-10-CM | POA: Insufficient documentation

## 2022-11-11 DIAGNOSIS — F1511 Other stimulant abuse, in remission: Secondary | ICD-10-CM | POA: Insufficient documentation

## 2022-11-11 DIAGNOSIS — N6321 Unspecified lump in the left breast, upper outer quadrant: Secondary | ICD-10-CM

## 2022-11-11 DIAGNOSIS — Z131 Encounter for screening for diabetes mellitus: Secondary | ICD-10-CM

## 2022-11-11 DIAGNOSIS — J301 Allergic rhinitis due to pollen: Secondary | ICD-10-CM | POA: Diagnosis not present

## 2022-11-11 DIAGNOSIS — Z114 Encounter for screening for human immunodeficiency virus [HIV]: Secondary | ICD-10-CM | POA: Diagnosis not present

## 2022-11-11 DIAGNOSIS — Z8711 Personal history of peptic ulcer disease: Secondary | ICD-10-CM | POA: Diagnosis not present

## 2022-11-11 DIAGNOSIS — F319 Bipolar disorder, unspecified: Secondary | ICD-10-CM | POA: Diagnosis not present

## 2022-11-11 DIAGNOSIS — Z1329 Encounter for screening for other suspected endocrine disorder: Secondary | ICD-10-CM | POA: Diagnosis not present

## 2022-11-11 DIAGNOSIS — M797 Fibromyalgia: Secondary | ICD-10-CM

## 2022-11-11 DIAGNOSIS — Z0001 Encounter for general adult medical examination with abnormal findings: Secondary | ICD-10-CM

## 2022-11-11 DIAGNOSIS — Z8669 Personal history of other diseases of the nervous system and sense organs: Secondary | ICD-10-CM

## 2022-11-11 DIAGNOSIS — Z13220 Encounter for screening for lipoid disorders: Secondary | ICD-10-CM | POA: Diagnosis not present

## 2022-11-11 DIAGNOSIS — N6320 Unspecified lump in the left breast, unspecified quadrant: Secondary | ICD-10-CM | POA: Insufficient documentation

## 2022-11-11 DIAGNOSIS — Z8739 Personal history of other diseases of the musculoskeletal system and connective tissue: Secondary | ICD-10-CM

## 2022-11-11 DIAGNOSIS — Z1159 Encounter for screening for other viral diseases: Secondary | ICD-10-CM

## 2022-11-11 DIAGNOSIS — N96 Recurrent pregnancy loss: Secondary | ICD-10-CM | POA: Diagnosis not present

## 2022-11-11 DIAGNOSIS — Z832 Family history of diseases of the blood and blood-forming organs and certain disorders involving the immune mechanism: Secondary | ICD-10-CM | POA: Diagnosis not present

## 2022-11-11 DIAGNOSIS — F909 Attention-deficit hyperactivity disorder, unspecified type: Secondary | ICD-10-CM

## 2022-11-11 DIAGNOSIS — F1911 Other psychoactive substance abuse, in remission: Secondary | ICD-10-CM

## 2022-11-11 MED ORDER — GABAPENTIN 100 MG PO CAPS
100.0000 mg | ORAL_CAPSULE | Freq: Every morning | ORAL | 0 refills | Status: DC
Start: 2022-11-11 — End: 2023-03-03

## 2022-11-11 MED ORDER — GABAPENTIN 300 MG PO CAPS: ORAL_CAPSULE | ORAL | 0 refills | Status: DC

## 2022-11-11 NOTE — Assessment & Plan Note (Signed)
History of migraine headaches.  Not currently on any preventative or abortive medications.  She requests a neurology referral today for evaluation as her headaches have increased both in frequency and intensity recently. -Neurology referral placed today

## 2022-11-11 NOTE — Assessment & Plan Note (Signed)
History of BPD 1.  Previously followed by psychiatry but is not currently on any medication specifically for treatment of bipolar disorder.  She states that gabapentin presents manic episodes.  She is interested in establishing care with psychiatry today, specifically counseling, to discuss positive coping mechanisms. -Integrated behavioral health referral placed today  Patient and/or legal guardian verbally consented to [facility] services about presenting concerns and psychiatric consultation as appropriate.  The services will be billed as appropriate for the patient.

## 2022-11-11 NOTE — Patient Instructions (Signed)
It was a pleasure to see you today.  Thank you for giving [place] the opportunity to be involved in your care.  Below is a brief recap of your visit and next steps.  We will plan to see you again in 4 weeks.  Summary You have established care today Labs ordered Referrals placed to gastroenterology, neurology, integrated behavioral health, ob/gyn, dermatology We will follow up in 4 weeks to review labs, discuss next steps in treatment

## 2022-11-11 NOTE — Assessment & Plan Note (Signed)
Family medical history significant for sarcoidosis in her maternal grandmother.  She is concerned that many of the symptoms she currently has are attributable to sarcoidosis.  She has mentioned this to her previous providers, but was told that she "was not symptomatic enough" to be referred to rheumatology. -Will start with basic labs today and further discuss at follow-up in 1 month.

## 2022-11-11 NOTE — Assessment & Plan Note (Addendum)
Previously documented history of fibromyalgia.  This is currently treated with gabapentin 100 mg every morning, 300 mg in the afternoon, and 600 mg nightly.  She also takes baclofen nightly to help her sleep. -No medication changes today -Gabapentin has been refilled

## 2022-11-11 NOTE — Assessment & Plan Note (Signed)
Symptoms are currently managed with zafirlukast.  No medication changes today.

## 2022-11-11 NOTE — Assessment & Plan Note (Signed)
Dermatology referral requested today as she endorses a 1 year history of a hyperpigmented rash that has become more diffuse.  She was initially told this rash was related to pregnancy, but the rash has further spread over the last 6 months. -Dermatology referral placed today

## 2022-11-11 NOTE — Assessment & Plan Note (Signed)
She endorses a history of methamphetamine abuse.  Recently achieved 60 days of sobriety after quitting methamphetamine use on 3/4.  Counseling referral requested today. -Integrated behavioral health referral placed

## 2022-11-11 NOTE — Assessment & Plan Note (Signed)
Without clear etiology.  New gynecology referral placed today

## 2022-11-11 NOTE — Assessment & Plan Note (Signed)
She has recently noted multiple lumps in her left breast.  Several of these lumps have previously been evaluated and deemed fatty tissue.  She has requested a new gynecology referral today as she does not wish to continue following at [facility]. -New gynecology referral placed today

## 2022-11-11 NOTE — Progress Notes (Signed)
New Patient Office Visit  Subjective    Patient ID: Katherine Mora, female    DOB: 14-Mar-1996  Age: 27 y.o. MRN: [id number]  CC:  Chief Complaint  Patient presents with   Establish Care    Patient is needing referral to rheumatologist. She has discoloration of the skin on shoulder, up the neck , down the arm and across the belly. She would like to be referred to a obgyn in [place]. She Is needing medication adjusted.     HPI Katherine Mora presents to establish care.  She is a 27 year old woman who endorses an extensive, complicated past medical history significant for BPD 1, fibromyalgia, migraines, recurrent nephrolithiasis, recurrent miscarriages, previous methamphetamine abuse, and malabsorption.  She has most recently been followed at [facility]. Katherine Mora has several concerns to discuss today and requests multiple referrals.  She endorses a history of chronic migraines, which have worsened in frequency and intensity recently, and requests a referral to neurology.  She endorses a history of malabsorption as well as peptic ulcer disease.  She would like to establish care with gastroenterology for reevaluation.  She would like to establish care with a new gynecologist and requests a new referral today as well.  Katherine Mora has noticed a hyperpigmented rash that has become more diffuse over the last year.  She requests a dermatology referral today for evaluation.  Lastly, she requests a referral for counseling due to a history of BPD 1, ADHD, and methamphetamine abuse.  She is not interested in starting medication, but would like to talk with someone to develop positive coping mechanisms.  She endorses former tobacco use, quitting in 2022.  Denies current alcohol or illicit drug use.  Her family medical history is significant for sarcoidosis, CAD, and diabetes mellitus.  Acute concerns, chronic medical conditions, and outstanding preventative care items discussed today  are individually addressed in A/P below.   Outpatient Encounter Medications as of 11/11/2022  Medication Sig   Baclofen 5 MG TABS Take by mouth.   gabapentin (NEURONTIN) 100 MG capsule Take 1 capsule (100 mg total) by mouth in the morning.   traMADol (ULTRAM) 50 MG tablet Take 50 mg by mouth every 6 (six) hours as needed.   zafirlukast (ACCOLATE) 10 MG tablet TAKE 1 TABLET BY MOUTH TWICE A DAY.   [DISCONTINUED] ALBUTEROL SULFATE HFA IN Inhale 2 puffs into the lungs every 4 (four) hours as needed.   [DISCONTINUED] gabapentin (NEURONTIN) 300 MG capsule Take 300 mg by mouth 2 (two) times daily.   [DISCONTINUED] HYDROcodone-acetaminophen (NORCO/VICODIN) 5-325 MG tablet Take 1 tablet by mouth 3 (three) times daily as needed.   [DISCONTINUED] ibuprofen (ADVIL) 600 MG tablet Take 1 tablet (600 mg total) by mouth every 6 (six) hours as needed.   [DISCONTINUED] metoprolol succinate (TOPROL XL) 25 MG 24 hr tablet Take 1 tablet (25 mg total) by mouth daily.   [DISCONTINUED] Multiple Vitamin (MULTIVITAMIN) tablet Take 1 tablet by mouth daily.   [DISCONTINUED] ondansetron (ZOFRAN ODT) 4 MG disintegrating tablet Take 1 tablet (4 mg total) by mouth every 8 (eight) hours as needed for nausea or vomiting.   [DISCONTINUED] tamsulosin (FLOMAX) 0.4 MG CAPS capsule SMARTSIG:1 Capsule(s) By Mouth Every Evening   [DISCONTINUED] triamcinolone (KENALOG) 0.025 % ointment Apply 1 application topically 2 (two) times daily.   gabapentin (NEURONTIN) 300 MG capsule Take 300 mg each afternoon and 600 mg nightly   [DISCONTINUED] Budesonide (PULMICORT FLEXHALER) 90 MCG/ACT inhaler Inhale 2 puffs into the lungs 2 (two)  times daily. (Patient not taking: Reported on 11/11/2022)   [DISCONTINUED] cyclobenzaprine (FLEXERIL) 10 MG tablet Take 10 mg by mouth at bedtime. (Patient not taking: Reported on 11/11/2022)   [DISCONTINUED] glycerin adult 2 g suppository Place 1 suppository rectally as needed for constipation. (Patient not taking:  Reported on 11/11/2022)   [DISCONTINUED] lidocaine (XYLOCAINE) 2 % solution Use as directed 15 mLs in the mouth or throat as needed for mouth pain. (Patient not taking: Reported on 11/11/2022)   [DISCONTINUED] naproxen (NAPROSYN) 500 MG tablet Take 1 tablet (500 mg total) by mouth 2 (two) times daily. (Patient not taking: Reported on 11/11/2022)   [DISCONTINUED] ofloxacin (OCUFLOX) 0.3 % ophthalmic solution 5 drops 2 (two) times daily. (Patient not taking: Reported on 11/11/2022)   [DISCONTINUED] oxyCODONE (ROXICODONE) 5 MG immediate release tablet Take 1 tablet (5 mg total) by mouth every 4 (four) hours as needed for breakthrough pain. (Patient not taking: Reported on 11/11/2022)   [DISCONTINUED] oxyCODONE-acetaminophen (PERCOCET/ROXICET) 5-325 MG tablet Take 1 tablet by mouth at bedtime as needed for severe pain. (Patient not taking: Reported on 11/11/2022)   [DISCONTINUED] Prenatal Vit-Fe Fumarate-FA (MULTIVITAMIN-PRENATAL) 27-0.8 MG TABS tablet Take 1 tablet by mouth daily at 12 noon. (Patient not taking: Reported on 11/11/2022)   No facility-administered encounter medications on file as of 11/11/2022.    Past Medical History:  Diagnosis Date   Asthma    Eczema    Fibromyalgia 2018   Heart palpitations    Mental disorder    Bipolar    Past Surgical History:  Procedure Laterality Date   NASAL SEPTUM SURGERY Bilateral 2015   SINOSCOPY      Family History  Problem Relation Age of Onset   Sarcoidosis Maternal Grandmother    Cancer Maternal Grandfather        skin   Heart attack Father    Eczema Brother    Allergic rhinitis Brother    Urticaria Brother     Social History   Socioeconomic History   Marital status: Single    Spouse name: Not on file   Number of children: Not on file   Years of education: Not on file   Highest education level: Not on file  Occupational History   Not on file  Tobacco Use   Smoking status: Former    Packs/day: .25    Types: Cigarettes    Quit  date: 05/2021    Years since quitting: 1.5   Smokeless tobacco: Never  Vaping Use   Vaping Use: Former  Substance and Sexual Activity   Alcohol use: Yes    Comment: Occasional    Drug use: Not Currently   Sexual activity: Yes    Birth control/protection: None  Other Topics Concern   Not on file  Social History Narrative   Not on file   Social Determinants of Health   Financial Resource Strain: Medium Risk (02/15/2021)   Overall Financial Resource Strain (CARDIA)    Difficulty of Paying Living Expenses: Somewhat hard  Food Insecurity: Food Insecurity Present (02/15/2021)   Hunger Vital Sign    Worried About Running Out of Food in the Last Year: Sometimes true    Ran Out of Food in the Last Year: Sometimes true  Transportation Needs: No Transportation Needs (02/15/2021)   PRAPARE - Administrator, Civil Service (Medical): No    Lack of Transportation (Non-Medical): No  Physical Activity: Sufficiently Active (02/15/2021)   Exercise Vital Sign    Days of Exercise per Week:  4 days    Minutes of Exercise per Session: 60 min  Stress: Stress Concern Present (02/15/2021)   [employer] of Occupational Health - Occupational Stress Questionnaire    Feeling of Stress : To some extent  Social Connections: Moderately Isolated (02/15/2021)   Social Connection and Isolation Panel [NHANES]    Frequency of Communication with Friends and Family: Three times a week    Frequency of Social Gatherings with Friends and Family: Twice a week    Attends Religious Services: More than 4 times per year    Active Member of [employer] or Organizations: No    Attends Banker Meetings: Never    Marital Status: Never married  Intimate Partner Violence: Not At Risk (02/15/2021)   Humiliation, Afraid, Rape, and Kick questionnaire    Fear of Current or Ex-Partner: No    Emotionally Abused: No    Physically Abused: No    Sexually Abused: No   Review of Systems  Constitutional:  Positive  for malaise/fatigue.  Musculoskeletal:  Positive for joint pain (Left lateral elbow pain).  Neurological:  Positive for headaches.  All other systems reviewed and are negative.  Objective    BP 118/73   Pulse 98   Ht 5\' 4"  (1.626 m)   Wt 177 lb 12.8 oz (80.6 kg)   SpO2 96%   BMI 30.52 kg/m   Physical Exam Constitutional:      General: She is not in acute distress.    Appearance: Normal appearance. She is obese. She is not toxic-appearing.  HENT:     Head: Normocephalic and atraumatic.     Right Ear: External ear normal.     Left Ear: External ear normal.     Nose: Nose normal. No congestion or rhinorrhea.     Mouth/Throat:     Mouth: Mucous membranes are moist.     Pharynx: Oropharynx is clear. No oropharyngeal exudate or posterior oropharyngeal erythema.  Eyes:     General: No scleral icterus.    Extraocular Movements: Extraocular movements intact.     Conjunctiva/sclera: Conjunctivae normal.     Pupils: Pupils are equal, round, and reactive to light.  Cardiovascular:     Rate and Rhythm: Normal rate and regular rhythm.     Pulses: Normal pulses.     Heart sounds: Normal heart sounds. No murmur heard.    No friction rub. No gallop.  Pulmonary:     Effort: Pulmonary effort is normal.     Breath sounds: Normal breath sounds. No wheezing, rhonchi or rales.  Abdominal:     General: Abdomen is flat. Bowel sounds are normal. There is no distension.     Palpations: Abdomen is soft.     Tenderness: There is no abdominal tenderness.  Musculoskeletal:        General: Tenderness (TTP over the left lateral epicondyle) present. No swelling. Normal range of motion.     Cervical back: Normal range of motion.     Right lower leg: No edema.     Left lower leg: No edema.  Lymphadenopathy:     Cervical: No cervical adenopathy.  Skin:    General: Skin is warm and dry.     Capillary Refill: Capillary refill takes less than 2 seconds.     Coloration: Skin is not jaundiced.      Findings: Rash (Diffuse, hyperpigmented rash noted on neck, shoulders bilaterally, and thoracic back.) present.  Neurological:     General: No focal deficit present.  Mental Status: She is alert and oriented to person, place, and time.  Psychiatric:        Mood and Affect: Mood normal.        Behavior: Behavior normal.    Assessment & Plan:   Problem List Items Addressed This Visit       Seasonal allergic rhinitis due to pollen    Symptoms are currently managed with zafirlukast.  No medication changes today.      Malabsorption    She endorses a history of malabsorption, stating that at one point her weight was up to 270 pounds and today she weighs 177 pounds.  He describes diarrhea that seems to be triggered after eating red meat in particular.  This has never been worked up before, per patient. -Baseline labs ordered today, will add alpha gal/celiac screening labs. -Gastroenterology referral placed at patient's request      Hyperpigmented skin lesion    Dermatology referral requested today as she endorses a 1 year history of a hyperpigmented rash that has become more diffuse.  She was initially told this rash was related to pregnancy, but the rash has further spread over the last 6 months. -Dermatology referral placed today      Fibromyalgia    Previously documented history of fibromyalgia.  This is currently treated with gabapentin 100 mg every morning, 300 mg in the afternoon, and 600 mg nightly.  She also takes baclofen nightly to help her sleep. -No medication changes today -Gabapentin has been refilled      Hx of migraines - Primary    History of migraine headaches.  Not currently on any preventative or abortive medications.  She requests a neurology referral today for evaluation as her headaches have increased both in frequency and intensity recently. -Neurology referral placed today      Bipolar 1 disorder (HCC)    History of BPD 1.  Previously followed by  psychiatry but is not currently on any medication specifically for treatment of bipolar disorder.  She states that gabapentin presents manic episodes.  She is interested in establishing care with psychiatry today, specifically counseling, to discuss positive coping mechanisms. -Integrated behavioral health referral placed today  Patient and/or legal guardian verbally consented to [facility] services about presenting concerns and psychiatric consultation as appropriate.  The services will be billed as appropriate for the patient.       History of methamphetamine abuse (HCC)    She endorses a history of methamphetamine abuse.  Recently achieved 60 days of sobriety after quitting methamphetamine use on 3/4.  Counseling referral requested today. -Integrated behavioral health referral placed      Left breast lump    She has recently noted multiple lumps in her left breast.  Several of these lumps have previously been evaluated and deemed fatty tissue.  She has requested a new gynecology referral today as she does not wish to continue following at [facility]. -New gynecology referral placed today      History of recurrent miscarriages    Without clear etiology.  New gynecology referral placed today      Family history of sarcoidosis    Family medical history significant for sarcoidosis in her maternal grandmother.  She is concerned that many of the symptoms she currently has are attributable to sarcoidosis.  She has mentioned this to her previous providers, but was told that she "was not symptomatic enough" to be referred to rheumatology. -Will start with basic labs today and  further discuss at follow-up in 1 month.      Return in about 4 weeks (around 12/09/2022) for lab review.   Billie Lade, MD

## 2022-11-11 NOTE — Assessment & Plan Note (Signed)
She endorses a history of malabsorption, stating that at one point her weight was up to 270 pounds and today she weighs 177 pounds.  He describes diarrhea that seems to be triggered after eating red meat in particular.  This has never been worked up before, per patient. -Baseline labs ordered today, will add alpha gal/celiac screening labs. -Gastroenterology referral placed at patient's request

## 2022-11-12 LAB — CMP14+EGFR
Albumin: 4.7 g/dL (ref 4.0–5.0)
BUN: 14 mg/dL (ref 6–20)
CO2: 24 mmol/L (ref 20–29)
Creatinine, Ser: 0.94 mg/dL (ref 0.57–1.00)
Globulin, Total: 2.3 g/dL (ref 1.5–4.5)

## 2022-11-12 LAB — CBC WITH DIFFERENTIAL/PLATELET
Basophils Absolute: 0 10*3/uL (ref 0.0–0.2)
Hemoglobin: 16 g/dL — ABNORMAL HIGH (ref 11.1–15.9)
Lymphocytes Absolute: 2.2 10*3/uL (ref 0.7–3.1)
Lymphs: 22 %

## 2022-11-12 LAB — HEMOGLOBIN A1C: Est. average glucose Bld gHb Est-mCnc: 94 mg/dL

## 2022-11-12 LAB — LIPID PANEL
Chol/HDL Ratio: 3.3 ratio (ref 0.0–4.4)
Triglycerides: 147 mg/dL (ref 0–149)
VLDL Cholesterol Cal: 26 mg/dL (ref 5–40)

## 2022-11-12 LAB — HCV AB W REFLEX TO QUANT PCR: HCV Ab: NONREACTIVE

## 2022-11-12 LAB — HIV ANTIBODY (ROUTINE TESTING W REFLEX): HIV Screen 4th Generation wRfx: NONREACTIVE

## 2022-11-13 LAB — CBC WITH DIFFERENTIAL/PLATELET
MCV: 88 fL (ref 79–97)
Neutrophils Absolute: 6.8 10*3/uL (ref 1.4–7.0)
Platelets: 263 10*3/uL (ref 150–450)
RBC: 5.46 x10E6/uL — ABNORMAL HIGH (ref 3.77–5.28)
WBC: 9.7 10*3/uL (ref 3.4–10.8)

## 2022-11-13 LAB — CMP14+EGFR
Bilirubin Total: 0.4 mg/dL (ref 0.0–1.2)
Calcium: 10.4 mg/dL — ABNORMAL HIGH (ref 8.7–10.2)
eGFR: 86 mL/min/{1.73_m2} (ref >59)

## 2022-11-13 LAB — HCV INTERPRETATION

## 2022-11-13 LAB — LIPID PANEL
Cholesterol, Total: 190 mg/dL (ref 100–199)
HDL: 57 mg/dL (ref >39)

## 2022-11-15 LAB — CBC WITH DIFFERENTIAL/PLATELET
Basos: 0 %
EOS (ABSOLUTE): 0.1 10*3/uL (ref 0.0–0.4)
Eos: 1 %
Hematocrit: 48.1 % — ABNORMAL HIGH (ref 34.0–46.6)
Immature Grans (Abs): 0 10*3/uL (ref 0.0–0.1)
Immature Granulocytes: 0 %
MCH: 29.3 pg (ref 26.6–33.0)
MCHC: 33.3 g/dL (ref 31.5–35.7)
Monocytes Absolute: 0.5 10*3/uL (ref 0.1–0.9)
Monocytes: 5 %
Neutrophils: 72 %
RDW: 13.2 % (ref 11.7–15.4)

## 2022-11-15 LAB — CMP14+EGFR
ALT: 20 IU/L (ref 0–32)
AST: 29 IU/L (ref 0–40)
Albumin/Globulin Ratio: 2 (ref 1.2–2.2)
Alkaline Phosphatase: 66 IU/L (ref 44–121)
BUN/Creatinine Ratio: 15 (ref 9–23)
Chloride: 103 mmol/L (ref 96–106)
Glucose: 78 mg/dL (ref 70–99)
Potassium: 4.6 mmol/L (ref 3.5–5.2)
Sodium: 143 mmol/L (ref 134–144)
Total Protein: 7 g/dL (ref 6.0–8.5)

## 2022-11-15 LAB — B12 AND FOLATE PANEL
Folate: 5.4 ng/mL (ref >3.0)
Vitamin B-12: 508 pg/mL (ref 232–1245)

## 2022-11-15 LAB — HEMOGLOBIN A1C: Hgb A1c MFr Bld: 4.9 % (ref 4.8–5.6)

## 2022-11-15 LAB — TSH+FREE T4
Free T4: 1.09 ng/dL (ref 0.82–1.77)
TSH: 0.802 u[IU]/mL (ref 0.450–4.500)

## 2022-11-15 LAB — LIPID PANEL: LDL Chol Calc (NIH): 107 mg/dL — ABNORMAL HIGH (ref 0–99)

## 2022-11-15 LAB — CELIAC AB TTG DGP TIGA
Antigliadin Abs, IgA: 3 units (ref 0–19)
Gliadin IgG: 2 units (ref 0–19)
IgA/Immunoglobulin A, Serum: 80 mg/dL — ABNORMAL LOW (ref 87–352)
Tissue Transglut Ab: 2 U/mL (ref 0–5)
Transglutaminase IgA: <2 U/mL (ref 0–3)

## 2022-11-15 LAB — VITAMIN D 25 HYDROXY (VIT D DEFICIENCY, FRACTURES): Vit D, 25-Hydroxy: 26.1 ng/mL — ABNORMAL LOW (ref 30.0–100.0)

## 2022-11-16 DIAGNOSIS — Z01419 Encounter for gynecological examination (general) (routine) without abnormal findings: Secondary | ICD-10-CM | POA: Diagnosis not present

## 2022-11-24 ENCOUNTER — Encounter (INDEPENDENT_AMBULATORY_CARE_PROVIDER_SITE_OTHER): Payer: Self-pay

## 2022-11-24 ENCOUNTER — Institutional Professional Consult (permissible substitution): Payer: Medicaid Other | Admitting: Professional Counselor

## 2022-12-02 IMAGING — US US EXTREM LOW VENOUS*L*
1 series · 14 of 24 positions shown · non-contrast
Comparison: None

CLINICAL DATA: 24-year-old female with LEFT leg pain and swelling,
no history of injury.

EXAM:
LEFT LOWER EXTREMITY VENOUS DOPPLER ULTRASOUND
TECHNIQUE: Gray-scale sonography with compression, as well as color and duplex
ultrasound, were performed to evaluate the deep venous system(s)
from the level of the common femoral vein through the popliteal and
proximal calf veins.

[Series 1: us venous img lower uni left (dvt) · portal-venous · 14 of 36 slices shown]
[im 1/36]
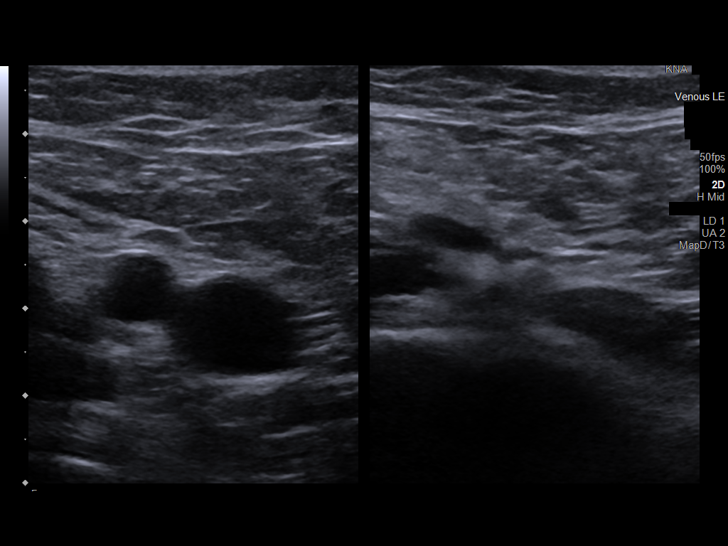
[im 4/36]
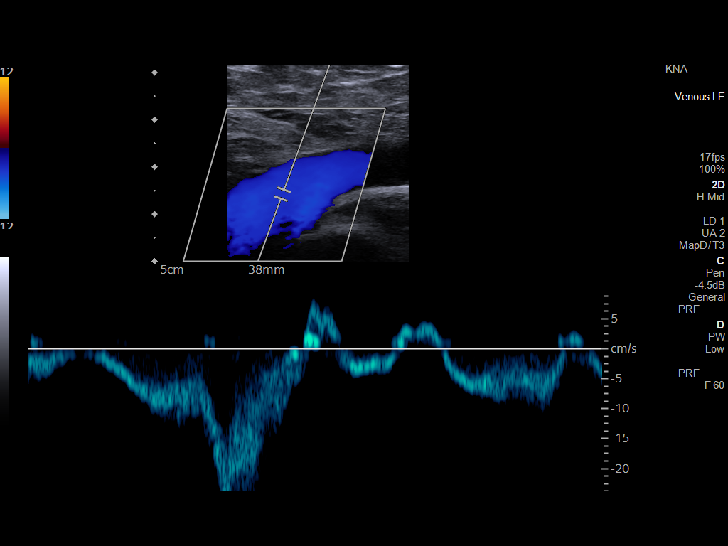
[im 7/36]
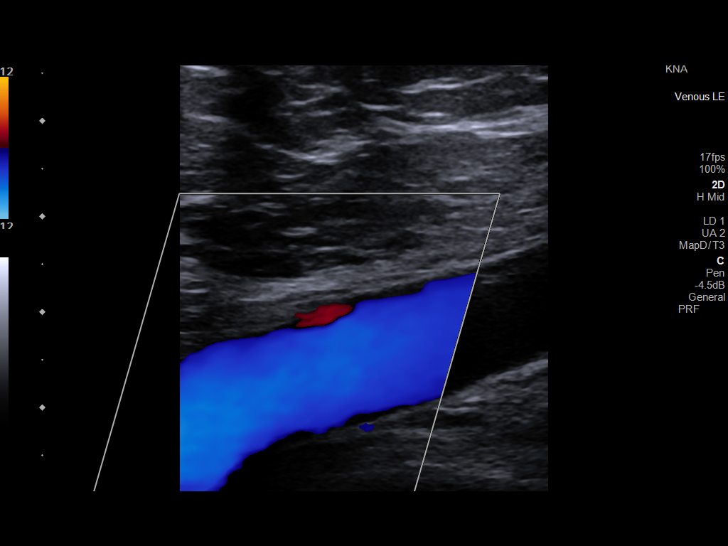
[im 10/36]
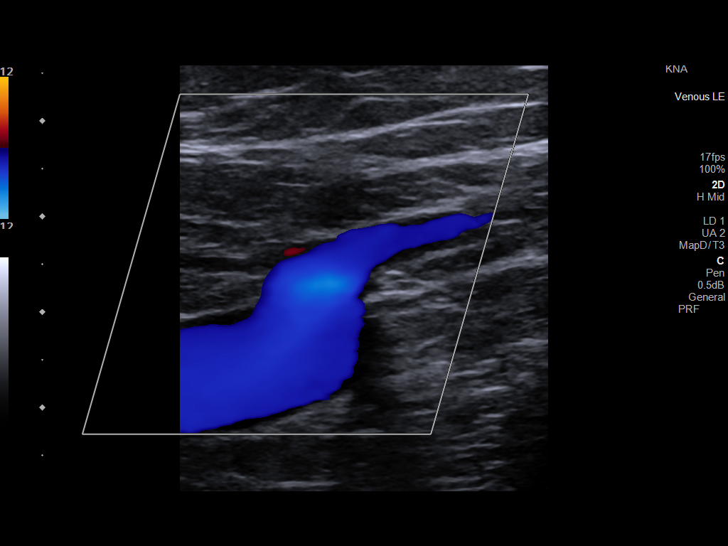
[im 11/36]
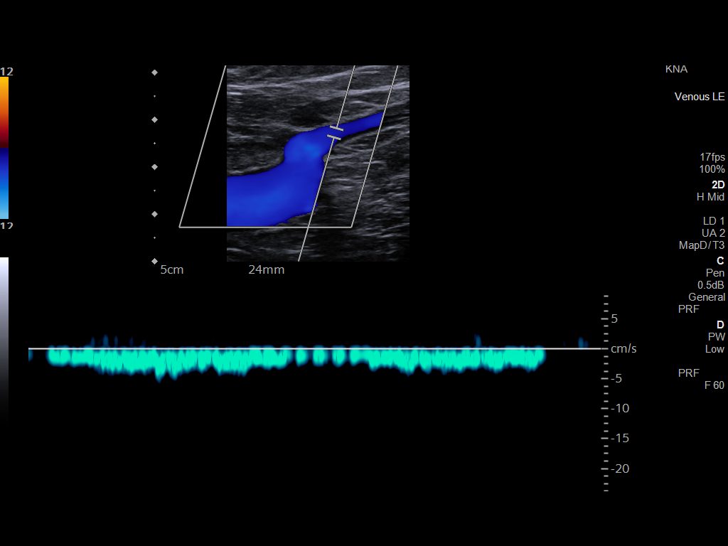
[im 14/36]
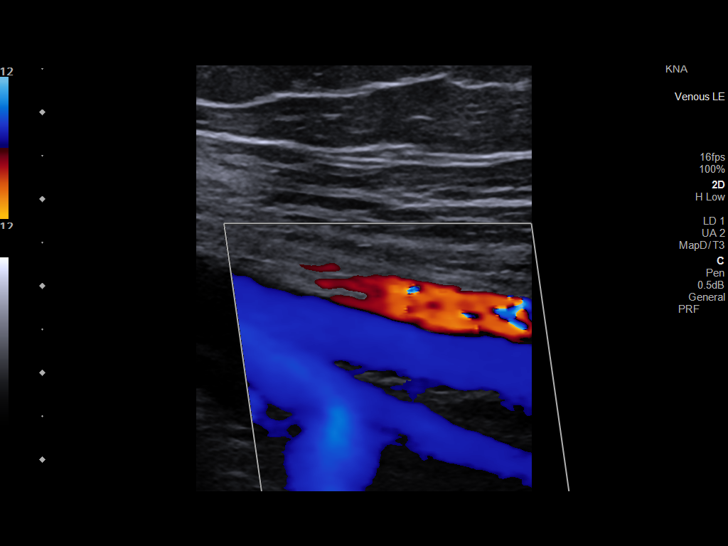
[im 17/36]
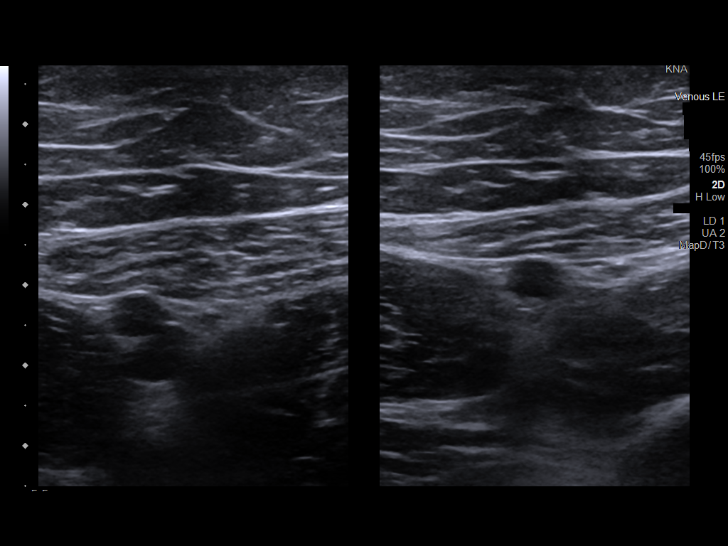
[im 19/36]
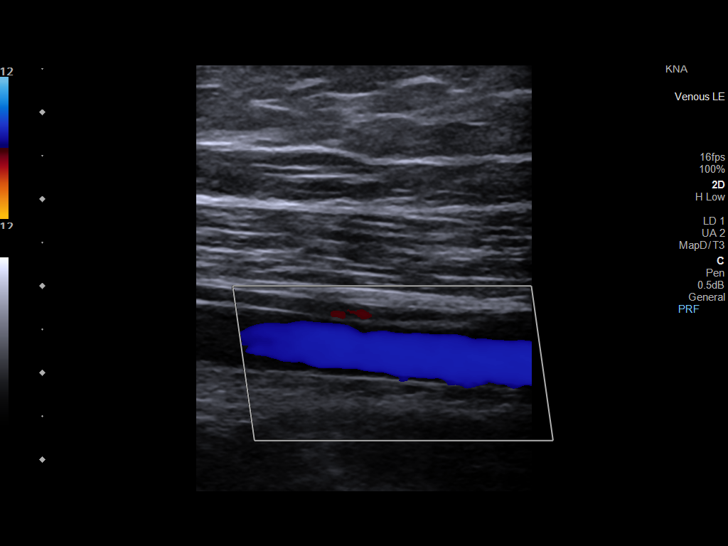
[im 22/36]
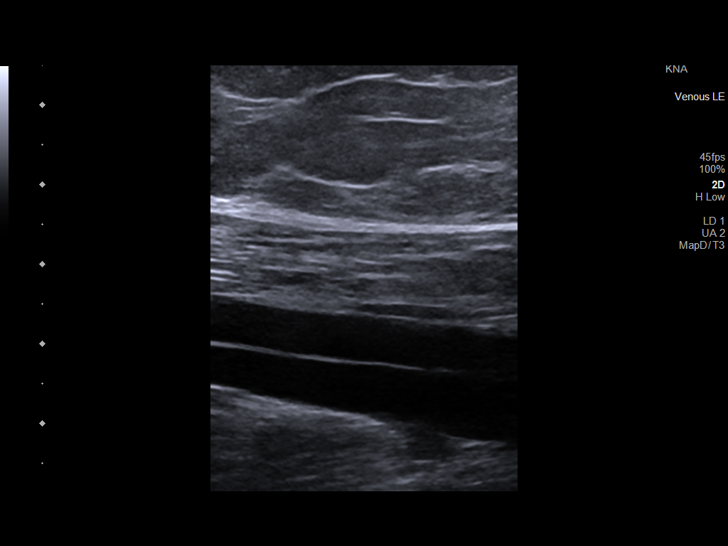
[im 25/36]
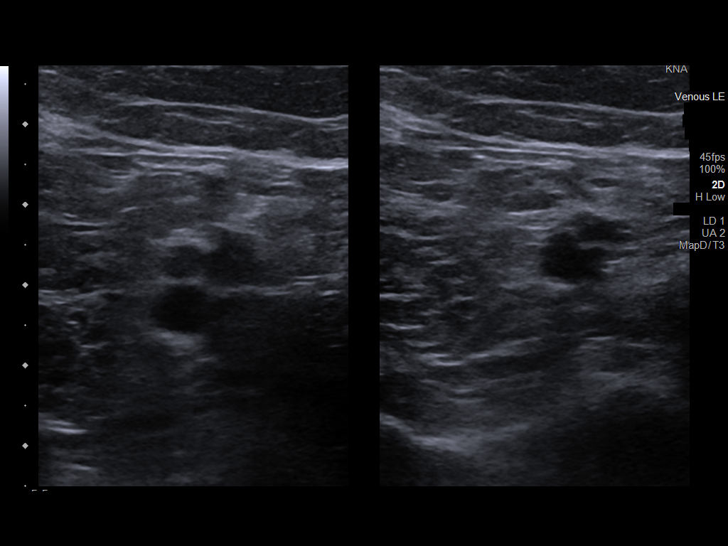
[im 28/36]
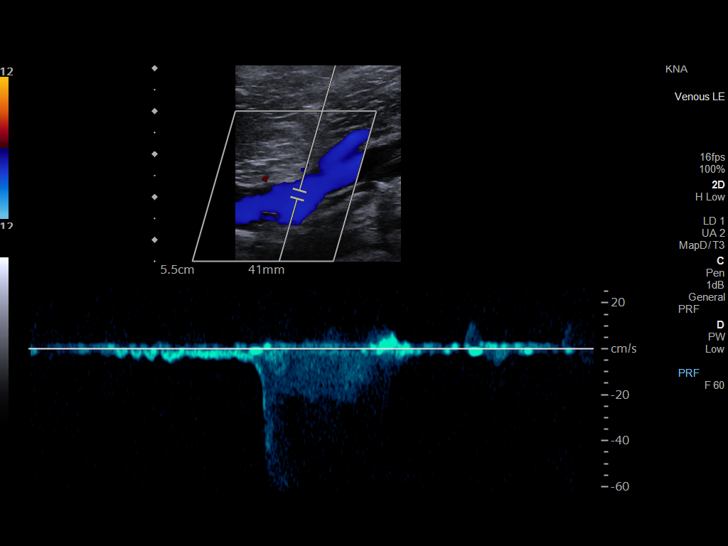
[im 29/36]
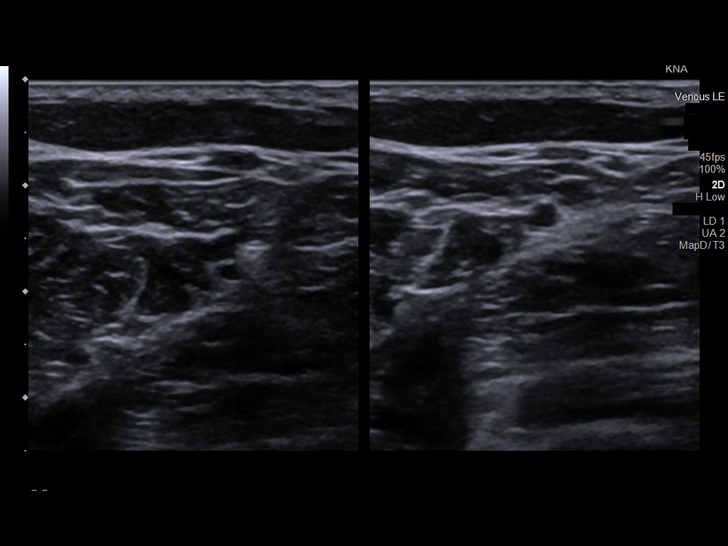
[im 32/36]
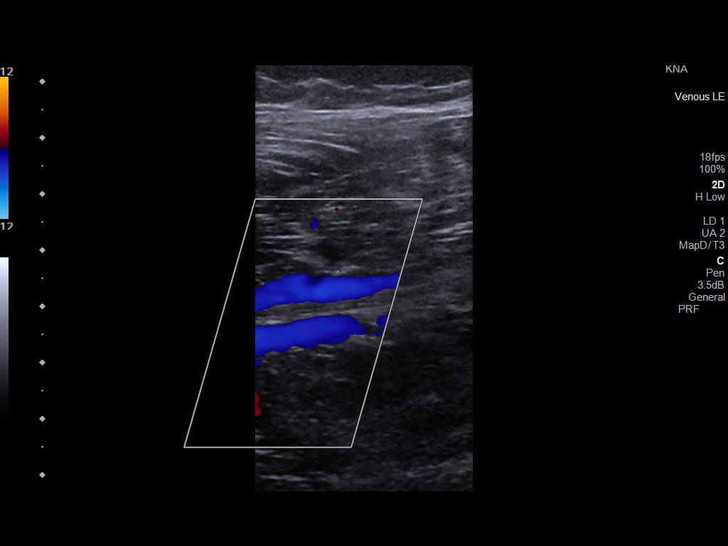
[im 36/36]
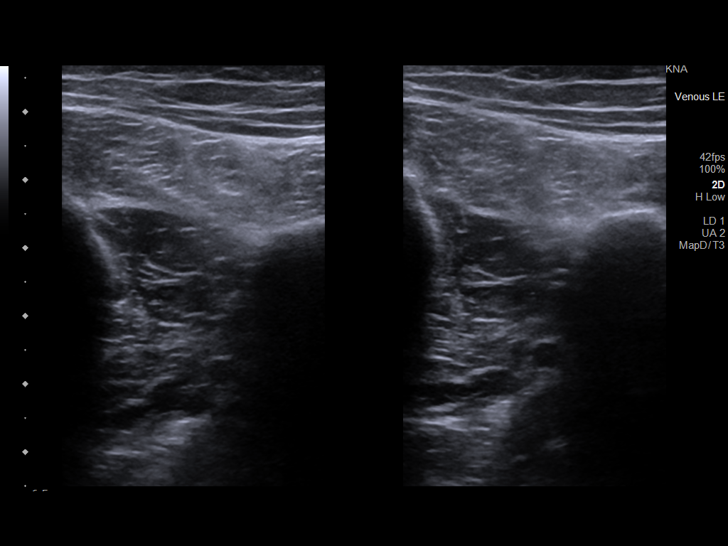

[14 of 24 positions shown; findings below may reference images not displayed]

FINDINGS: VENOUS

Normal compressibility of the common femoral, superficial femoral,
and popliteal veins, as well as the visualized calf veins.
Visualized portions of profunda femoral vein and great saphenous
vein unremarkable. No filling defects to suggest DVT on grayscale or
color Doppler imaging. Doppler waveforms show normal direction of
venous flow, normal respiratory plasticity and response to
augmentation.

Limited views of the contralateral common femoral vein are
unremarkable.

OTHER

None.

Limitations: none
IMPRESSION: Negative for DVT in the LEFT lower extremity.

## 2022-12-09 ENCOUNTER — Ambulatory Visit: Payer: Medicaid Other | Admitting: Obstetrics & Gynecology

## 2022-12-16 ENCOUNTER — Ambulatory Visit: Payer: Medicaid Other | Admitting: Internal Medicine

## 2022-12-16 ENCOUNTER — Encounter: Payer: Self-pay | Admitting: Internal Medicine

## 2022-12-16 VITALS — BP 128/78 | HR 105 | Ht 64.0 in | Wt 187.6 lb

## 2022-12-16 DIAGNOSIS — Z8669 Personal history of other diseases of the nervous system and sense organs: Secondary | ICD-10-CM | POA: Diagnosis not present

## 2022-12-16 DIAGNOSIS — L819 Disorder of pigmentation, unspecified: Secondary | ICD-10-CM

## 2022-12-16 DIAGNOSIS — N6321 Unspecified lump in the left breast, upper outer quadrant: Secondary | ICD-10-CM | POA: Diagnosis not present

## 2022-12-16 DIAGNOSIS — M797 Fibromyalgia: Secondary | ICD-10-CM

## 2022-12-16 DIAGNOSIS — J301 Allergic rhinitis due to pollen: Secondary | ICD-10-CM | POA: Diagnosis not present

## 2022-12-16 NOTE — Assessment & Plan Note (Addendum)
Currently managing her pain with gabapentin.  Pain is typically worse in the summer.  She requests a referral to physical therapy today for acupuncture as it has been effective previously and improving her symptoms.

## 2022-12-16 NOTE — Progress Notes (Signed)
Established Patient Office Visit  Subjective   Patient ID: Stasi Landmark, female    DOB: 1996-03-04  Age: 27 y.o. MRN: [id number]  Chief Complaint  Patient presents with   Results    Follow up   Ms. Cummiskey returns to care today for one month follow-up.  She was last evaluated by me on 5/10 as a new patient presenting to establish care.  She had multiple concerns to discuss at that time.  She was referred to multiple specialists, including dermatology, gastroenterology, gynecology, counseling, and neurology.  Baseline labs were ordered and 4-week follow-up was arranged for reassessment.  There have been no acute interval events.  Ms. Saffold reports feeling well today.  She is asymptomatic currently.  Her acute concerns are requesting that her dermatology referral be sent to the skin surgery center in [place].  She additionally requests a referral to physical therapy for acupuncture.  This has previously been effective in mitigating symptoms of fibromyalgia.  She also requests a referral to allergy and immunology for management of allergic rhinitis.  She states that her allergies are changing and she would like to be evaluated again by an immunologist.  Past Medical History:  Diagnosis Date   Asthma    Eczema    Fibromyalgia 2018   Heart palpitations    Mental disorder    Bipolar   Past Surgical History:  Procedure Laterality Date   NASAL SEPTUM SURGERY Bilateral 2015   SINOSCOPY     Social History   Tobacco Use   Smoking status: Former    Packs/day: .25    Types: Cigarettes    Quit date: 05/2021    Years since quitting: 1.6   Smokeless tobacco: Never  Vaping Use   Vaping Use: Former  Substance Use Topics   Alcohol use: Yes    Comment: Occasional    Drug use: Not Currently   Family History  Problem Relation Age of Onset   Sarcoidosis Maternal Grandmother    Cancer Maternal Grandfather        skin   Heart attack Father    Eczema Brother    Allergic  rhinitis Brother    Urticaria Brother    Allergies  Allergen Reactions   Mometasone Furo-Formoterol Fum     Other reaction(s): Asthma and/or Shortness of Breath, Nausea and/or Vomiting   Montelukast Hives and Shortness Of Breath   Penicillins Anaphylaxis    Family History    Prednisone     Anxiety; rash    Tricyclic Antidepressants Other (See Comments)    Panic Attacks, Asthma Flare    Venlafaxine Hcl Other (See Comments)    Blurred vision, feeling faint   Clindamycin Rash   Review of Systems  Constitutional:  Negative for chills and fever.  HENT:  Negative for sore throat.   Respiratory:  Negative for cough and shortness of breath.   Cardiovascular:  Negative for chest pain, palpitations and leg swelling.  Gastrointestinal:  Negative for abdominal pain, blood in stool, constipation, diarrhea, nausea and vomiting.  Genitourinary:  Negative for dysuria and hematuria.  Musculoskeletal:  Negative for myalgias.  Skin:  Negative for itching and rash.  Neurological:  Negative for dizziness and headaches.  Psychiatric/Behavioral:  Negative for depression and suicidal ideas.      Objective:     BP 128/78   Pulse (!) 105   Ht 5\' 4"  (1.626 m)   Wt 187 lb 9.6 oz (85.1 kg)   SpO2 97%   BMI 32.20 kg/m  BP Readings from Last 3 Encounters:  12/16/22 128/78  11/11/22 118/73  10/03/22 136/81   Physical Exam Vitals reviewed.  Constitutional:      General: She is not in acute distress.    Appearance: Normal appearance. She is obese. She is not toxic-appearing.  HENT:     Head: Normocephalic and atraumatic.     Right Ear: External ear normal.     Left Ear: External ear normal.     Nose: Nose normal. No congestion or rhinorrhea.     Mouth/Throat:     Mouth: Mucous membranes are moist.     Pharynx: Oropharynx is clear. No oropharyngeal exudate or posterior oropharyngeal erythema.  Eyes:     General: No scleral icterus.    Extraocular Movements: Extraocular movements intact.      Conjunctiva/sclera: Conjunctivae normal.     Pupils: Pupils are equal, round, and reactive to light.  Cardiovascular:     Rate and Rhythm: Normal rate and regular rhythm.     Pulses: Normal pulses.     Heart sounds: Normal heart sounds. No murmur heard.    No friction rub. No gallop.  Pulmonary:     Effort: Pulmonary effort is normal.     Breath sounds: Normal breath sounds. No wheezing, rhonchi or rales.  Abdominal:     General: Abdomen is flat. Bowel sounds are normal. There is no distension.     Palpations: Abdomen is soft.     Tenderness: There is no abdominal tenderness.  Musculoskeletal:        General: No swelling. Normal range of motion.     Cervical back: Normal range of motion.     Right lower leg: No edema.     Left lower leg: No edema.  Lymphadenopathy:     Cervical: No cervical adenopathy.  Skin:    General: Skin is warm and dry.     Capillary Refill: Capillary refill takes less than 2 seconds.     Coloration: Skin is not jaundiced.     Findings: Rash (Diffuse, hyperpigmented rash noted on neck and shoulders) present.  Neurological:     General: No focal deficit present.     Mental Status: She is alert and oriented to person, place, and time.  Psychiatric:        Mood and Affect: Mood normal.        Behavior: Behavior normal.   Last CBC Lab Results  Component Value Date   WBC 9.7 11/11/2022   HGB 16.0 (H) 11/11/2022   HCT 48.1 (H) 11/11/2022   MCV 88 11/11/2022   MCH 29.3 11/11/2022   RDW 13.2 11/11/2022   PLT 263 11/11/2022   Last metabolic panel Lab Results  Component Value Date   GLUCOSE 78 11/11/2022   NA 143 11/11/2022   K 4.6 11/11/2022   CL 103 11/11/2022   CO2 24 11/11/2022   BUN 14 11/11/2022   CREATININE 0.94 11/11/2022   EGFR 86 11/11/2022   CALCIUM 10.4 (H) 11/11/2022   PROT 7.0 11/11/2022   ALBUMIN 4.7 11/11/2022   LABGLOB 2.3 11/11/2022   AGRATIO 2.0 11/11/2022   BILITOT 0.4 11/11/2022   ALKPHOS 66 11/11/2022   AST 29  11/11/2022   ALT 20 11/11/2022   ANIONGAP 9 03/28/2022   Last lipids Lab Results  Component Value Date   CHOL 190 11/11/2022   HDL 57 11/11/2022   LDLCALC 107 (H) 11/11/2022   TRIG 147 11/11/2022   CHOLHDL 3.3 11/11/2022   Last hemoglobin  A1c Lab Results  Component Value Date   HGBA1C 4.9 11/11/2022   Last thyroid functions Lab Results  Component Value Date   TSH 0.802 11/11/2022   Last vitamin D Lab Results  Component Value Date   VD25OH 26.1 (L) 11/11/2022   Last vitamin B12 and Folate Lab Results  Component Value Date   VITAMINB12 508 11/11/2022   FOLATE 5.4 11/11/2022     Assessment & Plan:   Problem List Items Addressed This Visit       Seasonal allergic rhinitis due to pollen    Currently managing her symptoms with zafirlukast.  She has requested a new allergy and immunology referral today as she feels as though her allergies are changing. -Allergy and immunology referral placed today      Fibromyalgia    Currently managing her pain with gabapentin.  Pain is typically worse in the summer.  She requests a referral to physical therapy today for acupuncture as it has been effective previously and improving her symptoms.      Hx of migraines - Primary    Neurology appointment scheduled for August.  Patient requests to have her magnesium level checked today.       Return in about 6 months (around 06/17/2023).    Billie Lade, MD

## 2022-12-16 NOTE — Assessment & Plan Note (Signed)
Neurology appointment scheduled for August.  Patient requests to have her magnesium level checked today.

## 2022-12-16 NOTE — Assessment & Plan Note (Signed)
Currently managing her symptoms with zafirlukast.  She has requested a new allergy and immunology referral today as she feels as though her allergies are changing. -Allergy and immunology referral placed today

## 2022-12-16 NOTE — Patient Instructions (Signed)
It was a pleasure to see you today.  Thank you for giving [place] the opportunity to be involved in your care.  Below is a brief recap of your visit and next steps.  We will plan to see you again in 6 months.   Summary Referrals for physical therapy and allergy & immunology have been placed today We will check your magnesium level and follow up in 6 months

## 2022-12-17 LAB — MAGNESIUM: Magnesium: 1.9 mg/dL (ref 1.6–2.3)

## 2022-12-23 ENCOUNTER — Encounter: Payer: Self-pay | Admitting: Internal Medicine

## 2022-12-28 ENCOUNTER — Encounter: Payer: Self-pay | Admitting: *Deleted

## 2022-12-28 ENCOUNTER — Ambulatory Visit (INDEPENDENT_AMBULATORY_CARE_PROVIDER_SITE_OTHER): Payer: Medicaid Other | Admitting: Gastroenterology

## 2022-12-28 ENCOUNTER — Other Ambulatory Visit: Payer: Self-pay | Admitting: *Deleted

## 2022-12-28 ENCOUNTER — Encounter: Payer: Self-pay | Admitting: Gastroenterology

## 2022-12-28 VITALS — BP 102/75 | HR 87 | Temp 98.9°F | Ht 64.0 in | Wt 184.4 lb

## 2022-12-28 DIAGNOSIS — K529 Noninfective gastroenteritis and colitis, unspecified: Secondary | ICD-10-CM

## 2022-12-28 DIAGNOSIS — R109 Unspecified abdominal pain: Secondary | ICD-10-CM

## 2022-12-28 DIAGNOSIS — R634 Abnormal weight loss: Secondary | ICD-10-CM

## 2022-12-28 MED ORDER — PEG 3350-KCL-NA BICARB-NACL 420 G PO SOLR: 4000.0000 mL | Freq: Once | ORAL | 0 refills | Status: AC

## 2022-12-28 NOTE — Progress Notes (Unsigned)
Gastroenterology Office Note    Referring Provider: Billie Lade, MD Primary Care Physician:  Billie Lade, MD     Chief Complaint   Chief Complaint  Patient presents with   Peptic Ulcer Disease    Referred for peptic ulcer disease. 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 She is requesting alpha gal test to be done since when pcp did test it was not run. Pt states she dropped from 270 to 155 lbs in one year.      History of Present Illness   Katherine Mora is a 27 y.o. female presenting today at the request of Billie Lade, MD due to   Around 12-15 ?GI. Hx of H.pylori infection s/p treatment around ages [age]. Was told had PUD. Unknown if EGD or not. States in 2022 she saw on chart she had been diagnosed with malabsorption. 270lbs to 155 over a 9 month period. Has gained some since then up to 184. Concerned about alpha gal. Anything eating causes pain. Present for many years. Will have postprandial epigastric pain and pain as moves through system. In past would have postprandial loose, sticky, greasy stool. Greasy stool for about a year. Loose stools off and on for several years. No melena or hematochezia. Eats very small portions. Doesn't get hungry any longer  Hx of fibromyalgia: symptoms since age 32. Diagnosed at age 61.   Working on Visual merchandiser. Researching on own.   Aunt has significant sensitivity for gluten.   Vapes. Used to smoke.   Covid in Oct 2021 and Sept 2022. Has noted change in health with multiple issues. Elevated calcium.    IgA Deficiency.   Gets hypoglycemic. Had service dog that would notify her and have blood sugar in 30/40s. Going on since 2020. Chronically elevated LDL. A1c normal. Notes calcium has been elevated in the past chronically. Has rash. No itching. Turns pink/brown. Seeing Dermatology.    Past Medical History:  Diagnosis Date   Asthma    Eczema    Fibromyalgia 2018   Heart palpitations     Mental disorder    Bipolar    Past Surgical History:  Procedure Laterality Date   NASAL SEPTUM SURGERY Bilateral 2015   SINOSCOPY      Current Outpatient Medications  Medication Sig Dispense Refill   Baclofen 5 MG TABS Take by mouth. 4 tablets as needed     gabapentin (NEURONTIN) 100 MG capsule Take 1 capsule (100 mg total) by mouth in the morning. 90 capsule 0   gabapentin (NEURONTIN) 300 MG capsule Take 300 mg each afternoon and 600 mg nightly 180 capsule 0   norethindrone (MICRONOR) 0.35 MG tablet Take 1 tablet by mouth daily.     OVER THE COUNTER MEDICATION Magnessium oxide  483 mg one daily     traMADol (ULTRAM) 50 MG tablet Take 50 mg by mouth every 6 (six) hours as needed.     zafirlukast (ACCOLATE) 10 MG tablet TAKE 1 TABLET BY MOUTH TWICE A DAY. 60 tablet 0   No current facility-administered medications for this visit.    Allergies as of 12/28/2022 - Review Complete 12/28/2022  Allergen Reaction Noted   Mometasone furo-formoterol fum  12/21/2019   Montelukast Hives and Shortness Of Breath 08/04/2021   Penicillins Anaphylaxis 09/09/2020   Prednisone  02/06/2018   Tricyclic antidepressants Other (See Comments) 09/09/2020   Venlafaxine hcl Other (See Comments) 02/22/2017   Clindamycin Rash 11/06/2015    Family History  Problem Relation  Age of Onset   Sarcoidosis Maternal Grandmother    Cancer Maternal Grandfather        skin   Heart attack Father    Eczema Brother    Allergic rhinitis Brother    Urticaria Brother     Social History   Socioeconomic History   Marital status: Single    Spouse name: Not on file   Number of children: Not on file   Years of education: Not on file   Highest education level: Not on file  Occupational History   Not on file  Tobacco Use   Smoking status: Every Day    Types: E-cigarettes    Passive exposure: Current   Smokeless tobacco: Never  Vaping Use   Vaping Use: Former  Substance and Sexual Activity   Alcohol use: Not  Currently   Drug use: Not Currently   Sexual activity: Yes    Birth control/protection: None  Other Topics Concern   Not on file  Social History Narrative   Not on file   Social Determinants of Health   Financial Resource Strain: Medium Risk (02/15/2021)   Overall Financial Resource Strain (CARDIA)    Difficulty of Paying Living Expenses: Somewhat hard  Food Insecurity: Food Insecurity Present (02/15/2021)   Hunger Vital Sign    Worried About Running Out of Food in the Last Year: Sometimes true    Ran Out of Food in the Last Year: Sometimes true  Transportation Needs: No Transportation Needs (02/15/2021)   PRAPARE - Administrator, Civil Service (Medical): No    Lack of Transportation (Non-Medical): No  Physical Activity: Sufficiently Active (02/15/2021)   Exercise Vital Sign    Days of Exercise per Week: 4 days    Minutes of Exercise per Session: 60 min  Stress: Stress Concern Present (02/15/2021)   [employer] of Occupational Health - Occupational Stress Questionnaire    Feeling of Stress : To some extent  Social Connections: Moderately Isolated (02/15/2021)   Social Connection and Isolation Panel [NHANES]    Frequency of Communication with Friends and Family: Three times a week    Frequency of Social Gatherings with Friends and Family: Twice a week    Attends Religious Services: More than 4 times per year    Active Member of [employer] or Organizations: No    Attends Banker Meetings: Never    Marital Status: Never married  Intimate Partner Violence: Not At Risk (02/15/2021)   Humiliation, Afraid, Rape, and Kick questionnaire    Fear of Current or Ex-Partner: No    Emotionally Abused: No    Physically Abused: No    Sexually Abused: No     Review of Systems   Gen: Denies any fever, chills, fatigue, weight loss, lack of appetite.  CV: Denies chest pain, heart palpitations, peripheral edema, syncope.  Resp: Denies shortness of breath at rest or with  exertion. Denies wheezing or cough.  GI: Denies dysphagia or odynophagia. Denies jaundice, hematemesis, fecal incontinence. GU : Denies urinary burning, urinary frequency, urinary hesitancy MS: Denies joint pain, muscle weakness, cramps, or limitation of movement.  Derm: Denies rash, itching, dry skin Psych: Denies depression, anxiety, memory loss, and confusion Heme: Denies bruising, bleeding, and enlarged lymph nodes.   Physical Exam   BP 102/75 (BP Location: Left Arm, Patient Position: Sitting, Cuff Size: Large)   Pulse 87   Temp 98.9 F (37.2 C) (Oral)   Ht 5\' 4"  (1.626 m)   Wt 184 lb  6.4 oz (83.6 kg)   BMI 31.65 kg/m  General:   Alert and oriented. Pleasant and cooperative. Well-nourished and well-developed.  Head:  Normocephalic and atraumatic. Eyes:  Without icterus Ears:  Normal auditory acuity. Lungs:  Clear to auscultation bilaterally.  Heart:  S1, S2 present without murmurs appreciated.  Abdomen:  +BS, soft, non-tender and non-distended. No HSM noted. No guarding or rebound. No masses appreciated.  Rectal:  Deferred  Msk:  Symmetrical without gross deformities. Normal posture. Extremities:  Without edema. Neurologic:  Alert and  oriented x4;  grossly normal neurologically. Skin:  Intact without significant lesions or rashes. Psych:  Alert and cooperative. Normal mood and affect.   Assessment   Katherine Mora is a 27 y.o. female presenting today with    PLAN     Gelene Mink, PhD, ANP-BC [facility] Gastroenterology

## 2022-12-28 NOTE — Addendum Note (Signed)
Addended by: Elinor Dodge on: 12/28/2022 10:16 AM   Modules accepted: Orders

## 2022-12-28 NOTE — Addendum Note (Signed)
Addended by: Elinor Dodge on: 12/28/2022 10:25 AM   Modules accepted: Orders

## 2022-12-28 NOTE — Patient Instructions (Signed)
Please have blood work and stool test done at [facility].  We are arranging a colonoscopy and upper endoscopy with Dr. Marletta Lor in the near future.  Further recommendations once I review labs!  It was a pleasure to see you today. I want to create trusting relationships with patients and provide genuine, compassionate, and quality care. I truly value your feedback, so please be on the lookout for a survey regarding your visit with me today. I appreciate your time in completing this!         Gelene Mink, PhD, ANP-BC [facility] Gastroenterology

## 2022-12-29 LAB — IGG: IgG (Immunoglobin G), Serum: 1024 mg/dL (ref 586–1602)

## 2022-12-29 LAB — ALPHA-GAL PANEL

## 2022-12-29 LAB — C-REACTIVE PROTEIN: CRP: <1 mg/L (ref 0–10)

## 2022-12-29 LAB — IRON,TIBC AND FERRITIN PANEL: Ferritin: 73 ng/mL (ref 15–150)

## 2022-12-30 ENCOUNTER — Ambulatory Visit (INDEPENDENT_AMBULATORY_CARE_PROVIDER_SITE_OTHER): Payer: Medicaid Other | Admitting: Professional Counselor

## 2022-12-30 DIAGNOSIS — F1911 Other psychoactive substance abuse, in remission: Secondary | ICD-10-CM | POA: Diagnosis not present

## 2022-12-30 DIAGNOSIS — F909 Attention-deficit hyperactivity disorder, unspecified type: Secondary | ICD-10-CM

## 2022-12-30 DIAGNOSIS — F319 Bipolar disorder, unspecified: Secondary | ICD-10-CM
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
## 2022-12-31 LAB — ALPHA-GAL PANEL
Allergen Lamb IgE: <0.1 kU/L
Beef IgE: <0.1 kU/L
Pork IgE: <0.1 kU/L

## 2022-12-31 LAB — HOMOCYSTEINE: Homocysteine: 7.5 umol/L (ref 0.0–14.5)

## 2022-12-31 LAB — IRON,TIBC AND FERRITIN PANEL: UIBC: 227 ug/dL (ref 131–425)

## 2023-01-02 LAB — IRON,TIBC AND FERRITIN PANEL
Iron Saturation: 35 % (ref 15–55)
Iron: 122 ug/dL (ref 27–159)
Total Iron Binding Capacity: 349 ug/dL (ref 250–450)

## 2023-01-02 LAB — METHYLMALONIC ACID, SERUM: Methylmalonic Acid: 194 nmol/L (ref 0–378)

## 2023-01-02 LAB — TISSUE TRANSGLUTAMINASE, IGG: Tissue Transglut Ab: <2 U/mL (ref 0–5)

## 2023-01-02 LAB — SEDIMENTATION RATE: Sed Rate: 2 mm/hr (ref 0–32)

## 2023-01-04 ENCOUNTER — Telehealth (INDEPENDENT_AMBULATORY_CARE_PROVIDER_SITE_OTHER): Payer: Medicaid Other | Admitting: Professional Counselor

## 2023-01-04 DIAGNOSIS — F331 Major depressive disorder, recurrent, moderate: Secondary | ICD-10-CM

## 2023-01-04 NOTE — BH Specialist Note (Signed)
Behavioral Health Treatment Plan Team Note  MRN: [id number] NAME: Katherine Mora  DATE: 01/06/23  Start time: Start Time: 0245 End time: Stop Time: 0315 Total time: Total Time in Minutes (Visit): 30 Documentation Time: 15 min Total collaborative care time: 45 min Total number of Virtual BH Treatment Team Plan encounters: 4/4  Treatment Team Attendees: Dr. Vanetta Shawl and Esmond Harps  Diagnoses:    ICD-10-CM   1. Moderate episode of recurrent major depressive disorder (HCC)  F33.1       Goals, Interventions and Follow-up Plan Goals: Increase healthy adjustment to current life circumstances Interventions: CBT Cognitive Behavioral Therapy Medication Management Recommendations:  Follow-up Plan: Refer for psychiatric evaluation and discuss options for therapy and substance abuse services. [facility] will discuss and consider seeing her until she is connect to other services.   Psychiatric consultation findings:  # unspecified mood disorder # history of alcohol, methamphetamine use The patient reportedly has a history of bipolar 1 disorder, although she is currently not on any psychotropics except gabapentin. She is not interested in pharmacological treatment. She will benefit from a psychiatry referral for clarification of the diagnosis. Additionally, she will benefit from CBT and supportive therapy to address grief and trauma. We will plan to have another session to explore if our program can offer any assistance until she is seen by [facility] specialist.   Recommendation Referral to psychiatry Referral to therapy/CBT/supportive therapy BH specialist to follow up once for more assessment.  History of the present illness Presenting Problem/Current Symptoms:  The patient is a 27 year old female with a history of bipolar 1, depression, and anxiety, reporting extreme highs and lows that make daily functioning difficult. She experienced an eight-month manic episode during which she used meth daily, was  unfaithful in her relationship, and had conflict with parents. 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 She has a history of four miscarriages and expresses a desire to be a mom. The patient reports sleeping two to six hours per night and has a history of not needing much sleep. She experiences stretches of sleep deprivation during which she hears whispering, sees trails, and has some visual hallucinations. Her history of trauma is mostly related to her miscarriages. Four months ago, she got clean and sober and has been abstinent since. She reports engagement wih N.A. meetings and having a supportive system. She has no history of suicide attempts and no current suicidality. She has tried several medications but experienced adverse side effects and does not recall their names. The patient is seeking treatment to help manage her mood but is not currently interested in medications.    Screenings PHQ-9 Assessments:     12/30/2022   11:29 AM 12/30/2022   11:27 AM 11/11/2022    3:55 PM  Depression screen PHQ 2/9  Decreased Interest  1 1  Down, Depressed, Hopeless  0 0  PHQ - 2 Score  1 1  Altered sleeping  3 2  Tired, decreased energy  1 1  Change in appetite  3 1  Feeling bad or failure about yourself   0 0  Trouble concentrating  2 1  Moving slowly or fidgety/restless  0 2  Suicidal thoughts  0 0  PHQ-9 Score  10 8  Difficult doing work/chores Somewhat difficult  Somewhat difficult   GAD-7 Assessments:     12/30/2022   11:29 AM 11/11/2022    3:55 PM 02/15/2021  11:09 AM  GAD 7 : Generalized Anxiety Score  Nervous, Anxious, on Edge 2 1 0  Control/stop worrying 1 0 0  Worry too much - different things 0 0 0  Trouble relaxing 2 2 1   Restless 1 3 0  Easily annoyed or irritable 2 1 1   Afraid - awful might happen 1 0 0  Total GAD 7 Score 9 7 2   Anxiety Difficulty Somewhat difficult Somewhat difficult      Past Medical History Past Medical History:  Diagnosis Date   Asthma    bipolar 1    Bipolar   Eczema    Fibromyalgia 2018   Heart palpitations     Vital signs: There were no vitals filed for this visit.  Allergies:  Allergies as of 01/04/2023 - Review Complete 12/28/2022  Allergen Reaction Noted   Mometasone furo-formoterol fum  12/21/2019   Montelukast Hives and Shortness Of Breath 08/04/2021   Penicillins Anaphylaxis 09/09/2020   Prednisone  02/06/2018   Tricyclic antidepressants Other (See Comments) 09/09/2020   Venlafaxine hcl Other (See Comments) 02/22/2017   Clindamycin Rash 11/06/2015    Medication History Current medications:  Outpatient Encounter Medications as of 01/04/2023  Medication Sig   Baclofen 5 MG TABS Take by mouth. 4 tablets as needed   gabapentin (NEURONTIN) 100 MG capsule Take 1 capsule (100 mg total) by mouth in the morning.   gabapentin (NEURONTIN) 300 MG capsule Take 300 mg each afternoon and 600 mg nightly   norethindrone (MICRONOR) 0.35 MG tablet Take 1 tablet by mouth daily.   OVER THE COUNTER MEDICATION Magnessium oxide  483 mg one daily   traMADol (ULTRAM) 50 MG tablet Take 50 mg by mouth every 6 (six) hours as needed.   zafirlukast (ACCOLATE) 10 MG tablet TAKE 1 TABLET BY MOUTH TWICE A DAY.   No facility-administered encounter medications on file as of 01/04/2023.     Scribe for Treatment Team: Reuel Boom

## 2023-01-06 ENCOUNTER — Other Ambulatory Visit: Payer: Self-pay | Admitting: Internal Medicine

## 2023-01-06 DIAGNOSIS — F0630 Mood disorder due to known physiological condition, unspecified: Secondary | ICD-10-CM

## 2023-01-13 ENCOUNTER — Ambulatory Visit (INDEPENDENT_AMBULATORY_CARE_PROVIDER_SITE_OTHER): Payer: Medicaid Other | Admitting: Professional Counselor

## 2023-01-13 DIAGNOSIS — F39 Unspecified mood [affective] disorder: Secondary | ICD-10-CM

## 2023-01-13 NOTE — Patient Instructions (Addendum)
NA meeting link: [email or web address]  Traditional Therapy Referral Link: [email or web address]  [facility]  [address]  [place], [place] [address] [phone]

## 2023-01-13 NOTE — BH Specialist Note (Signed)
Integrated Behavioral Health via Telemedicine Visit  01/13/2023 Katherine Mora [id number]  Number of Integrated Behavioral Health Clinician visits: 3- Third Visit  Session Start time: 1030   Session End time: 1100  Total time in minutes: 30   Referring Provider: Dr. Durwin Nora Patient/Family location: Home [facility] Provider location:  All persons participating in visit: Patient and [facility] Types of Service: Collaborative care  I connected with Hellena Arrick via Telephone or Video Enabled Telemedicine Application  (Video is Caregility application) and verified that I am speaking with the correct person using two identifiers. Discussed confidentiality: Yes   I discussed the limitations of telemedicine and the availability of in person appointments.  Discussed there is a possibility of technology failure and discussed alternative modes of communication if that failure occurs.  I discussed that engaging in this telemedicine visit, they consent to the provision of behavioral healthcare and the services will be billed under their insurance.  Presenting Concerns: The patient is a 27 year old female presenting for a collaborative care follow-up. Due to a fibromyalgia flare-up, the session was conducted virtually as she was unable to come into the office. The patient reports that her current pain is primarily due to fibromyalgia, which is contributing to her depressive and anxiety symptoms. 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  She has recently started attending a new church, which she finds to be a positive outlet. She is in the process of seeking a sponsor for her recovery but has not found one yet. The behavioral health counselor encouraged her to look into local 12-step programs where she can engage and likely find a sponsor. The patient was receptive to this suggestion.  Additionally, the patient is seeking a traditional therapist  and prefers a female therapist. The behavioral health counselor will provide her with resources for therapists matching her preference. During the session, the counselor also discussed the collaborative care consultation and psychiatric consultation, informing her that a psychiatric evaluation is recommended to determine a formal diagnosis and course of treatment. The patient was open to this recommendation.  The behavioral health counselor will follow up with the patient in two weeks to ensure she gets connected to the proper therapist and psychiatric services.   Interventions: Interventions utilized:  Motivational Interviewing and CBT Cognitive Behavioral Therapy Standardized Assessments completed: GAD-7 and PHQ 2&9    01/13/2023   10:36 AM 12/30/2022   11:29 AM 12/30/2022   11:27 AM 11/11/2022    3:55 PM 02/15/2021   11:09 AM  Depression screen PHQ 2/9  Decreased Interest 0  1 1 1   Down, Depressed, Hopeless 0  0 0 1  PHQ - 2 Score 0  1 1 2   Altered sleeping 1  3 2 3   Tired, decreased energy 1  1 1 2   Change in appetite 1  3 1 2   Feeling bad or failure about yourself  1  0 0 0  Trouble concentrating 0  2 1 1   Moving slowly or fidgety/restless 0  0 2 0  Suicidal thoughts 0  0 0 0  PHQ-9 Score 4  10 8 10   Difficult doing work/chores  Somewhat difficult  Somewhat difficult        01/13/2023   10:38 AM 12/30/2022   11:29 AM 11/11/2022    3:55 PM 02/15/2021   11:09 AM  GAD 7 : Generalized Anxiety Score  Nervous, Anxious, on Edge 1 2 1  0  Control/stop worrying 0 1 0  0  Worry too much - different things 0 0 0 0  Trouble relaxing 1 2 2 1   Restless 0 1 3 0  Easily annoyed or irritable 2 2 1 1   Afraid - awful might happen 0 1 0 0  Total GAD 7 Score 4 9 7 2   Anxiety Difficulty Somewhat difficult Somewhat difficult Somewhat difficult      Plan: Follow up with behavioral health clinician on : 2 weeks  Behavioral recommendations: Seek recovery supports Referral(s): Psychiatrist  I  discussed the assessment and treatment plan with the patient and/or parent/guardian. They were provided an opportunity to ask questions and all were answered. They agreed with the plan and demonstrated an understanding of the instructions.   They were advised to call back or seek an in-person evaluation if the symptoms worsen or if the condition fails to improve as anticipated.  Reuel Boom

## 2023-01-23 ENCOUNTER — Telehealth: Payer: Self-pay | Admitting: *Deleted

## 2023-01-23 NOTE — Progress Notes (Signed)
Patient states she has been trying to reach Dr. Queen Blossom office since last week.  States she needs to reschedule her colonoscopy/EGD. Message sent to Dr. Queen Blossom office.

## 2023-01-23 NOTE — Telephone Encounter (Signed)
Carron Brazen, RN:  pt states she needs to reschedule procedure scheduled for tomorrow, 7/2

## 2023-01-24 ENCOUNTER — Ambulatory Visit (HOSPITAL_COMMUNITY): Admission: RE | Admit: 2023-01-24 | Payer: Medicaid Other | Source: Home / Self Care

## 2023-01-24 ENCOUNTER — Encounter (HOSPITAL_COMMUNITY): Admission: RE | Payer: Self-pay | Source: Home / Self Care

## 2023-01-24 SURGERY — COLONOSCOPY WITH PROPOFOL
Anesthesia: Monitor Anesthesia Care

## 2023-01-24 NOTE — Telephone Encounter (Signed)
LMTR[facility]

## 2023-01-30 ENCOUNTER — Encounter: Payer: Self-pay | Admitting: *Deleted

## 2023-01-30 NOTE — Telephone Encounter (Signed)
LM[facility]..mailed letter 

## 2023-02-01 ENCOUNTER — Other Ambulatory Visit: Payer: Self-pay

## 2023-02-01 ENCOUNTER — Ambulatory Visit: Payer: Medicaid Other | Admitting: Allergy & Immunology

## 2023-02-01 ENCOUNTER — Telehealth: Payer: Self-pay | Admitting: Internal Medicine

## 2023-02-01 ENCOUNTER — Encounter: Payer: Self-pay | Admitting: Allergy & Immunology

## 2023-02-01 VITALS — BP 122/70 | HR 118 | Temp 98.4°F | Resp 16 | Ht 64.17 in | Wt 187.5 lb

## 2023-02-01 DIAGNOSIS — T7840XA Allergy, unspecified, initial encounter: Secondary | ICD-10-CM

## 2023-02-01 DIAGNOSIS — R109 Unspecified abdominal pain: Secondary | ICD-10-CM

## 2023-02-01 DIAGNOSIS — T7840XD Allergy, unspecified, subsequent encounter: Secondary | ICD-10-CM

## 2023-02-01 DIAGNOSIS — J4520 Mild intermittent asthma, uncomplicated: Secondary | ICD-10-CM | POA: Diagnosis not present

## 2023-02-01 DIAGNOSIS — J3089 Other allergic rhinitis: Secondary | ICD-10-CM | POA: Diagnosis not present

## 2023-02-01 DIAGNOSIS — J301 Allergic rhinitis due to pollen: Secondary | ICD-10-CM

## 2023-02-01 DIAGNOSIS — F39 Unspecified mood [affective] disorder: Secondary | ICD-10-CM

## 2023-02-01 MED ORDER — EPINEPHRINE 0.3 MG/0.3ML IJ SOAJ: 0.3000 mg | INTRAMUSCULAR | 2 refills | Status: AC | PRN

## 2023-02-01 MED ORDER — CETIRIZINE HCL 10 MG PO TABS: ORAL_TABLET | ORAL | 5 refills | Status: DC

## 2023-02-01 NOTE — Telephone Encounter (Signed)
Referral placed.

## 2023-02-01 NOTE — Telephone Encounter (Signed)
  [facility]   Called in on patient behalf , office is not in network with [facility].  Patient will need new referral placed

## 2023-02-01 NOTE — Patient Instructions (Addendum)
1. Allergic reaction - Testing was slightly reactive to casein which is a part of milk. - This might be a false positive. - Testing to tomato and seafood was negative as well as tomato. - You did have some new environmental allergens compared to last time.  - Copy of testing results provided. - We are getting blood work to confirm this.  - EpiPen is up to date. - We are going to get some labs to look at stinging insect allergies and mast cell activity.   2. Mild intermittent asthma, uncomplicated - Lung testing not done today. - It seems that avoiding a lot of your triggers has really improved control of your symptoms. - Continue with albuterol 4 puffs every 4-6 hours as needed.  3. Seasonal allergic rhinitis due to pollen - Testing today showed:  horse, grasses, ragweed, weeds, trees, indoor molds, dust mites, cat, and cockroach - Copy of test results provided.  - Avoidance measures provided. - Stop taking: Claritin  - Start taking: Zyrtec (cetirizine) 10mg  tablet 1-2 times daily  - You can use an extra dose of the antihistamine, if needed, for breakthrough symptoms.  - Consider nasal saline rinses 1-2 times daily to remove allergens from the nasal cavities as well as help with mucous clearance (this is especially helpful to do before the nasal sprays are given) - Consider allergy shots as a means of long-term control. - Allergy shots "re-train" and "reset" the immune system to ignore environmental allergens and decrease the resulting immune response to those allergens (sneezing, itchy watery eyes, runny nose, nasal congestion, etc).    - Allergy shots improve symptoms in 75-85% of patients.  - We can discuss more at the next appointment if the medications are not working for you.  4. Return in about 3 months (around 05/04/2023).    Please inform us of any Emergency Department visits, hospitalizations, or changes in symptoms. Call us before going to the ED for breathing or allergy  symptoms since we might be able to fit you in for a sick visit. Feel free to contact us anytime with any questions, problems, or concerns.  It was a pleasure to meet you today!  Websites that have reliable patient information: 1. American Academy of Asthma, Allergy, and Immunology: www.aaaai.org 2. Food Allergy Research and Education (FARE): foodallergy.org 3. Mothers of Asthmatics: http://www.asthmacommunitynetwork.org 4. American College of Allergy, Asthma, and Immunology: www.acaai.org   COVID-19 Vaccine Information can be found at: [email or web address] For questions related to vaccine distribution or appointments, please email [email or web address] or call [phone].   We realize that you might be concerned about having an allergic reaction to the COVID19 vaccines. To help with that concern, WE ARE OFFERING THE COVID19 VACCINES IN OUR OFFICE! Ask the front desk for dates!     "Like" [place] on Facebook and Instagram for our latest updates!      A healthy democracy works best when [employer] participate! Make sure you are registered to vote! If you have moved or changed any of your contact information, you will need to get this updated before voting!  In some cases, you MAY be able to register to vote online: [email or web address]     Airborne Adult Perc - 02/01/23 1020     Time Antigen Placed 1020    Allergen Manufacturer Waynette Buttery    Location Back    Number of Test 55    1. Control-Buffer 50% Glycerol Negative    2. Control-Histamine 2+    3. [place] 3+  4. [place] 4+    5. Johnson 4+    6. [place] 4+    7. Meadow Fescue 4+    8. Perennial Rye 4+    9. Timothy 4+    10. Ragweed Mix 3+    11. Cocklebur Negative    12. Plantain,  English 3+    13. Baccharis 3+    14. Dog Fennel 3+    15. Russian Thistle 3+    16. Lamb's Quarters Negative    17. Sheep Sorrell Negative    18. Rough  Pigweed Negative    19. Marsh Elder, Rough Negative    20. Mugwort, Common Negative    21. Box, Elder Negative    22. Cedar, red Negative    23. Sweet Gum Negative    24. Pecan Pollen 2+    25. Pine Mix 2+    26. Walnut, Black Pollen Negative    27. Red Mulberry 3+    28. Ash Mix 3+    29. Birch Mix Negative    30. Beech American 3+    31. Cottonwood, [place] Negative    32. Hickory, White Negative    33. Maple Mix Negative    34. Oak, [place] Mix 2+    35. Sycamore Eastern 2+    36. Alternaria Alternata Negative    37. Cladosporium Herbarum Negative    38. Aspergillus Mix Negative    39. Penicillium Mix 3+    40. Bipolaris Sorokiniana (Helminthosporium) Negative    41. Drechslera Spicifera (Curvularia) Negative    42. Mucor Plumbeus Negative    43. Fusarium Moniliforme Negative    44. Aureobasidium Pullulans (pullulara) Negative    45. Rhizopus Oryzae Negative    46. Botrytis Cinera Negative    47. Epicoccum Nigrum Negative    48. Phoma Betae Negative    49. Dust Mite Mix 2+    50. Cat Hair 10,000 BAU/ml 2+    51.  Dog Epithelia Negative    52. Mixed Feathers Negative    53. Horse Epithelia 2+    54. Cockroach, German 4+    55. Tobacco Leaf Negative             13 Food Perc - 02/01/23 1021       Test Information   Time Antigen Placed 1021    Allergen Manufacturer Waynette Buttery    Location Back    Number of allergen test 13      Food   1. Peanut Omitted    2. Soybean Omitted    3. Wheat Omitted    4. Sesame Omitted    5. Milk, Cow Omitted    6. Casein Omitted    7. Egg White, Chicken Omitted    8. Shellfish Mix Omitted    9. Fish Mix Omitted    10. Cashew Omitted    11. [employer] Omitted    12. Almond Omitted    13. Hazelnut Omitted             Food Adult Perc - 02/01/23 1000     Time Antigen Placed 1052    Allergen Manufacturer Waynette Buttery    Location Back    Number of allergen test 13    5. Milk, Cow Negative    6. Casein --   2 x 10   18. Trout  Negative    19. Tuna Negative    20. Salmon Negative    21. Flounder Negative    22. Codfish  Negative    23. Shrimp Negative    24. Crab Negative    25. Lobster Negative    26. Oyster Negative    27. Scallops Negative    38. Tomato Negative              Reducing Pollen Exposure  The American Academy of Allergy, Asthma and Immunology suggests the following steps to reduce your exposure to pollen during allergy seasons.    Do not hang sheets or clothing out to dry; pollen may collect on these items. Do not mow lawns or spend time around freshly cut grass; mowing stirs up pollen. Keep windows closed at night.  Keep car windows closed while driving. Minimize morning activities outdoors, a time when pollen counts are usually at their highest. Stay indoors as much as possible when pollen counts or humidity is high and on windy days when pollen tends to remain in the air longer. Use air conditioning when possible.  Many air conditioners have filters that trap the pollen spores. Use a HEPA room air filter to remove pollen form the indoor air you breathe.  Control of Dog or Cat Allergen  Avoidance is the best way to manage a dog or cat allergy. If you have a dog or cat and are allergic to dog or cats, consider removing the dog or cat from the home. If you have a dog or cat but don't want to find it a new home, or if your family wants a pet even though someone in the household is allergic, here are some strategies that may help keep symptoms at bay:  Keep the pet out of your bedroom and restrict it to only a few rooms. Be advised that keeping the dog or cat in only one room will not limit the allergens to that room. Don't pet, hug or kiss the dog or cat; if you do, wash your hands with soap and water. High-efficiency particulate air (HEPA) cleaners run continuously in a bedroom or living room can reduce allergen levels over time. Regular use of a high-efficiency vacuum cleaner or a  central vacuum can reduce allergen levels. Giving your dog or cat a bath at least once a week can reduce airborne allergen. Control of Mold Allergen   Mold and fungi can grow on a variety of surfaces provided certain temperature and moisture conditions exist.  Outdoor molds grow on plants, decaying vegetation and soil.  The major outdoor mold, Alternaria and Cladosporium, are found in very high numbers during hot and dry conditions.  Generally, a late Summer - Fall peak is seen for common outdoor fungal spores.  Rain will temporarily lower outdoor mold spore count, but counts rise rapidly when the rainy period ends.  The most important indoor molds are Aspergillus and Penicillium.  Dark, humid and poorly ventilated basements are ideal sites for mold growth.  The next most common sites of mold growth are the bathroom and the kitchen.   Indoor (Perennial) Mold Control    Maintain humidity below 50%. Clean washable surfaces with 5% bleach solution. Remove sources e.g. contaminated carpets.    Control of Dust Mite Allergen    Dust mites play a major role in allergic asthma and rhinitis.  They occur in environments with high humidity wherever human skin is found.  Dust mites absorb humidity from the atmosphere (ie, they do not drink) and feed on organic matter (including shed human and animal skin).  Dust mites are a microscopic type of insect that you  cannot see with the naked eye.  High levels of dust mites have been detected from mattresses, pillows, carpets, upholstered furniture, bed covers, clothes, soft toys and any woven material.  The principal allergen of the dust mite is found in its feces.  A gram of dust may contain 1,000 mites and 250,000 fecal particles.  Mite antigen is easily measured in the air during house cleaning activities.  Dust mites do not bite and do not cause harm to humans, other than by triggering allergies/asthma.    Ways to decrease your exposure to dust mites in your  home:  Encase mattresses, box springs and pillows with a mite-impermeable barrier or cover   Wash sheets, blankets and drapes weekly in hot water (130 F) with detergent and dry them in a dryer on the hot setting.  Have the room cleaned frequently with a vacuum cleaner and a damp dust-mop.  For carpeting or rugs, vacuuming with a vacuum cleaner equipped with a high-efficiency particulate air (HEPA) filter.  The dust mite allergic individual should not be in a room which is being cleaned and should wait 1 hour after cleaning before going into the room. Do not sleep on upholstered furniture (eg, couches).   If possible removing carpeting, upholstered furniture and drapery from the home is ideal.  Horizontal blinds should be eliminated in the rooms where the person spends the most time (bedroom, study, television room).  Washable vinyl, roller-type shades are optimal. Remove all non-washable stuffed toys from the bedroom.  Wash stuffed toys weekly like sheets and blankets above.   Reduce indoor humidity to less than 50%.  Inexpensive humidity monitors can be purchased at most hardware stores.  Do not use a humidifier as can make the problem worse and are not recommended.  Control of Cockroach Allergen  Cockroach allergen has been identified as an important cause of acute attacks of asthma, especially in urban settings.  There are fifty-five species of cockroach that exist in the [place], however only three, the [place], [place] species produce allergen that can affect patients with Asthma.  Allergens can be obtained from fecal particles, egg casings and secretions from cockroaches.    Remove food sources. Reduce access to water. Seal access and entry points. Spray runways with 0.5-1% Diazinon or Chlorpyrifos Blow boric acid power under stoves and refrigerator. Place bait stations (hydramethylnon) at feeding sites.
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
## 2023-02-02 DIAGNOSIS — T7840XD Allergy, unspecified, subsequent encounter: Secondary | ICD-10-CM | POA: Diagnosis not present

## 2023-02-03 ENCOUNTER — Encounter: Payer: Self-pay | Admitting: Allergy & Immunology

## 2023-02-03 ENCOUNTER — Encounter: Payer: Self-pay | Admitting: Professional Counselor

## 2023-02-03 ENCOUNTER — Telehealth: Payer: Self-pay | Admitting: Professional Counselor

## 2023-02-03 DIAGNOSIS — F39 Unspecified mood [affective] disorder: Secondary | ICD-10-CM

## 2023-02-03 NOTE — Telephone Encounter (Signed)
[facility] reached out to patient to see if she got connected to psychiatry and therapy. Patient has not received appointment yet. [facility] provided additional referral. We will sign off on this patient from collaborative care.

## 2023-02-14 ENCOUNTER — Ambulatory Visit: Payer: Medicaid Other | Admitting: Psychiatry

## 2023-02-14 ENCOUNTER — Encounter: Payer: Self-pay | Admitting: Psychiatry

## 2023-02-14 VITALS — BP 117/76 | HR 74 | Ht 64.0 in | Wt 191.6 lb

## 2023-02-14 DIAGNOSIS — G43719 Chronic migraine without aura, intractable, without status migrainosus: Secondary | ICD-10-CM | POA: Diagnosis not present

## 2023-02-14 DIAGNOSIS — R519 Headache, unspecified: Secondary | ICD-10-CM

## 2023-02-14 MED ORDER — UBRELVY 100 MG PO TABS: 100.0000 mg | ORAL_TABLET | ORAL | 6 refills | Status: DC | PRN

## 2023-02-14 NOTE — Patient Instructions (Addendum)
GENERAL HEADACHE INSTRUCTIONS Headache Preventive Treatment: Please keep in mind that it takes 4-6 weeks for the medication to start working well and 2-3 months at the appropriate dose before deciding if it will be useful or not. If it is not helping at all by this time, then we will discuss other medications to try. Supplements may take 3-6 months until you see full effect.   Natural supplements: Magnesium Oxide or Magnesium Glycinate 500 mg at bed (up to 800 mg daily) Coenzyme Q10 300 mg in AM Vitamin B2- 200 mg twice a day  Add 1 supplement at a time since even natural supplements can have undesirable side effects. You can sometimes buy supplements cheaper (especially Coenzyme Q10) at www.WebmailGuide.co.za or at [employer].  Vitamins and herbs that show potential:  Magnesium: Magnesium (250 mg twice a day or 500 mg at bed) has a relaxant effect on smooth muscles such as blood vessels. Individuals suffering from frequent or daily headache usually have low magnesium levels which can be increase with daily supplementation of 400-750 mg. Three trials found 40-90% average headache reduction  when used as a preventative. Magnesium also demonstrated the benefit in menstrually related migraine.  Magnesium is part of the messenger system in the serotonin cascade and it is a good muscle relaxant.  It is also useful for constipation which can be a side effect of other medications used to treat migraine. Good sources include nuts, whole grains, and tomatoes. Side Effects: loose stool/diarrhea Riboflavin (vitamin B 2) 200 mg twice a day. This vitamin assists nerve cells in the production of ATP a principal energy storing molecule.  It is necessary for many chemical reactions in the body.  There have been at least 3 clinical trials of riboflavin using 400 mg per day all of which suggested that migraine frequency can be decreased.  All 3 trials showed significant improvement in over half of migraine sufferers.  The  supplement is found in bread, cereal, milk, meat, and poultry.  Most Americans get more riboflavin than the recommended daily allowance, however riboflavin deficiency is not necessary for the supplements to help prevent headache. Side effects: energizing, green urine  Coenzyme Q10: This is present in almost all cells in the body and is critical component for the conversion of energy.  Recent studies have shown that a nutritional supplement of CoQ10 can reduce the frequency of migraine attacks by improving the energy production of cells as with riboflavin.  Doses of 150 mg twice a day have been shown to be effective.  HEADACHE DIET: Foods and beverages which may trigger migraine Note that only 20% of headache patients are food sensitive. You will know if you are food sensitive if you get a headache consistently 20 minutes to 2 hours after eating a certain food. Only cut out a food if it causes headaches, otherwise you might remove foods you enjoy! What matters most for diet is to eat a well balanced healthy diet full of vegetables and low fat protein, and to not miss meals.  Chocolate, other sweets ALL cheeses except cottage and cream cheese Dairy products, yogurt, sour cream, ice cream Liver Meat extracts (Bovril, Marmite, meat tenderizers) Meats or fish which have undergone aging, fermenting, pickling or smoking. These include: Hotdogs,salami,Lox,sausage, mortadellas,smoked salmon, pepperoni, Pickled herring Pods of broad bean (English beans, Chinese pea pods, [place] (fava) beans, lima and navy beans Ripe avocado, ripe banana Yeast extracts or active yeast preparations such as Brewer's or Fleishman's (commercial bakes goods are permitted) Tomato  based foods, pizza (lasagna, etc.)  MSG (monosodium glutamate) is disguised as many things; look for these common aliases: Monopotassium glutamate Autolysed yeast Hydrolysed protein Sodium caseinate "flavorings" "all natural  preservatives" Nutrasweet  Avoid all other foods that convincingly provoke headaches.  Resources: The Dizzy Lu Duffel Your Headache Diet, migrainestrong.com  https://www.aguirre.org/  Caffeine and Migraine For patients that have migraine, caffeine intake more than 3 days per week can lead to dependency and increased migraine frequency. I would recommend cutting back on your caffeine intake as best you can. The recommended amount of caffeine is 200-300 mg daily, although migraine patients may experience dependency at even lower doses. While you may notice an increase in headache temporarily, cutting back will be helpful for headaches in the long run. For more information on caffeine and migraine, visit: https://americanmigrainefoundation.org/resource-library/caffeine-and-migraine/  Headache Prevention Strategies:  1. Maintain a headache diary; learn to identify and avoid triggers.  - This can be a simple note where you log when you had a headache, associated symptoms, and medications used - There are several smartphone apps developed to help track migraines: Migraine Buddy, Migraine Monitor, Curelator N1-Headache App  Common triggers include: Emotional triggers: Emotional/Upset family or friends Emotional/Upset occupation Business reversal/success Anticipation anxiety Crisis-serious Post-crisis periodNew job/position   Physical triggers: Vacation Day Weekend Strenuous Exercise High Altitude Location New Move Menstrual Day Physical Illness Oversleep/Not enough sleep Weather changes Light: Photophobia or light sesnitivity treatment involves a balance between desensitization and reduction in overly strong input. Use dark polarized glasses outside, but not inside. Avoid bright or fluorescent light, but do not dim environment to the point that going into a normally lit room hurts. Consider FL-41 tint lenses, which reduce the most  irritating wavelengths without blocking too much light.  These can be obtained at axonoptics.com or theraspecs.com Foods: see list above.  2. Limit use of acute treatments (over-the-counter medications, triptans, etc.) to no more than 2 days per week or 10 days per month to prevent medication overuse headache (rebound headache).    3. Follow a regular schedule (including weekends and holidays): Don't skip meals. Eat a balanced diet. 8 hours of sleep nightly. Minimize stress. Exercise 30 minutes per day. Being overweight is associated with a 5 times increased risk of chronic migraine. Keep well hydrated and drink 6-8 glasses of water per day.  4. Initiate non-pharmacologic measures at the earliest onset of your headache. Rest and quiet environment. Relax and reduce stress. Breathe2Relax is a free app that can instruct you on    some simple relaxtion and breathing techniques. Http://Dawnbuse.com is a    free website that provides teaching videos on relaxation.  Also, there are  many apps that   can be downloaded for "mindful" relaxation.  An app called YOGA NIDRA will help walk you through mindfulness. Another app called Calm can be downloaded to give you a structured mindfulness guide with daily reminders and skill development. Headspace for guided meditation Mindfulness Based Stress Reduction Online Course: www.palousemindfulness.com Cold compresses.  5. Don't wait!! Take the maximum allowable dosage of prescribed medication at the first sign of migraine.  6. Compliance:  Take prescribed medication regularly as directed and at the first sign of a migraine.  7. Communicate:  Call your physician when problems arise, especially if your headaches change, increase in frequency/severity, or become associated with neurological symptoms (weakness, numbness, slurred speech, etc.).  8. Headache/pain management therapies: Consider various complementary methods, including medication, behavioral therapy,  psychological counselling, biofeedback, massage therapy, acupuncture, dry needling,  and other modalities.  Such measures may reduce the need for medications. Counseling for pain management, where patients learn to function and ignore/minimize their pain, seems to work very well.  9. Recommend changing family's attention and focus away from patient's headaches. Instead, emphasize daily activities. If first question of day is 'How are your headaches/Do you have a headache today?', then patient will constantly think about headaches, thus making them worse. Goal is to re-direct attention away from headaches, toward daily activities and other distractions.  10. Helpful Websites: www.AmericanHeadacheSociety.org VoipObserver.it www.headaches.org GolfingFamily.no www.achenet.org

## 2023-02-14 NOTE — Progress Notes (Addendum)
Referring:  Billie Lade, MD [address] [place],  [place] [address]  PCP: Billie Lade, MD  Neurology was asked to evaluate Katherine Mora, a 27 year old female for a chief complaint of headaches.  Our recommendations of care will be communicated by shared medical record.    CC:  headaches  History provided from self  HPI:  Medical co-morbidities: asthma, bipolar disorder, fibromyalgia, nephrolithiasis  The patient presents for evaluation of headaches which began when she was a child. Migraines have been worsening over the past 6 months. She has noticed increasingly blurry vision over the past year as well. Her migraines are either frontal or occipital. They are associated with photophobia, phonophobia, and nausea. She used to have a visual aura, but has not had one recently. She currently has 2-3 migraines per week. They can last 2-4 days at a time.  Headache History: Onset: childhood Aura: used to have visual aura, has not had one recently Associated Symptoms:  Photophobia: yes  Phonophobia: yes  Nausea: yes Other symptoms: yes Worse with activity?: yes Duration of headaches: 2-4 days  Migraine days per month: 14 Headache free days per month: 16  Current Treatment: Abortive none  Preventative none  Prior Therapies                                 Prevention: Topamax - lack of efficacy Elavil - lack of efficacy Effexor Verapamil - lack of efficacy Gabapentin 100/300/600 Prednisone - anxiety, rash  Rescue: Maxalt - made headaches worse Naratriptan - made headaches worse baclofen Tramadol Zofran  LABS: CBC    Component Value Date/Time   WBC 9.7 11/11/2022 1553   WBC 11.8 (H) 03/28/2022 0349   RBC 5.46 (H) 11/11/2022 1553   RBC 5.36 (H) 03/28/2022 0349   HGB 16.0 (H) 11/11/2022 1553   HCT 48.1 (H) 11/11/2022 1553   PLT 263 11/11/2022 1553   MCV 88 11/11/2022 1553   MCH 29.3 11/11/2022 1553   MCH 30.2 03/28/2022 0349   MCHC 33.3  11/11/2022 1553   MCHC 33.7 03/28/2022 0349   RDW 13.2 11/11/2022 1553   LYMPHSABS 2.2 11/11/2022 1553   EOSABS 0.1 11/11/2022 1553   BASOSABS 0.0 11/11/2022 1553      Latest Ref Rng & Units 11/11/2022    3:53 PM 03/28/2022    3:49 AM 02/15/2021   12:02 PM  CMP  Glucose 70 - 99 mg/dL 78  604  76   BUN 6 - 20 mg/dL 14  12  12    Creatinine 0.57 - 1.00 mg/dL 5.40  9.81  1.91   Sodium 134 - 144 mmol/L 143  141  139   Potassium 3.5 - 5.2 mmol/L 4.6  3.4  4.6   Chloride 96 - 106 mmol/L 103  108  102   CO2 20 - 29 mmol/L 24  24  24    Calcium 8.7 - 10.2 mg/dL 47.8  9.7  29.5   Total Protein 6.0 - 8.5 g/dL 7.0  7.9  7.3   Total Bilirubin 0.0 - 1.2 mg/dL 0.4  0.7  0.4   Alkaline Phos 44 - 121 IU/L 66  56  72   AST 0 - 40 IU/L 29  17  16    ALT 0 - 32 IU/L 20  17  13       IMAGING:  MRI brain 07/18/19: unremarkable   Current Outpatient Medications  on File Prior to Visit  Medication Sig Dispense Refill   Baclofen 5 MG TABS Take by mouth. 4 tablets as needed     cetirizine (ZYRTEC ALLERGY) 10 MG tablet Take 1-2 tablets by mouth daily 60 tablet 5   cromolyn (OPTICROM) 4 % ophthalmic solution      EPINEPHrine (EPIPEN 2-PAK) 0.3 mg/0.3 mL IJ SOAJ injection Inject 0.3 mg into the muscle as needed for anaphylaxis. 2 each 2   gabapentin (NEURONTIN) 100 MG capsule Take 1 capsule (100 mg total) by mouth in the morning. 90 capsule 0   gabapentin (NEURONTIN) 300 MG capsule Take 300 mg each afternoon and 600 mg nightly 180 capsule 0   norethindrone (MICRONOR) 0.35 MG tablet Take 1 tablet by mouth daily.     OVER THE COUNTER MEDICATION Magnessium oxide  483 mg one daily     RESTASIS 0.05 % ophthalmic emulsion 1 drop 2 (two) times daily.     traMADol (ULTRAM) 50 MG tablet Take 50 mg by mouth every 6 (six) hours as needed.     VENTOLIN HFA 108 (90 Base) MCG/ACT inhaler SMARTSIG:1-2 Puff(s) Via Inhaler Every 4-6 Hours PRN     zafirlukast (ACCOLATE) 10 MG tablet TAKE 1 TABLET BY MOUTH TWICE A DAY. 60  tablet 0   No current facility-administered medications on file prior to visit.     Allergies: Allergies  Allergen Reactions   Mometasone Furo-Formoterol Fum     Other reaction(s): Asthma and/or Shortness of Breath, Nausea and/or Vomiting   Montelukast Hives and Shortness Of Breath   Penicillins Anaphylaxis    Family History    Prednisone     Anxiety; rash    Tricyclic Antidepressants Other (See Comments)    Panic Attacks, Asthma Flare    Venlafaxine Hcl Other (See Comments)    Blurred vision, feeling faint   Clindamycin Rash    Family History: Family History  Problem Relation Age of Onset   Heart attack Father    Diabetes Father    Eczema Brother    Allergic rhinitis Brother    Urticaria Brother    Sarcoidosis Maternal Grandmother    Kidney failure Maternal Grandmother    Cancer Maternal Grandfather        skin   Colon cancer Neg Hx    Colon polyps Neg Hx    Aunt had brain cancer  Past Medical History: Past Medical History:  Diagnosis Date   Asthma    bipolar 1    Bipolar   Eczema    Fibromyalgia 2018   Heart palpitations    Hypoglycemia    Kidney stones     Past Surgical History Past Surgical History:  Procedure Laterality Date   NASAL SEPTUM SURGERY Bilateral 2015   SINOSCOPY      Social History: Social History   Tobacco Use   Smoking status: Former    Types: Cigarettes    Passive exposure: Current   Smokeless tobacco: Never  Vaping Use   Vaping status: Every Day  Substance Use Topics   Alcohol use: Not Currently   Drug use: Not Currently    ROS: Negative for fevers, chills. Positive for headaches. All other systems reviewed and negative unless stated otherwise in HPI.   Physical Exam:   Vital Signs: BP 117/76 (BP Location: Left Arm, Patient Position: Sitting, Cuff Size: Normal)   Pulse 74   Ht 5\' 4"  (1.626 m)   Wt 191 lb 9.6 oz (86.9 kg)   BMI 32.89 kg/m  GENERAL: well appearing,in no acute distress,alert SKIN:  Color,  texture, turgor normal. No rashes or lesions HEAD:  Normocephalic/atraumatic. CV:  RRR RESP: Normal respiratory effort MSK: +tenderness to palpation over bilateral occiput, neck, and shoulders  NEUROLOGICAL: Mental Status: Alert, oriented to person, place and time,Follows commands Cranial Nerves: PERRL, visual fields intact to confrontation, extraocular movements intact, facial sensation intact, no facial droop or ptosis, hearing grossly intact, no dysarthria, palate elevate symmetrically, tongue protrudes midline, shoulder shrug intact and symmetric Motor: muscle strength 5/5 both upper and lower extremities,no drift, normal tone Reflexes: 2+ throughout Sensation: intact to light touch all 4 extremities Coordination: Finger-to- nose-finger intact bilaterally Gait: normal-based   IMPRESSION: 27 year old female with a history of asthma, bipolar disorder, fibromyalgia, nephrolithiasis who presents for evaluation of migraines. Will order MRI brain as her headaches have been worsening over the past 6 months and have not responded to treatment. The patient is hesitant to start any new preventives at this time. Discussed supplement information for migraine prevention. Will prescribe Ubrelvy for rescue as she has failed 2 triptans previously.  PLAN: -MRI brain -Rescue: Start Ubrelvy 100 mg PRN -Supplement information provided -Window tint waiver form completed   I spent a total of 36 minutes chart reviewing and counseling the patient. Headache education was done. Discussed treatment options including preventive and acute medications, and natural supplements. Discussed medication overuse headache and to limit use of acute treatments to no more than 2 days/week or 10 days/month. Discussed medication side effects, adverse reactions and drug interactions. Written educational materials and patient instructions outlining all of the above were given.  Follow-up: 5 months   Ocie Doyne,  MD 02/14/2023   12:07 PM

## 2023-02-20 DIAGNOSIS — Z5181 Encounter for therapeutic drug level monitoring: Secondary | ICD-10-CM | POA: Diagnosis not present

## 2023-02-20 DIAGNOSIS — Z79899 Other long term (current) drug therapy: Secondary | ICD-10-CM | POA: Diagnosis not present

## 2023-02-20 DIAGNOSIS — F3160 Bipolar disorder, current episode mixed, unspecified: Secondary | ICD-10-CM | POA: Diagnosis not present

## 2023-02-27 ENCOUNTER — Telehealth: Payer: Self-pay | Admitting: Psychiatry

## 2023-02-27 DIAGNOSIS — R519 Headache, unspecified: Secondary | ICD-10-CM

## 2023-02-27 DIAGNOSIS — G43719 Chronic migraine without aura, intractable, without status migrainosus: Secondary | ICD-10-CM

## 2023-02-27 NOTE — Telephone Encounter (Signed)
[employer]: [phone] exp. 02/27/23-04/27/23 sent to GI [phone]

## 2023-02-28 ENCOUNTER — Other Ambulatory Visit (HOSPITAL_COMMUNITY): Payer: Self-pay

## 2023-02-28 ENCOUNTER — Telehealth: Payer: Self-pay

## 2023-02-28 NOTE — Telephone Encounter (Signed)
Pharmacy Patient Advocate Encounter   Received notification from CoverMyMeds that prior authorization for Ubrelvy 100MG  tablets is required/requested.   Insurance verification completed.   The patient is insured through [facility] .   Per test claim: PA required; PA submitted to [facility] via CoverMyMeds Key/confirmation #/EOC [id number] Status is pending

## 2023-03-03 ENCOUNTER — Other Ambulatory Visit: Payer: Self-pay | Admitting: Internal Medicine

## 2023-03-03 DIAGNOSIS — Z8739 Personal history of other diseases of the musculoskeletal system and connective tissue: Secondary | ICD-10-CM

## 2023-03-03 NOTE — Telephone Encounter (Signed)
Pharmacy Patient Advocate Encounter  Received notification from [facility] that Prior Authorization for Ubrelvy 100MG  tablets has been DENIED.  Full denial letter will be uploaded to the media tab. See denial reason below.  The request for the above-identified member is denied for the following reason: because we did not see what we need to approve the drug you asked for, Bernita Raisin). We may be able to approve this drug in a certain situation (when you have had a headache frequency of less than 15 headache days per month during the prior 6 months). We do not see that this applies to you.   PA #/Case ID/Reference #: [id number]  Please be advised we currently do not have a Pharmacist to review denials, therefore you will need to process appeals accordingly as needed. Thanks for your support at this time. Contact for appeals are as follows: Phone: [phone] to request a peer-to-peer, Fax: xxx

## 2023-03-07 NOTE — Telephone Encounter (Signed)
This one was an error on my part as I documented it wrong it my note. I've addended my note now so it says she's having 14 headache days per month. Can we appeal? Thanks!

## 2023-03-08 IMAGING — US US BREAST*L* LIMITED INC AXILLA
1 series · 4 of 4 positions shown · non-contrast
Comparison: None.

CLINICAL DATA: 24-year-old female with a palpable area of concern
in the upper-outer left breast felt by her provider.

EXAM:
ULTRASOUND OF THE LEFT BREAST

[Series 1: us breast*left* limited inc axilla · 0.07mm/px · 4 of 4 slices shown]
[im 1/4]
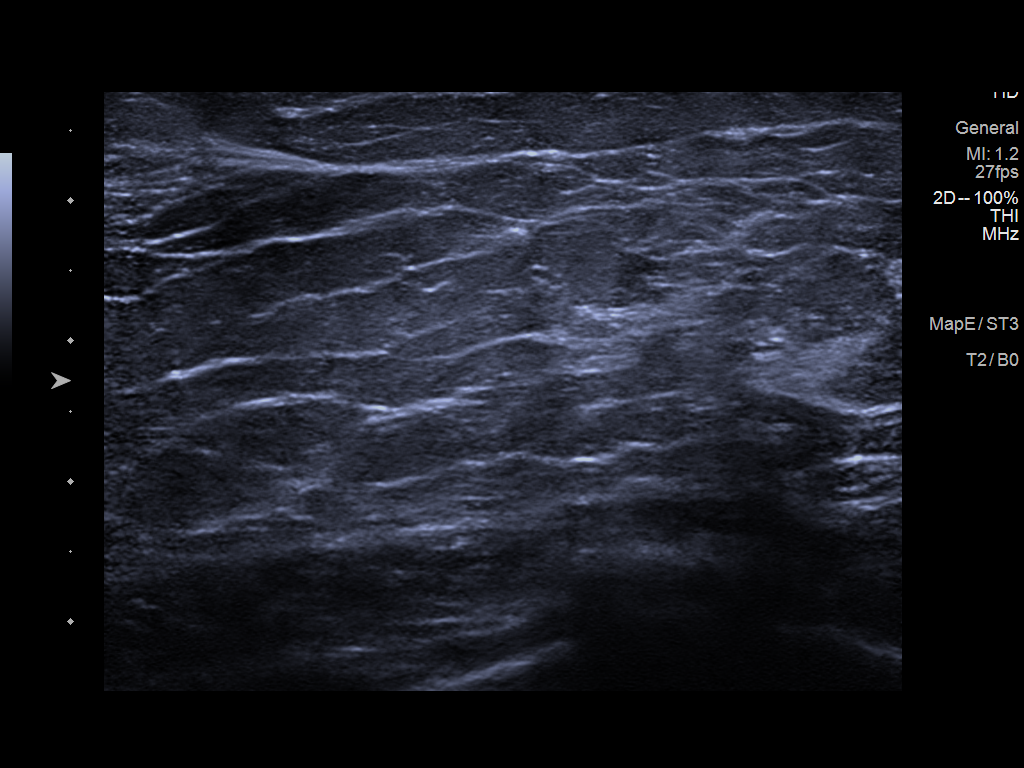
[im 2/4]
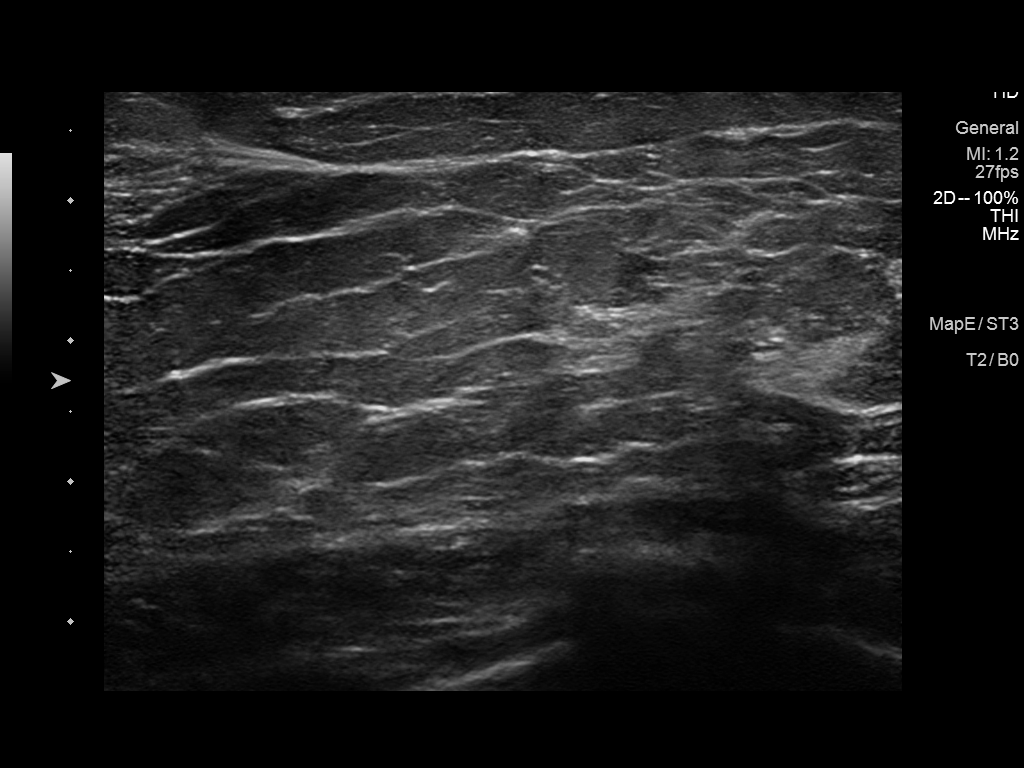
[im 3/4]
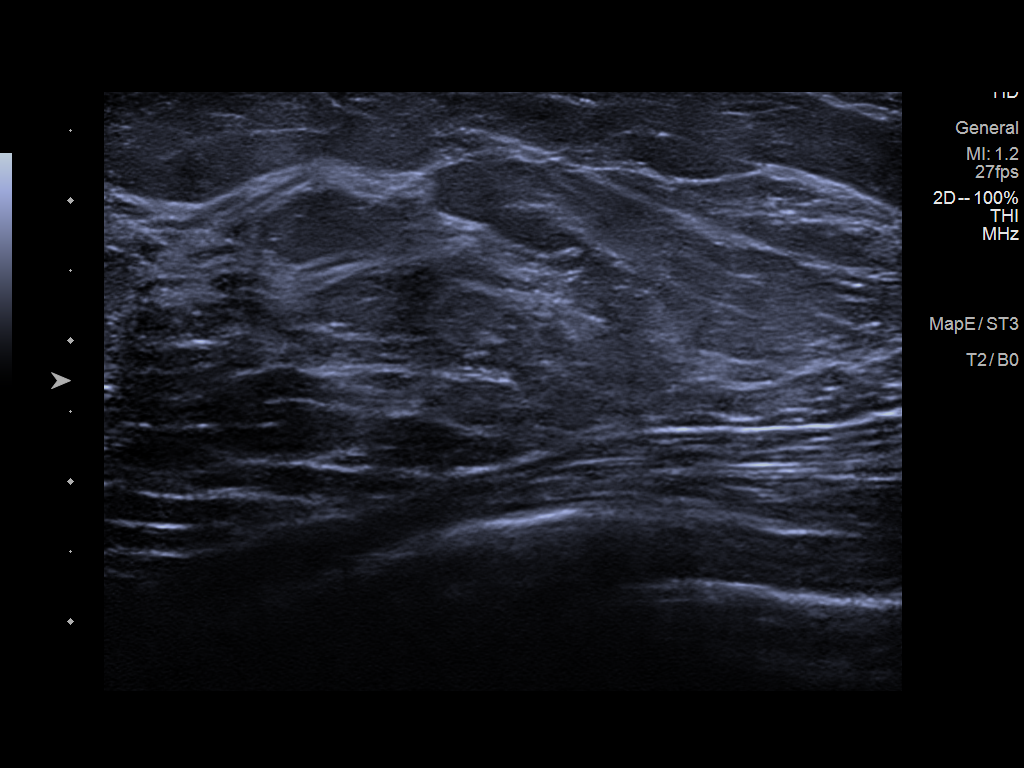
[im 4/4]
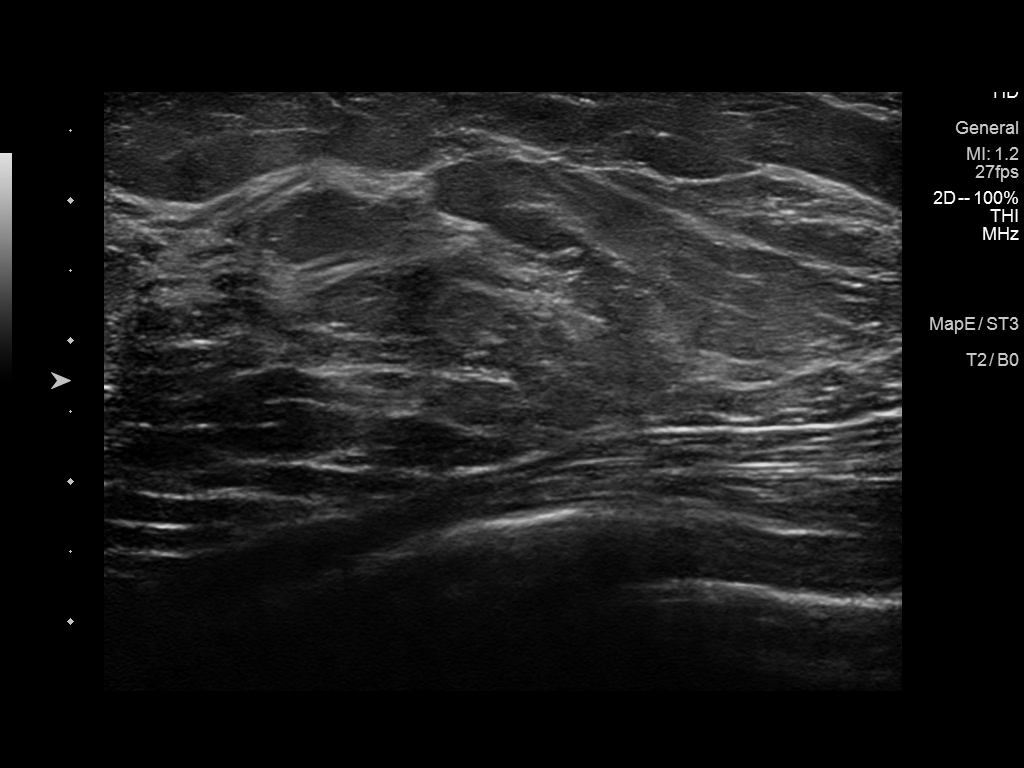

[4 of 4 positions shown; findings below may reference images not displayed]

FINDINGS: Physical examination of the upper-outer left breast does not reveal
any definite.

Targeted ultrasound of the left breast was performed. No suspicious
masses or abnormality seen, only heterogeneous fibroglandular tissue
identified.
IMPRESSION: No sonographic abnormalities at site of palpable concern in the
upper-outer left breast.

RECOMMENDATION:
1. Recommend further management of the left breast palpable area of
concern be based on clinical assessment.

2. Screening mammogram at age 41 unless there are persistent or
intervening clinical concerns. (Code:WE-B-QH2)

I have discussed the findings and recommendations with the patient.
If applicable, a reminder letter will be sent to the patient
regarding the next appointment.

BI-RADS CATEGORY  1: Negative.

## 2023-03-09 ENCOUNTER — Other Ambulatory Visit (HOSPITAL_COMMUNITY): Payer: Self-pay

## 2023-03-09 NOTE — Telephone Encounter (Signed)
Pharmacy Patient Advocate Encounter  Received notification from [facility] that Prior Authorization for Ubrelvy 100MG  tablets has been APPROVED from 03/09/2023 to 03/07/2024.   PA #/Case ID/Reference #: PA Case ID #: [id number]

## 2023-03-09 NOTE — Telephone Encounter (Signed)
Pharmacy Patient Advocate Encounter   Received notification from Physician's Office that prior authorization for Ubrelvy 100MG  tablets is required/requested.   Insurance verification completed.   The patient is insured through [facility] .   Per test claim: PA required; PA submitted to [facility] via CoverMyMeds Key/confirmation #/EOC [id number] Status is pending

## 2023-03-10 ENCOUNTER — Other Ambulatory Visit: Payer: Medicaid Other

## 2023-03-17 ENCOUNTER — Other Ambulatory Visit: Payer: Self-pay | Admitting: Family Medicine

## 2023-03-17 ENCOUNTER — Telehealth: Payer: Self-pay | Admitting: Internal Medicine

## 2023-03-17 DIAGNOSIS — Z8739 Personal history of other diseases of the musculoskeletal system and connective tissue: Secondary | ICD-10-CM

## 2023-03-17 DIAGNOSIS — H5213 Myopia, bilateral: Secondary | ICD-10-CM | POA: Diagnosis not present

## 2023-03-17 MED ORDER — GABAPENTIN 300 MG PO CAPS
ORAL_CAPSULE | ORAL | 0 refills | Status: DC
Start: 2023-03-17 — End: 2023-10-10

## 2023-03-17 NOTE — Telephone Encounter (Signed)
Patient here needing a refill on gabapentin (NEURONTIN) 300 MG capsule [[id number]] ran out on Wednesday. Please advise [facility] on [address] Thank you

## 2023-04-14 ENCOUNTER — Telehealth: Payer: Self-pay | Admitting: Internal Medicine

## 2023-04-14 NOTE — Telephone Encounter (Signed)
Medical examination form help with food stamps saying disabled.  Noted Copied Sleeved (put in provider box)  Call patient when ready

## 2023-04-17 DIAGNOSIS — F3160 Bipolar disorder, current episode mixed, unspecified: Secondary | ICD-10-CM | POA: Diagnosis not present

## 2023-04-28 NOTE — Telephone Encounter (Signed)
Called patient forms are ready for pick up. ?

## 2023-05-02 NOTE — Telephone Encounter (Signed)
Patient picked up form

## 2023-05-04 NOTE — Progress Notes (Deleted)
[address] [place] [address] [address] Dept: [phone]  FOLLOW UP NOTE  Patient ID: Katherine Mora, female    DOB: Oct 25, 1995  Age: 27 y.o. MRN: [id number] Date of Office Visit: 05/05/2023  Assessment  Chief Complaint: No chief complaint on file.  HPI Katherine Mora is a 27 year old female who presents to the clinic for follow-up visit.  She was last seen in this clinic on 02/01/2023 by Dr. 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  Her last environmental allergy testing was on 02/01/2023 and was positive to horse, grass pollen, ragweed pollen, weed pollen, tree pollen, indoor mold, dust mite, cat, and cockroach.  Lab testing to stinging insect was negative.  Discussed the use of AI scribe software for clinical note transcription with the patient, who gave verbal consent to proceed.  History of Present Illness             Drug Allergies:  Allergies  Allergen Reactions   Mometasone Furo-Formoterol Fum     Other reaction(s): Asthma and/or Shortness of Breath, Nausea and/or Vomiting   Montelukast Hives and Shortness Of Breath   Penicillins Anaphylaxis    Family History    Prednisone     Anxiety; rash    Tricyclic Antidepressants Other (See Comments)    Panic Attacks, Asthma Flare    Venlafaxine Hcl Other (See Comments)    Blurred vision, feeling faint   Clindamycin Rash    Physical Exam: There were no vitals taken for this visit.   Physical Exam  Diagnostics:    Assessment and Plan: No diagnosis found.  No orders of the defined types were placed in this encounter.   There are no Patient Instructions on file for this visit.  No follow-ups on file.    Thank you for the opportunity to care for this patient.  Please do not hesitate to contact me with questions.  Thermon Leyland, FNP [facility]

## 2023-05-05 ENCOUNTER — Ambulatory Visit: Payer: Medicaid Other | Admitting: Family Medicine

## 2023-05-30 ENCOUNTER — Ambulatory Visit: Payer: Medicaid Other

## 2023-05-30 ENCOUNTER — Encounter: Payer: Self-pay | Admitting: *Deleted

## 2023-05-30 ENCOUNTER — Other Ambulatory Visit: Payer: Self-pay | Admitting: Adult Health

## 2023-05-30 VITALS — BP 125/77 | HR 70 | Ht 63.0 in | Wt 198.5 lb

## 2023-05-30 DIAGNOSIS — Z3201 Encounter for pregnancy test, result positive: Secondary | ICD-10-CM | POA: Diagnosis not present

## 2023-05-30 LAB — POCT URINE PREGNANCY: Preg Test, Ur: POSITIVE — AB

## 2023-05-30 MED ORDER — ONDANSETRON HCL 4 MG PO TABS: 4.0000 mg | ORAL_TABLET | Freq: Three times a day (TID) | ORAL | 1 refills | Status: DC | PRN

## 2023-05-30 NOTE — Progress Notes (Signed)
Rx zofran

## 2023-05-30 NOTE — Progress Notes (Signed)
   NURSE VISIT- PREGNANCY CONFIRMATION   SUBJECTIVE:  Katherine Mora is a 27 y.o. G2P0010 female at [redacted]w[redacted]d by certain LMP of Patient's last menstrual period was 04/22/2023. Here for pregnancy confirmation.  Home pregnancy test: positive x 5.   She reports nausea.  She is taking prenatal vitamins.    OBJECTIVE:  BP 125/77 (BP Location: Left Arm, Patient Position: Sitting, Cuff Size: Normal)   Pulse 70   Ht 5\' 3"  (1.6 m)   Wt 198 lb 8 oz (90 kg)   LMP 04/22/2023   BMI 35.16 kg/m   Appears well, in no apparent distress  Results for orders placed or performed in visit on 05/30/23 (from the past 24 hour(s))  POCT urine pregnancy   Collection Time: 05/30/23  1:46 PM  Result Value Ref Range   Preg Test, Ur Positive (A) Negative    ASSESSMENT: Positive pregnancy test, [redacted]w[redacted]d by LMP    PLAN: Schedule for dating ultrasound: quant pending. Pt mentioned having + UPT's in the past and quant was negative.  Prenatal vitamins: continue   Nausea medicines: requested-note routed to [facility] to send prescription   OB packet given: Yes  Malachy Mood  05/30/2023 1:59 PM

## 2023-05-31 LAB — BETA HCG QUANT (REF LAB): hCG Quant: 1007 m[IU]/mL

## 2023-06-06 ENCOUNTER — Other Ambulatory Visit: Payer: Self-pay | Admitting: Internal Medicine

## 2023-06-06 ENCOUNTER — Other Ambulatory Visit: Payer: Self-pay | Admitting: Family Medicine

## 2023-06-06 DIAGNOSIS — Z8739 Personal history of other diseases of the musculoskeletal system and connective tissue: Secondary | ICD-10-CM

## 2023-06-07 ENCOUNTER — Other Ambulatory Visit: Payer: Self-pay | Admitting: Internal Medicine

## 2023-06-07 DIAGNOSIS — Z8739 Personal history of other diseases of the musculoskeletal system and connective tissue: Secondary | ICD-10-CM

## 2023-06-12 ENCOUNTER — Telehealth: Payer: Self-pay | Admitting: Internal Medicine

## 2023-06-12 ENCOUNTER — Other Ambulatory Visit: Payer: Self-pay | Admitting: Obstetrics & Gynecology

## 2023-06-12 DIAGNOSIS — O3680X0 Pregnancy with inconclusive fetal viability, not applicable or unspecified: Secondary | ICD-10-CM

## 2023-06-12 DIAGNOSIS — O09299 Supervision of pregnancy with other poor reproductive or obstetric history, unspecified trimester: Secondary | ICD-10-CM

## 2023-06-12 NOTE — Telephone Encounter (Signed)
That's fine

## 2023-06-12 NOTE — Telephone Encounter (Signed)
Patient asking to switch to Tenna Child prefers a female.

## 2023-06-14 ENCOUNTER — Ambulatory Visit (INDEPENDENT_AMBULATORY_CARE_PROVIDER_SITE_OTHER): Payer: Medicaid Other

## 2023-06-14 DIAGNOSIS — O09299 Supervision of pregnancy with other poor reproductive or obstetric history, unspecified trimester: Secondary | ICD-10-CM

## 2023-06-14 DIAGNOSIS — O3680X0 Pregnancy with inconclusive fetal viability, not applicable or unspecified: Secondary | ICD-10-CM

## 2023-06-14 DIAGNOSIS — O09291 Supervision of pregnancy with other poor reproductive or obstetric history, first trimester: Secondary | ICD-10-CM

## 2023-06-14 DIAGNOSIS — Z3A01 Less than 8 weeks gestation of pregnancy: Secondary | ICD-10-CM

## 2023-06-14 NOTE — Progress Notes (Signed)
[place] 6+3 wks,single IUP with yolk sac,FHR 117 bpm,normal ovaries,CRL 5.79 mm

## 2023-06-19 ENCOUNTER — Ambulatory Visit: Payer: Medicaid Other | Admitting: Internal Medicine

## 2023-06-27 NOTE — Progress Notes (Unsigned)
   Established Patient Office Visit   Subjective  Patient ID: Katherine Mora, female    DOB: 11-May-1996  Age: 27 y.o. MRN: [id number]  No chief complaint on file.   She  has a past medical history of Asthma, bipolar 1, Eczema, Fibromyalgia (2018), Heart palpitations, Hypoglycemia, Kidney stones, and Migraines.  HPI Patient presents to the clinic for   ROS    Objective:     LMP 04/22/2023  {Vitals History (Optional):23777}  Physical Exam   No results found for any visits on 06/29/23.  The ASCVD Risk score (Arnett DK, et al., 2019) failed to calculate for the following reasons:   The 2019 ASCVD risk score is only valid for ages 81 to 56    Assessment & Plan:  There are no diagnoses linked to this encounter.  No follow-ups on file.   Cruzita Lederer Newman Nip, FNP

## 2023-06-27 NOTE — Patient Instructions (Signed)
        Great to see you today.  I have refilled the medication(s) we provide.    Dermatology [facility] # [phone]   Sidney Ace  [phone]    If labs were collected, we will inform you of lab results once received either by echart message or telephone call.   - echart message- for normal results that have been seen by the patient already.   - telephone call: abnormal results or if patient has not viewed results in their echart.   - Please take medications as prescribed. - Follow up with your primary health provider if any health concerns arises. - If symptoms worsen please contact your primary care provider and/or visit the emergency department.

## 2023-06-29 ENCOUNTER — Encounter: Payer: Self-pay | Admitting: Family Medicine

## 2023-06-29 ENCOUNTER — Ambulatory Visit: Payer: Medicaid Other | Admitting: Family Medicine

## 2023-06-29 VITALS — BP 118/78 | HR 77 | Ht 64.0 in | Wt 201.0 lb

## 2023-06-29 DIAGNOSIS — L819 Disorder of pigmentation, unspecified: Secondary | ICD-10-CM

## 2023-06-29 DIAGNOSIS — R21 Rash and other nonspecific skin eruption: Secondary | ICD-10-CM | POA: Insufficient documentation

## 2023-06-29 NOTE — Assessment & Plan Note (Deleted)
Rash with diffuse hyperpigmentation,  Referral to dermatology for further evaluation  Rash has been present for more than 10 months

## 2023-06-29 NOTE — Assessment & Plan Note (Addendum)
Rash with diffuse hyperpigmentation,  Referral to dermatology for further evaluation potentially including a skin biopsy to determine the precise nature of the rash and guide appropriate treatment

## 2023-07-05 NOTE — L&D Delivery Note (Signed)
 Progress Note Katherine Mora is a 28 y.o. female [phone] with IUP at [redacted]w[redacted]d admitted for PROM.  She progressed with/ augmentation to complete and pushed ~ 10 minutes to deliver.  Cord clamping delayed by 1-3 minutes then clamped by CNM and cut by FOB since infant transitioning slowly.  Placenta intact and spontaneous, bleeding minimal.  Intact perineum, no repair. Mom and baby stable prior to transfer to postpartum. She plans on breastfeeding.   Delivery Note At 7:44 AM a viable and healthy female was delivered via Vaginal, Spontaneous (Presentation: Left Occiput Anterior).  APGAR: 8, 9; weight 7 lb 2.3 oz (3240 g).   Placenta status: Spontaneous, Intact.  Cord: 2 vessels with the following complications: None.   Anesthesia: Epidural Episiotomy: None Lacerations: None Suture Repair: n/a Est. Blood Loss (mL): 38  Mom to postpartum.  Baby to Couplet care / Skin to Skin.  Olam Boards 05/26/2024, 9:39 AM

## 2023-07-20 ENCOUNTER — Other Ambulatory Visit: Payer: Self-pay | Admitting: Obstetrics & Gynecology

## 2023-07-20 ENCOUNTER — Encounter: Payer: Self-pay | Admitting: Advanced Practice Midwife

## 2023-07-20 DIAGNOSIS — Z3490 Encounter for supervision of normal pregnancy, unspecified, unspecified trimester: Secondary | ICD-10-CM | POA: Insufficient documentation

## 2023-07-20 DIAGNOSIS — Z3682 Encounter for antenatal screening for nuchal translucency: Secondary | ICD-10-CM

## 2023-07-24 ENCOUNTER — Other Ambulatory Visit: Payer: Self-pay | Admitting: Obstetrics & Gynecology

## 2023-07-24 ENCOUNTER — Encounter: Payer: Self-pay | Admitting: Advanced Practice Midwife

## 2023-07-24 ENCOUNTER — Encounter: Payer: Medicaid Other | Admitting: *Deleted

## 2023-07-24 ENCOUNTER — Ambulatory Visit: Payer: Medicaid Other

## 2023-07-24 ENCOUNTER — Ambulatory Visit (INDEPENDENT_AMBULATORY_CARE_PROVIDER_SITE_OTHER): Payer: Medicaid Other | Admitting: Advanced Practice Midwife

## 2023-07-24 VITALS — BP 106/66 | HR 87 | Wt 200.0 lb

## 2023-07-24 DIAGNOSIS — Z3A01 Less than 8 weeks gestation of pregnancy: Secondary | ICD-10-CM

## 2023-07-24 DIAGNOSIS — O3680X0 Pregnancy with inconclusive fetal viability, not applicable or unspecified: Secondary | ICD-10-CM

## 2023-07-24 DIAGNOSIS — Z3682 Encounter for antenatal screening for nuchal translucency: Secondary | ICD-10-CM

## 2023-07-24 DIAGNOSIS — O021 Missed abortion: Secondary | ICD-10-CM

## 2023-07-24 DIAGNOSIS — Z3480 Encounter for supervision of other normal pregnancy, unspecified trimester: Secondary | ICD-10-CM

## 2023-07-24 MED ORDER — MISOPROSTOL 200 MCG PO TABS: 600.0000 ug | ORAL_TABLET | Freq: Once | ORAL | 2 refills | Status: DC

## 2023-07-24 NOTE — Progress Notes (Signed)
[facility] Clinic Visit  Patient name: Katherine Mora MRN [id number]  Date of birth: 04-10-1996  CC & HPI:  Katherine Mora is a 28 y.o. female presenting today for New OB appointment. Unfortunately, today's [place]:  7+3 wks single IUP,no FHT visualized,gestational sac 53.2 mm=11+1 wks,CRL 11.76 mm,normal ovaries,  Prior US on 06/14/23:    [place] 6+3 wks,single IUP with yolk sac,FHR 117 bpm,normal ovaries,CRL 5.79 mm      Pt denies bleeding/cramping.  Prior pregnancy hx includes a 14 week sized MAB (D&C) and "multiple chemical pregnancies".     Pertinent History Reviewed:  Medical & Surgical Hx:   Past Medical History:  Diagnosis Date   Asthma    bipolar 1    Bipolar   Eczema    Fibromyalgia 2018   Heart palpitations    Hypoglycemia    Kidney stones    Migraines    Past Surgical History:  Procedure Laterality Date   NASAL SEPTUM SURGERY Bilateral 2015   SINOSCOPY     Family History  Problem Relation Age of Onset   Heart attack Father    Diabetes Father    Eczema Brother    Allergic rhinitis Brother    Urticaria Brother    Cancer Paternal Aunt        brain   Sarcoidosis Maternal Grandmother    Kidney failure Maternal Grandmother    Cancer Maternal Grandfather        skin   Colon cancer Neg Hx    Colon polyps Neg Hx     Current Outpatient Medications:    OVER THE COUNTER MEDICATION, Magnessium oxide  483 mg one daily, Disp: , Rfl:    Prenatal Vit-Fe Fumarate-FA (PRENATAL VITAMIN PO), Take by mouth., Disp: , Rfl:    cetirizine (ZYRTEC ALLERGY) 10 MG tablet, Take 1-2 tablets by mouth daily (Patient not taking: Reported on 06/29/2023), Disp: 60 tablet, Rfl: 5   EPINEPHrine (EPIPEN 2-PAK) 0.3 mg/0.3 mL IJ SOAJ injection, Inject 0.3 mg into the muscle as needed for anaphylaxis., Disp: 2 each, Rfl: 2   gabapentin (NEURONTIN) 100 MG capsule, TAKE 1 CAPSULE (100 MG TOTAL) BY MOUTH IN THE MORNING (Patient not taking: Reported on 06/29/2023), Disp: 90 capsule, Rfl: 0    gabapentin (NEURONTIN) 300 MG capsule, Take 300 mg each afternoon and 600 mg nightly (Patient not taking: Reported on 06/29/2023), Disp: 180 capsule, Rfl: 0   Ginger, Zingiber officinalis, (GINGER PO), Take by mouth. (Patient not taking: Reported on 06/29/2023), Disp: , Rfl:    ondansetron (ZOFRAN) 4 MG tablet, Take 1 tablet (4 mg total) by mouth every 8 (eight) hours as needed., Disp: 20 tablet, Rfl: 1   Ubrogepant (UBRELVY) 100 MG TABS, Take 1 tablet (100 mg total) by mouth as needed. May repeat a dose in 2 hours if headache persist (Patient not taking: Reported on 06/29/2023), Disp: 16 tablet, Rfl: 6   VENTOLIN HFA 108 (90 Base) MCG/ACT inhaler, SMARTSIG:1-2 Puff(s) Via Inhaler Every 4-6 Hours PRN (Patient not taking: Reported on 06/29/2023), Disp: , Rfl:    zafirlukast (ACCOLATE) 10 MG tablet, TAKE 1 TABLET BY MOUTH TWICE A DAY., Disp: 60 tablet, Rfl: 0 Social History: Reviewed -  reports that she has quit smoking. Her smoking use included cigarettes. She has been exposed to tobacco smoke. She has never used smokeless tobacco.  Review of Systems:   Constitutional: Negative for fever and chills Eyes: Negative for visual disturbances Respiratory: Negative for shortness of breath, dyspnea Cardiovascular: Negative for chest pain  or palpitations  Gastrointestinal: Negative for vomiting, diarrhea and constipation; no abdominal pain Genitourinary: Negative for dysuria and urgency, vaginal irritation or itching Musculoskeletal: POSITIVE for myalgias  Neurological: Negative for dizziness and headaches    Objective Findings:    Physical Examination: Vitals:   07/24/23 1104  BP: 106/66  Pulse: 87   General appearance - well appearing, and in no distress Mental status - alert, oriented to person, place, and time Chest:  Normal respiratory effort Heart - normal rate and regular rhythm Abdomen:  Soft, nontender Pelvic: deferred Musculoskeletal:  Normal range of motion without  pain Extremities:  No edema    No results found for this or any previous visit (from the past 24 hours).    Assessment & Plan:  A:   Missed AB, 7.3 week size P:  Discussed options (wait/cytotec/D&C).  Pt plans cytotec later this week. Directions/warnings given orally and in writing.  MyChart message scheduled to go out on 1/27--will plan to check Qhcg (order in and released) 1 week after bleeding to ensure decline--no need to follow to zero d/t known IUP).    Meds ordered this encounter  Medications   misoprostol (CYTOTEC) 200 MCG tablet    Sig: Take 3 tablets (600 mcg total) by mouth once for 1 dose. May repeat in no (or very minimal) bleeding in 48 hours up to two more times    Dispense:  3 tablet    Refill:  2    Supervising Provider:   Duane Lope H [[id number]]     Pt unhappy that she requested a 7 week Korea and did not receive one, as well as first official New OB appointment being at 12 weeks.  Expressed sympathy for the situation and relayed desire to help her navigate through this difficult time without adding any more stressors.    No follow-ups on file.  Jacklyn Shell CNM 07/24/2023 11:45 AM

## 2023-07-24 NOTE — Progress Notes (Signed)
[place] 7+3 wks single IUP,no FHT visualized,gestational sac 53.2 mm=11+1 wks,CRL 11.76 mm,normal ovaries,Fran discussed results with patient

## 2023-07-24 NOTE — Patient Instructions (Addendum)
FACTS YOU SHOULD KNOW  WHAT IS AN EARLY PREGNANCY FAILURE? Once the egg is fertilized with the sperm and begins to develop, it attaches to the lining of the uterus. This early pregnancy tissue may not develop into an embryo (the beginning stage of a baby). Sometimes an embryo does develop but does not continue to grow. These problems can be seen on ultrasound.   MANAGEMNT OF EARLY PREGNANCY FAILURE: About 4 out of 100 (0.25%) women will have a pregnancy loss in her lifetime.  One in five pregnancies is found to be an early pregnancy failure.  There are 3 ways to care for an early pregnancy failure:    (1) Medicine, (2) Waiting for you to pass the pregnancy on your own. (3)Sometimes, surgery is necessary.   The decision as to how to proceed after being diagnosed with and early pregnancy failure is an individual one.  The decision can be made only after appropriate counseling.  You need to weigh the pros and cons of the choices. Then you can make the choice that works for you. Medicine (CYTOTEC) The complete procedure may take days to weeks, usually a few days No Surgery Bleeding may be heavy at times There may be drug side effects Patient has more control Waiting You may choose to wait, in which case your own body may complete the passing of the abnormal early pregnancy on its own in about 2-4 weeks Your bleeding may be heavy at times There is a small possibility that you may need surgery if the bleeding is too much or not all of the pregnancy has passed.  SURGERY (D&C) Procedure over in 1 day Requires being put to sleep Bleeding may be light Possible problems during surgery, including injury to womb(uterus) Care provider has more control  CYTOTEC MANAGEMENT Prostaglandins (cytotec) are the most widely used drug for this purpose. They cause the uterus to cramp and contract. You will either swallow or place the medicine yourself inside your vagina in the privacy of your home.  Empting of the uterus should occur within 3 days but the process may continue for several weeks. The bleeding may seem heavy at times. POSSIBLE SIDE EFFECTS FROM CYTOTEC Nausea   Vomiting Diarrhea Fever Chills  Hot Flashes Side effects  from the process of the early pregnancy failure include: Cramping  Bleeding Headaches  Dizziness RISKS: This is a low risk procedure. Less than 1 in 100 women has a complication. An incomplete passage of the early pregnancy may occur. Also, Hemorrhage (heavy bleeding) could happen.  Rarely the pregnancy will not be passed completely. Excessively heavy bleeding may occur.  Your doctor may need to perform surgery to empty the uterus (D&E). Afterwards: Everybody will feel differently after the early pregnancy completion. You may have soreness or cramps for a day or two. You may have soreness or cramps for day or two.  You may have light bleeding for up to 2 weeks. You may be as active as you feel like being. If you have any of the following problems you may call [facility] at [phone]. If you have pain that does not get better  with pain medication Bleeding that soaks through 2 thick full-sized sanitary pads in an hour Cramps that last longer than 2 days Foul smelling discharge Fever above 100.4 degrees F Even if you do not have any of these symptoms, you should have a follow-up exam to make sure you are healing properly. This appointment will be  made for you before you leave the hospital. Your next normal period will start again in 4-6 week after the loss. You can get pregnant soon after the loss, so use birth control right away. Finally: Make sure all your questions are answered before during and after any procedure. Follow up with medical care and family planning methods.    What to expect after the misoprostol  Cramping, moderate to heavy bleeding, and moderate pain are normal parts of the abortion process as your uterus passes the  pregnancy. Most women pass blood clots.  Cramping usually starts one to four hours after you place the misoprostol in your vagina. Bleeding usually starts between 30 minutes to four hours after you place the misoprostol in your vagina. However, it can take up to 24 hours for some women. Heavy bleeding and strong cramps usually last between one and four hours. Call or return to the hospital if you soak through more than two large maxi-pads per hour for three hours in a row.  For pain: Resting and using a heating pad or hot water bottle may help. Both Tramadol and ibuprofen usually help with the pain. You may use either one or both together. Call if your pain is not manageable with Tramadol and ibuprofen.  Tramadol:  You may take one or two tablets every four to six hours. You may take the first pill as soon as you feel you need something for the pain. It may make you feel sleepy, nauseated, or dizzy. Do not exceed this dose.  Ibuprofen (Motrin, Advil, etc.): You may take 800mg  of ibuprofen (four over-the-counter tablets) every six to eight hours. You may take the first dose of pills as soon as you feel you need something for the pain. Ibuprofen usually does not cause side effects such as sleepiness, nausea, or dizziness.  Other misoprostol side effects:  The following side effects are not dangerous and usually only last one to four hours. If any of these side effects make you very uncomfortable, you may treat your symptoms with over-the-counter medicines. Call if over-the-counter medicines do not relieve your symptoms.  Nausea or vomiting  Call if you vomit several times and cannot hold down liquids. Over the counter treatment: Benadryl 25mg  to 50mg  every six hours as needed, or Dramamine 25mg  to 50mg  every six hours as needed.  Diarrhea  Call if you have several episodes of diarrhea lasting more than four to five hours and you are having trouble drinking liquids (you may be getting  dehydrated). Over the counter treatment: Imodium 2 tablets (4mg ) once.  Fever, chills  Misoprostol may cause fever or chills in the first 24 hours. After 24 hours, fever or chills may be a sign of an infection and you should call the clinic. If you already took Tylenol, or ibuprofen for pain, do not take more of the same medication for fever or chills. Call the clinic if your fever is higher than 101 F (or 39 C). Over the counter treatment: Tylenol 500 to 650mg  every four hours, Ibuprofen 800mg  (four over-the-counter pills) every six to eight hours.   How to tell if the abortion is complete  Most women have bleeding and painful cramping. As you pass the pregnancy, the bleeding is usually heavy and the cramping very strong. This usually lasts one to four hours. Most women pass some blood clots in the toilet and the pregnancy is often one of those clots. After the pregnancy passes, the cramps decrease and the bleeding slows  down significantly.  Within a few hours after passing the pregnancy, cramps and bleeding should be much improved

## 2023-07-25 LAB — BETA HCG QUANT (REF LAB): hCG Quant: 16676 m[IU]/mL

## 2023-07-25 NOTE — Progress Notes (Unsigned)
Referring:  Billie Lade, MD [address] [place],  [place] [address]  PCP: Billie Lade, MD  Neurology was asked to evaluate Katherine Mora, a 28 year old female for a chief complaint of headaches.  Our recommendations of care will be communicated by shared medical record.    CC:  headaches  History provided from self  HPI:   Update 07/25/2023 JM:        Consult visit 02/14/2023 Dr. Delena Bali:  Medical co-morbidities: asthma, bipolar disorder, fibromyalgia, nephrolithiasis  The patient presents for evaluation of headaches which began when she was a child. Migraines have been worsening over the past 6 months. She has noticed increasingly blurry vision over the past year as well. Her migraines are either frontal or occipital. They are associated with photophobia, phonophobia, and nausea. She used to have a visual aura, but has not had one recently. She currently has 2-3 migraines per week. They can last 2-4 days at a time.  Headache History: Onset: childhood Aura: used to have visual aura, has not had one recently Associated Symptoms:  Photophobia: yes  Phonophobia: yes  Nausea: yes Other symptoms: yes Worse with activity?: yes Duration of headaches: 2-4 days  Migraine days per month: 14 Headache free days per month: 16  Current Treatment: Abortive none  Preventative none  Prior Therapies                                 Prevention: Topamax - lack of efficacy Elavil - lack of efficacy Effexor Verapamil - lack of efficacy Gabapentin 100/300/600 Prednisone - anxiety, rash  Rescue: Maxalt - made headaches worse Naratriptan - made headaches worse baclofen Tramadol Zofran  LABS: CBC    Component Value Date/Time   WBC 9.7 11/11/2022 1553   WBC 11.8 (H) 03/28/2022 0349   RBC 5.46 (H) 11/11/2022 1553   RBC 5.36 (H) 03/28/2022 0349   HGB 16.0 (H) 11/11/2022 1553   HCT 48.1 (H) 11/11/2022 1553   PLT 263 11/11/2022 1553   MCV 88 11/11/2022  1553   MCH 29.3 11/11/2022 1553   MCH 30.2 03/28/2022 0349   MCHC 33.3 11/11/2022 1553   MCHC 33.7 03/28/2022 0349   RDW 13.2 11/11/2022 1553   LYMPHSABS 2.2 11/11/2022 1553   EOSABS 0.1 11/11/2022 1553   BASOSABS 0.0 11/11/2022 1553      Latest Ref Rng & Units 11/11/2022    3:53 PM 03/28/2022    3:49 AM 02/15/2021   12:02 PM  CMP  Glucose 70 - 99 mg/dL 78  914  76   BUN 6 - 20 mg/dL 14  12  12    Creatinine 0.57 - 1.00 mg/dL 7.82  9.56  2.13   Sodium 134 - 144 mmol/L 143  141  139   Potassium 3.5 - 5.2 mmol/L 4.6  3.4  4.6   Chloride 96 - 106 mmol/L 103  108  102   CO2 20 - 29 mmol/L 24  24  24    Calcium 8.7 - 10.2 mg/dL 08.6  9.7  57.8   Total Protein 6.0 - 8.5 g/dL 7.0  7.9  7.3   Total Bilirubin 0.0 - 1.2 mg/dL 0.4  0.7  0.4   Alkaline Phos 44 - 121 IU/L 66  56  72   AST 0 - 40 IU/L 29  17  16    ALT 0 - 32 IU/L 20  17  13      IMAGING:  MRI brain 07/18/19: unremarkable   Current Outpatient Medications on File Prior to Visit  Medication Sig Dispense Refill   cetirizine (ZYRTEC ALLERGY) 10 MG tablet Take 1-2 tablets by mouth daily (Patient not taking: Reported on 06/29/2023) 60 tablet 5   EPINEPHrine (EPIPEN 2-PAK) 0.3 mg/0.3 mL IJ SOAJ injection Inject 0.3 mg into the muscle as needed for anaphylaxis. 2 each 2   gabapentin (NEURONTIN) 100 MG capsule TAKE 1 CAPSULE (100 MG TOTAL) BY MOUTH IN THE MORNING (Patient not taking: Reported on 06/29/2023) 90 capsule 0   gabapentin (NEURONTIN) 300 MG capsule Take 300 mg each afternoon and 600 mg nightly (Patient not taking: Reported on 06/29/2023) 180 capsule 0   Ginger, Zingiber officinalis, (GINGER PO) Take by mouth. (Patient not taking: Reported on 06/29/2023)     misoprostol (CYTOTEC) 200 MCG tablet Take 3 tablets (600 mcg total) by mouth once for 1 dose. May repeat in no (or very minimal) bleeding in 48 hours up to two more times 3 tablet 2   ondansetron (ZOFRAN) 4 MG tablet Take 1 tablet (4 mg total) by mouth every 8 (eight)  hours as needed. 20 tablet 1   OVER THE COUNTER MEDICATION Magnessium oxide  483 mg one daily     Prenatal Vit-Fe Fumarate-FA (PRENATAL VITAMIN PO) Take by mouth.     Ubrogepant (UBRELVY) 100 MG TABS Take 1 tablet (100 mg total) by mouth as needed. May repeat a dose in 2 hours if headache persist (Patient not taking: Reported on 06/29/2023) 16 tablet 6   VENTOLIN HFA 108 (90 Base) MCG/ACT inhaler SMARTSIG:1-2 Puff(s) Via Inhaler Every 4-6 Hours PRN (Patient not taking: Reported on 06/29/2023)     zafirlukast (ACCOLATE) 10 MG tablet TAKE 1 TABLET BY MOUTH TWICE A DAY. 60 tablet 0   No current facility-administered medications on file prior to visit.     Allergies: Allergies  Allergen Reactions   Mometasone Furo-Formoterol Fum     Other reaction(s): Asthma and/or Shortness of Breath, Nausea and/or Vomiting   Montelukast Hives and Shortness Of Breath   Penicillins Anaphylaxis    Family History    Prednisone     Anxiety; rash    Tricyclic Antidepressants Other (See Comments)    Panic Attacks, Asthma Flare    Venlafaxine Hcl Other (See Comments)    Blurred vision, feeling faint   Clindamycin Rash    Family History: Family History  Problem Relation Age of Onset   Heart attack Father    Diabetes Father    Eczema Brother    Allergic rhinitis Brother    Urticaria Brother    Cancer Paternal Aunt        brain   Sarcoidosis Maternal Grandmother    Kidney failure Maternal Grandmother    Cancer Maternal Grandfather        skin   Colon cancer Neg Hx    Colon polyps Neg Hx    Aunt had brain cancer  Past Medical History: Past Medical History:  Diagnosis Date   Asthma    bipolar 1    Bipolar   Eczema    Fibromyalgia 2018   Heart palpitations    Hypoglycemia    Kidney stones    Migraines     Past Surgical History Past Surgical History:  Procedure Laterality Date   d and c N/A 2017   14 week size   NASAL SEPTUM SURGERY Bilateral 2015   SINOSCOPY  Social  History: Social History   Tobacco Use   Smoking status: Former    Types: Cigarettes    Passive exposure: Current   Smokeless tobacco: Never  Vaping Use   Vaping status: Former  Substance Use Topics   Alcohol use: Not Currently   Drug use: Not Currently    ROS: Negative for fevers, chills. Positive for headaches. All other systems reviewed and negative unless stated otherwise in HPI.   Physical Exam:   Vital Signs: LMP 04/22/2023  GENERAL: well appearing,in no acute distress,alert SKIN:  Color, texture, turgor normal. No rashes or lesions HEAD:  Normocephalic/atraumatic. CV:  RRR RESP: Normal respiratory effort MSK: +tenderness to palpation over bilateral occiput, neck, and shoulders  NEUROLOGICAL: Mental Status: Alert, oriented to person, place and time,Follows commands Cranial Nerves: PERRL, visual fields intact to confrontation, extraocular movements intact, facial sensation intact, no facial droop or ptosis, hearing grossly intact, no dysarthria, palate elevate symmetrically, tongue protrudes midline, shoulder shrug intact and symmetric Motor: muscle strength 5/5 both upper and lower extremities,no drift, normal tone Reflexes: 2+ throughout Sensation: intact to light touch all 4 extremities Coordination: Finger-to- nose-finger intact bilaterally Gait: normal-based   IMPRESSION: 28 year old female with a history of asthma, bipolar disorder, fibromyalgia, nephrolithiasis who returns for follow-up of migraines.   Will order MRI brain as her headaches have been worsening over the past 6 months and have not responded to treatment. The patient is hesitant to start any new preventives at this time. Discussed supplement information for migraine prevention. Will prescribe Ubrelvy for rescue as she has failed 2 triptans previously.    PLAN: -MRI brain -Rescue: Start Ubrelvy 100 mg PRN -Supplement information provided -Window tint waiver form completed      I spent  *** minutes of face-to-face and non-face-to-face time with patient.  This included previsit chart review, lab review, study review, order entry, electronic health record documentation, patient education and discussion regarding above diagnoses and treatment plan and answered all other questions to patient's satisfaction  Ihor Austin, [facility]  [facility] [address] [place], [place] [address]  Phone [phone] Fax [phone] Note: This document was prepared with digital dictation and possible smart phrase technology. Any transcriptional errors that result from this process are unintentional.

## 2023-07-26 ENCOUNTER — Encounter: Payer: Self-pay | Admitting: Adult Health

## 2023-07-26 ENCOUNTER — Ambulatory Visit: Payer: Medicaid Other | Admitting: Adult Health

## 2023-07-26 VITALS — BP 117/72 | HR 82 | Ht 64.0 in | Wt 202.0 lb

## 2023-07-26 DIAGNOSIS — F3160 Bipolar disorder, current episode mixed, unspecified: Secondary | ICD-10-CM | POA: Diagnosis not present

## 2023-07-26 DIAGNOSIS — Z79899 Other long term (current) drug therapy: Secondary | ICD-10-CM | POA: Diagnosis not present

## 2023-07-26 DIAGNOSIS — Z5181 Encounter for therapeutic drug level monitoring: Secondary | ICD-10-CM | POA: Diagnosis not present

## 2023-07-26 DIAGNOSIS — G43719 Chronic migraine without aura, intractable, without status migrainosus: Secondary | ICD-10-CM

## 2023-07-26 DIAGNOSIS — R519 Headache, unspecified: Secondary | ICD-10-CM

## 2023-07-26 NOTE — Patient Instructions (Addendum)
Your Plan:  Use of Bernita Raisin as needed for severe migraines - please do not use if become pregnancy  Will follow up regarding MRI brain - will keep you updated   Continue magnesium for migraine management     Follow up in 6 months or call earlier if needed      Thank you for coming to see [place] at [facility]. I hope we have been able to provide you high quality care today.  You may receive a patient satisfaction survey over the next few weeks. We would appreciate your feedback and comments so that we may continue to improve ourselves and the health of our patients.

## 2023-07-26 NOTE — Telephone Encounter (Signed)
Patient seen today for follow-up visit.  She reports MRI brain was declined by insurance but from what I can tell, this was previously approved.  Can you please follow-up on this?  Please let me know if new orders are needed.  Thank you.

## 2023-07-27 NOTE — Telephone Encounter (Signed)
healthy blue Berkley Harvey: [phone] exp. 07/27/23-09/24/23 sent to GI [phone]

## 2023-07-28 NOTE — Patient Instructions (Signed)
Katherine Mora  07/28/2023     @PREFPERIOPPHARMACY @   Your procedure is scheduled on  08/01/2023.   Report to [facility] at  1155  A.M.   Call this number if you have problems the morning of surgery:  [phone]  If you experience any cold or flu symptoms such as cough, fever, chills, shortness of breath, etc. between now and your scheduled surgery, please notify us at the above number.   Remember:          Use your inhaler before you come and bring your rescue inhaler with you.   Do not eat after midnight.   You may drink clear liquids until 0955 am on 08/01/2023.    Clear liquids allowed are:                    Water, Juice (No red color; non-citric and without pulp; diabetics please choose diet or no sugar options), Carbonated beverages (diabetics please choose diet or no sugar options), Clear Tea (No creamer, milk, or cream, including half & half and powdered creamer), Black Coffee Only (No creamer, milk or cream, including half & half and powdered creamer), and Clear Sports drink (No red color; diabetics please choose diet or no sugar options)          At 0955 am on 08/01/2023 drink your carb drink. You can have nothing else to drink after this.    Take these medicines the morning of surgery with A SIP OF WATER                                           None.    Do not wear jewelry, make-up or nail polish, including gel polish,  artificial nails, or any other type of covering on natural nails (fingers and  toes).  Do not wear lotions, powders, or perfumes, or deodorant.  Do not shave 48 hours prior to surgery.  Men may shave face and neck.  Do not bring valuables to the hospital.  [facility] is not responsible for any belongings or valuables.  Contacts, dentures or bridgework may not be worn into surgery.  Leave your suitcase in the car.  After surgery it may be brought to your room.  For patients admitted to the hospital, discharge time will be determined  by your treatment team.  Patients discharged the day of surgery will not be allowed to drive home and must have someone with them for 24 hours.    Special instructions:   DO NOT smoke tobacco or vape for 24 hours before your procedure.  Please read over the following fact sheets that you were given. Anesthesia Post-op Instructions and Care and Recovery After Surgery      Dilation and Curettage or Vacuum Curettage, Care After The following information offers guidance on how to care for yourself after your procedure. Your doctor may also give you more specific instructions. If you have problems or questions, contact your doctor. What can I expect after the procedure? After the procedure, it is common to have: Mild pain or cramps. Some bleeding or spotting from the vagina. These may last for up to 2 weeks. Follow these instructions at home: Medicines Take over-the-counter and prescription medicines only as told by your doctor. If told, take steps to prevent problems with pooping (constipation). You may need to: Drink  enough fluid to keep your pee (urine) pale yellow. Take medicines. You will be told what medicines to take. Eat foods that are high in fiber. These include beans, whole grains, and fresh fruits and vegetables. Limit foods that are high in fat and sugar. These include fried or sweet foods. Ask your doctor if you should avoid driving or using machines while you are taking your medicine. Activity  If you were given a medicine to help you relax (sedative) during your procedure, it can affect you for many hours. Do not drive or use machinery until your doctor says that it is safe. Rest as told by your doctor. Get up to take short walks every 1-2 hours. Ask for help if you feel weak or unsteady. Do not lift anything that is heavier than 10 lb (4.5 kg), or the limit that you are told. Return to your normal activities when your doctor says that it is safe. Lifestyle For at least  2 weeks, or as long as told by your doctor: Do not douche. Do not use tampons. Do not have sex. General instructions Do not take baths, swim, or use a hot tub. Ask your doctor if you may take showers. Do not smoke or use any products that contain nicotine or tobacco. These can delay healing. If you need help quitting, ask your doctor. Wear compression stockings as told by your doctor. It is up to you to get the results of your procedure. Ask how to get your results when they are ready. Keep all follow-up visits. Contact a doctor if: You have very bad cramps that get worse or do not get better with medicine. You have very bad pain in your belly (abdomen). You cannot drink fluids without vomiting. You have pain in the area just above your thighs. You have fluid from your vagina that smells bad. You have a rash. Get help right away if: You are bleeding a lot from your vagina. This means soaking more than one sanitary pad in 1 hour, and this happens for 2 hours in a row. You have a fever that is above 100.58F (38C). Your belly feels very tender or hard. You have chest pain. You have trouble breathing. You feel dizzy or light-headed. You faint. You have pain in your neck or shoulder area. These symptoms may be an emergency. Get help right away. Call your local emergency services (911 in the [place] Do not wait to see if the symptoms will go away. Do not drive yourself to the hospital. Summary After your procedure, it is common to have pain or cramping. It is also common to have bleeding or spotting from your vagina. Rest as told. Get up to take short walks every 1-2 hours. Do not lift anything that is heavier than 10 lb (4.5 kg), or the limit that you are told. Get help right away if you have problems from the procedure. Ask your doctor what problems to watch for. This information is not intended to replace advice given to you by your health care provider. Make sure you discuss any  questions you have with your health care provider. Document Revised: 06/08/2020 Document Reviewed: 06/10/2020 Elsevier Patient Education  2024 [employer].General Anesthesia, Adult, Care After The following information offers guidance on how to care for yourself after your procedure. Your health care provider may also give you more specific instructions. If you have problems or questions, contact your health care provider. What can I expect after the procedure? After the procedure, it  is common for people to: Have pain or discomfort at the IV site. Have nausea or vomiting. Have a sore throat or hoarseness. Have trouble concentrating. Feel cold or chills. Feel weak, sleepy, or tired (fatigue). Have soreness and body aches. These can affect parts of the body that were not involved in surgery. Follow these instructions at home: For the time period you were told by your health care provider:  Rest. Do not participate in activities where you could fall or become injured. Do not drive or use machinery. Do not drink alcohol. Do not take sleeping pills or medicines that cause drowsiness. Do not make important decisions or sign legal documents. Do not take care of children on your own. General instructions Drink enough fluid to keep your urine pale yellow. If you have sleep apnea, surgery and certain medicines can increase your risk for breathing problems. Follow instructions from your health care provider about wearing your sleep device: Anytime you are sleeping, including during daytime naps. While taking prescription pain medicines, sleeping medicines, or medicines that make you drowsy. Return to your normal activities as told by your health care provider. Ask your health care provider what activities are safe for you. Take over-the-counter and prescription medicines only as told by your health care provider. Do not use any products that contain nicotine or tobacco. These products include  cigarettes, chewing tobacco, and vaping devices, such as e-cigarettes. These can delay incision healing after surgery. If you need help quitting, ask your health care provider. Contact a health care provider if: You have nausea or vomiting that does not get better with medicine. You vomit every time you eat or drink. You have pain that does not get better with medicine. You cannot urinate or have bloody urine. You develop a skin rash. You have a fever. Get help right away if: You have trouble breathing. You have chest pain. You vomit blood. These symptoms may be an emergency. Get help right away. Call 911. Do not wait to see if the symptoms will go away. Do not drive yourself to the hospital. Summary After the procedure, it is common to have a sore throat, hoarseness, nausea, vomiting, or to feel weak, sleepy, or fatigue. For the time period you were told by your health care provider, do not drive or use machinery. Get help right away if you have difficulty breathing, have chest pain, or vomit blood. These symptoms may be an emergency. This information is not intended to replace advice given to you by your health care provider. Make sure you discuss any questions you have with your health care provider. Document Revised: 09/17/2021 Document Reviewed: 09/17/2021 Elsevier Patient Education  2024 [employer].

## 2023-07-31 ENCOUNTER — Other Ambulatory Visit: Payer: Self-pay | Admitting: Obstetrics & Gynecology

## 2023-07-31 ENCOUNTER — Encounter (HOSPITAL_COMMUNITY)
Admission: RE | Admit: 2023-07-31 | Discharge: 2023-07-31 | Disposition: A | Discharge status: Home / Self Care | Payer: Medicaid Other | Source: Ambulatory Visit | Attending: Obstetrics & Gynecology | Admitting: Obstetrics & Gynecology

## 2023-07-31 ENCOUNTER — Encounter (HOSPITAL_COMMUNITY): Payer: Self-pay

## 2023-07-31 DIAGNOSIS — Z01818 Encounter for other preprocedural examination: Secondary | ICD-10-CM

## 2023-07-31 DIAGNOSIS — Z01812 Encounter for preprocedural laboratory examination: Secondary | ICD-10-CM | POA: Diagnosis not present

## 2023-07-31 DIAGNOSIS — O021 Missed abortion: Secondary | ICD-10-CM | POA: Diagnosis not present

## 2023-07-31 HISTORY — DX: Other specified postprocedural states: Z98.890

## 2023-07-31 HISTORY — DX: Anxiety disorder, unspecified: F41.9

## 2023-07-31 HISTORY — DX: Personal history of urinary calculi: Z87.442

## 2023-07-31 HISTORY — DX: Nausea with vomiting, unspecified: R11.2

## 2023-07-31 LAB — CBC
HCT: 43.7 % (ref 36.0–46.0)
Hemoglobin: 14.8 g/dL (ref 12.0–15.0)
MCH: 30 pg (ref 26.0–34.0)
MCHC: 33.9 g/dL (ref 30.0–36.0)
MCV: 88.6 fL (ref 80.0–100.0)
Platelets: 197 10*3/uL (ref 150–400)
RBC: 4.93 MIL/uL (ref 3.87–5.11)
RDW: 13.1 % (ref 11.5–15.5)
WBC: 4.7 10*3/uL (ref 4.0–10.5)
nRBC: 0 % (ref 0.0–0.2)

## 2023-07-31 LAB — TYPE AND SCREEN
ABO/RH(D): A POS
Antibody Screen: NEGATIVE

## 2023-08-01 ENCOUNTER — Ambulatory Visit (HOSPITAL_COMMUNITY): Payer: Medicaid Other | Admitting: Anesthesiology

## 2023-08-01 ENCOUNTER — Encounter (HOSPITAL_COMMUNITY)
Admission: RE | Disposition: A | Discharge status: Home / Self Care | Payer: Self-pay | Source: Home / Self Care | Attending: Obstetrics & Gynecology

## 2023-08-01 ENCOUNTER — Encounter (HOSPITAL_COMMUNITY): Payer: Self-pay | Admitting: Obstetrics & Gynecology

## 2023-08-01 ENCOUNTER — Ambulatory Visit (HOSPITAL_COMMUNITY)
Admission: RE | Admit: 2023-08-01 | Discharge: 2023-08-01 | Disposition: A | Discharge status: Home / Self Care | Payer: Medicaid Other | Source: Ambulatory Visit | Attending: Obstetrics & Gynecology | Admitting: Obstetrics & Gynecology

## 2023-08-01 ENCOUNTER — Ambulatory Visit (HOSPITAL_BASED_OUTPATIENT_CLINIC_OR_DEPARTMENT_OTHER): Payer: Medicaid Other | Admitting: Anesthesiology

## 2023-08-01 ENCOUNTER — Ambulatory Visit (HOSPITAL_COMMUNITY)
Admission: RE | Admit: 2023-08-01 | Discharge: 2023-08-01 | Disposition: A | Discharge status: Home / Self Care | Payer: Medicaid Other | Attending: Obstetrics & Gynecology | Admitting: Obstetrics & Gynecology

## 2023-08-01 DIAGNOSIS — I10 Essential (primary) hypertension: Secondary | ICD-10-CM | POA: Insufficient documentation

## 2023-08-01 DIAGNOSIS — Z3A01 Less than 8 weeks gestation of pregnancy: Secondary | ICD-10-CM

## 2023-08-01 DIAGNOSIS — O021 Missed abortion: Secondary | ICD-10-CM

## 2023-08-01 DIAGNOSIS — Z01818 Encounter for other preprocedural examination: Secondary | ICD-10-CM

## 2023-08-01 DIAGNOSIS — Z87891 Personal history of nicotine dependence: Secondary | ICD-10-CM | POA: Diagnosis not present

## 2023-08-01 DIAGNOSIS — Z3A12 12 weeks gestation of pregnancy: Secondary | ICD-10-CM

## 2023-08-01 HISTORY — PX: DILATION AND EVACUATION: SHX1459

## 2023-08-01 LAB — ABO/RH: ABO/RH(D): A POS

## 2023-08-01 SURGERY — DILATION AND EVACUATION, UTERUS
Anesthesia: General | Site: Uterus

## 2023-08-01 MED ORDER — METHYLERGONOVINE MALEATE 0.2 MG/ML IJ SOLN
INTRAMUSCULAR | Status: AC
Start: 2023-08-01 — End: ?
  Filled 2023-08-01: qty 1

## 2023-08-01 MED ORDER — LACTATED RINGERS IV SOLN: INTRAVENOUS | Status: DC | PRN

## 2023-08-01 MED ORDER — KETAMINE HCL 50 MG/5ML IJ SOSY
PREFILLED_SYRINGE | INTRAMUSCULAR | Status: AC
  Filled 2023-08-01: qty 5

## 2023-08-01 MED ORDER — PROPOFOL 500 MG/50ML IV EMUL
INTRAVENOUS | Status: AC
  Filled 2023-08-01: qty 50

## 2023-08-01 MED ORDER — METRONIDAZOLE 500 MG/100ML IV SOLN
500.0000 mg | INTRAVENOUS | Status: AC
  Administered 2023-08-01: 500 mg via INTRAVENOUS

## 2023-08-01 MED ORDER — LIDOCAINE HCL (PF) 2 % IJ SOLN
INTRAMUSCULAR | Status: AC
  Filled 2023-08-01: qty 5

## 2023-08-01 MED ORDER — METRONIDAZOLE 500 MG/100ML IV SOLN
INTRAVENOUS | Status: AC
  Filled 2023-08-01: qty 100

## 2023-08-01 MED ORDER — HYDROMORPHONE HCL 1 MG/ML IJ SOLN
INTRAMUSCULAR | Status: AC
  Filled 2023-08-01: qty 1

## 2023-08-01 MED ORDER — BUPIVACAINE HCL (PF) 0.25 % IJ SOLN
INTRAMUSCULAR | Status: AC
  Filled 2023-08-01: qty 30

## 2023-08-01 MED ORDER — ONDANSETRON 8 MG PO TBDP: 8.0000 mg | ORAL_TABLET | Freq: Three times a day (TID) | ORAL | 0 refills | Status: DC | PRN

## 2023-08-01 MED ORDER — MIDAZOLAM HCL 2 MG/2ML IJ SOLN
INTRAMUSCULAR | Status: AC
  Filled 2023-08-01: qty 2

## 2023-08-01 MED ORDER — CHLORHEXIDINE GLUCONATE 0.12 % MT SOLN
OROMUCOSAL | Status: AC
  Administered 2023-08-01: 15 mL via OROMUCOSAL
  Filled 2023-08-01: qty 15

## 2023-08-01 MED ORDER — DEXMEDETOMIDINE HCL IN NACL 80 MCG/20ML IV SOLN
INTRAVENOUS | Status: DC | PRN
  Administered 2023-08-01: 20 ug via INTRAVENOUS

## 2023-08-01 MED ORDER — LIDOCAINE HCL (CARDIAC) PF 100 MG/5ML IV SOSY
PREFILLED_SYRINGE | INTRAVENOUS | Status: DC | PRN
  Administered 2023-08-01: 100 mg via INTRAVENOUS

## 2023-08-01 MED ORDER — HYDROCODONE-ACETAMINOPHEN 5-325 MG PO TABS: 1.0000 | ORAL_TABLET | Freq: Four times a day (QID) | ORAL | 0 refills | Status: DC | PRN

## 2023-08-01 MED ORDER — ONDANSETRON HCL 4 MG/2ML IJ SOLN
INTRAMUSCULAR | Status: DC | PRN
  Administered 2023-08-01: 4 mg via INTRAVENOUS

## 2023-08-01 MED ORDER — PROPOFOL 10 MG/ML IV BOLUS
INTRAVENOUS | Status: AC
Start: 2023-08-01 — End: ?
  Filled 2023-08-01: qty 20

## 2023-08-01 MED ORDER — DEXMEDETOMIDINE HCL IN NACL 80 MCG/20ML IV SOLN
INTRAVENOUS | Status: AC
  Filled 2023-08-01: qty 20

## 2023-08-01 MED ORDER — CHLORHEXIDINE GLUCONATE 0.12 % MT SOLN: 15.0000 mL | Freq: Once | OROMUCOSAL | Status: AC

## 2023-08-01 MED ORDER — MIDAZOLAM HCL 2 MG/2ML IJ SOLN
INTRAMUSCULAR | Status: DC | PRN
  Administered 2023-08-01: 2 mg via INTRAVENOUS

## 2023-08-01 MED ORDER — PROPOFOL 500 MG/50ML IV EMUL
INTRAVENOUS | Status: DC | PRN
  Administered 2023-08-01: 150 ug/kg/min via INTRAVENOUS

## 2023-08-01 MED ORDER — KETOROLAC TROMETHAMINE 30 MG/ML IJ SOLN
INTRAMUSCULAR | Status: AC
  Administered 2023-08-01: 30 mg via INTRAVENOUS
  Filled 2023-08-01: qty 1

## 2023-08-01 MED ORDER — METHYLERGONOVINE MALEATE 0.2 MG/ML IJ SOLN
INTRAMUSCULAR | Status: DC | PRN
  Administered 2023-08-01: .2 mg via INTRAMUSCULAR

## 2023-08-01 MED ORDER — BUPIVACAINE HCL (PF) 0.25 % IJ SOLN
INTRAMUSCULAR | Status: DC | PRN
  Administered 2023-08-01: 20 mL

## 2023-08-01 MED ORDER — CIPROFLOXACIN IN D5W 400 MG/200ML IV SOLN
400.0000 mg | INTRAVENOUS | Status: AC
Start: 2023-08-01 — End: 2023-08-01
  Administered 2023-08-01: 400 mg via INTRAVENOUS

## 2023-08-01 MED ORDER — KETOROLAC TROMETHAMINE 30 MG/ML IJ SOLN: 30.0000 mg | Freq: Once | INTRAMUSCULAR | Status: AC

## 2023-08-01 MED ORDER — ONDANSETRON HCL 4 MG/2ML IJ SOLN
INTRAMUSCULAR | Status: AC
  Filled 2023-08-01: qty 2

## 2023-08-01 MED ORDER — KETOROLAC TROMETHAMINE 10 MG PO TABS: 10.0000 mg | ORAL_TABLET | Freq: Three times a day (TID) | ORAL | 0 refills | Status: DC | PRN

## 2023-08-01 MED ORDER — CIPROFLOXACIN IN D5W 400 MG/200ML IV SOLN
INTRAVENOUS | Status: AC
  Filled 2023-08-01: qty 200

## 2023-08-01 MED ORDER — POVIDONE-IODINE 10 % EX SWAB
2.0000 | Freq: Once | CUTANEOUS | Status: AC
  Administered 2023-08-01: 2 via TOPICAL

## 2023-08-01 MED ORDER — PROPOFOL 10 MG/ML IV BOLUS
INTRAVENOUS | Status: DC | PRN
  Administered 2023-08-01: 250 mg via INTRAVENOUS

## 2023-08-01 SURGICAL SUPPLY — 18 items
BAG HAMPER (MISCELLANEOUS) ×1 IMPLANT
CLOTH BEACON ORANGE TIMEOUT ST (SAFETY) ×1 IMPLANT
COVER LIGHT HANDLE STERIS (MISCELLANEOUS) ×2 IMPLANT
GLOVE BIOGEL PI IND STRL 7.0 (GLOVE) ×2 IMPLANT
GLOVE BIOGEL PI IND STRL 8 (GLOVE) ×1 IMPLANT
GLOVE ECLIPSE 8.0 STRL XLNG CF (GLOVE) ×2 IMPLANT
GOWN STRL REUS W/TWL LRG LVL3 (GOWN DISPOSABLE) ×1 IMPLANT
GOWN STRL REUS W/TWL XL LVL3 (GOWN DISPOSABLE) ×1 IMPLANT
KIT BERKELEY 1ST TRIMESTER 3/8 (MISCELLANEOUS) ×1 IMPLANT
KIT TURNOVER CYSTO (KITS) ×1 IMPLANT
MANIFOLD NEPTUNE II (INSTRUMENTS) ×1 IMPLANT
NS IRRIG 1000ML POUR BTL (IV SOLUTION) ×1 IMPLANT
PACK PERI GYN (CUSTOM PROCEDURE TRAY) ×1 IMPLANT
PAD ARMBOARD 7.5X6 YLW CONV (MISCELLANEOUS) ×1 IMPLANT
POSITIONER HEAD 8X9X4 ADT (SOFTGOODS) ×1 IMPLANT
SET BERKELEY SUCTION TUBING (SUCTIONS) ×1 IMPLANT
TOWEL OR 17X26 4PK STRL BLUE (TOWEL DISPOSABLE) ×1 IMPLANT
VACURETTE 8MM (CANNULA) IMPLANT

## 2023-08-01 NOTE — Transfer of Care (Signed)
Immediate Anesthesia Transfer of Care Note  Patient: Katherine Mora  Procedure(s) Performed: DILATATION AND EVACUATION (Uterus)  Patient Location: PACU  Anesthesia Type:General  Level of Consciousness: drowsy and patient cooperative  Airway & Oxygen Therapy: Patient Spontanous Breathing and Patient connected to face mask oxygen  Post-op Assessment: Report given to RN and Post -op Vital signs reviewed and stable  Post vital signs: Reviewed and stable  Last Vitals:  Vitals Value Taken Time  BP 118/74 08/01/23 1500  Temp 36.9 C 08/01/23 1454  Pulse 91 08/01/23 1503  Resp 17 08/01/23 1503  SpO2 100 % 08/01/23 1503  Vitals shown include unfiled device data.  Last Pain:  Vitals:   08/01/23 1454  PainSc: Asleep         Complications: No notable events documented.

## 2023-08-01 NOTE — Anesthesia Procedure Notes (Signed)
Procedure Name: LMA Insertion Date/Time: 08/01/2023 2:04 PM  Performed by: Oletha Cruel, CRNAPre-anesthesia Checklist: Patient identified, Emergency Drugs available, Suction available and Patient being monitored Patient Re-evaluated:Patient Re-evaluated prior to induction Oxygen Delivery Method: Circle system utilized Preoxygenation: Pre-oxygenation with 100% oxygen Induction Type: IV induction Ventilation: Mask ventilation without difficulty LMA: LMA inserted LMA Size: 4.0 Number of attempts: 1 Placement Confirmation: positive ETCO2, CO2 detector and breath sounds checked- equal and bilateral Tube secured with: Tape Dental Injury: Teeth and Oropharynx as per pre-operative assessment  Comments: Atraumatic insertion of LMA size 4. Lips and teeth remain in preoperative condition.

## 2023-08-01 NOTE — Anesthesia Preprocedure Evaluation (Signed)
Anesthesia Evaluation  Patient identified by MRN, date of birth, ID band Patient awake    Reviewed: Allergy & Precautions, H&P , NPO status , Patient's Chart, lab work & pertinent test results, reviewed documented beta blocker date and time   Airway Mallampati: II  TM Distance: >3 FB Neck ROM: full    Dental no notable dental hx.    Pulmonary neg pulmonary ROS, Patient abstained from smoking., former smoker   Pulmonary exam normal breath sounds clear to auscultation       Cardiovascular Exercise Tolerance: Good hypertension, negative cardio ROS  Rhythm:regular Rate:Normal     Neuro/Psych negative neurological ROS  negative psych ROS   GI/Hepatic negative GI ROS, Neg liver ROS,,,  Endo/Other  negative endocrine ROS    Renal/GU negative Renal ROS  negative genitourinary   Musculoskeletal   Abdominal   Peds  Hematology negative hematology ROS (+)   Anesthesia Other Findings   Reproductive/Obstetrics negative OB ROS                             Anesthesia Physical Anesthesia Plan  ASA: 2  Anesthesia Plan: General and General LMA   Post-op Pain Management:    Induction:   PONV Risk Score and Plan: Ondansetron  Airway Management Planned:   Additional Equipment:   Intra-op Plan:   Post-operative Plan:   Informed Consent: I have reviewed the patients History and Physical, chart, labs and discussed the procedure including the risks, benefits and alternatives for the proposed anesthesia with the patient or authorized representative who has indicated his/her understanding and acceptance.     Dental Advisory Given  Plan Discussed with: CRNA  Anesthesia Plan Comments:        Anesthesia Quick Evaluation

## 2023-08-01 NOTE — H&P (Signed)
Preoperative History and Physical  Katherine Mora is a 28 y.o. G2P0010 with Patient's last menstrual period was 04/22/2023. admitted for a D&C for 7week + pregnancy loss, 12 weeks + by early sonogram but current CRL [redacted]w[redacted]d no cardiac activity.    PMH:    Past Medical History:  Diagnosis Date   Anxiety    Asthma    bipolar 1    Bipolar   Eczema    Fibromyalgia 2018   Heart palpitations    History of kidney stones    Hypoglycemia    Migraines    PONV (postoperative nausea and vomiting)     PSH:     Past Surgical History:  Procedure Laterality Date   d and c N/A 2017   14 week size   NASAL SEPTUM SURGERY Bilateral 2015   SINOSCOPY      POb/GynH:      OB History     Gravida  2   Para      Term      Preterm      AB  1   Living         SAB  1   IAB      Ectopic      Multiple      Live Births              SH:   Social History   Tobacco Use   Smoking status: Former    Types: Cigarettes    Passive exposure: Current   Smokeless tobacco: Never  Vaping Use   Vaping status: Former  Substance Use Topics   Alcohol use: Not Currently   Drug use: Not Currently    FH:    Family History  Problem Relation Age of Onset   Heart attack Father    Diabetes Father    Eczema Brother    Allergic rhinitis Brother    Urticaria Brother    Cancer Paternal Aunt        brain   Sarcoidosis Maternal Grandmother    Kidney failure Maternal Grandmother    Cancer Maternal Grandfather        skin   Colon cancer Neg Hx    Colon polyps Neg Hx      Allergies:  Allergies  Allergen Reactions   Fentanyl Shortness Of Breath and Rash    lethargic   Mometasone Furo-Formoterol Fum Shortness Of Breath and Nausea And Vomiting    Asthma   Montelukast Hives and Shortness Of Breath   Penicillins Anaphylaxis    Family History    Tricyclic Antidepressants Other (See Comments)    Panic Attacks, Asthma Flare    Venlafaxine Hcl Other (See Comments)    Blurred  vision, feeling faint   Clindamycin Rash   Latex Rash   Prednisone Anxiety and Rash    Medications:       Current Facility-Administered Medications:    ciprofloxacin (CIPRO) 400 MG/200ML IVPB, , , ,    metroNIDAZOLE (FLAGYL) IVPB 500 mg, 500 mg, Intravenous, On Call to OR **AND** ciprofloxacin (CIPRO) IVPB 400 mg, 400 mg, Intravenous, On Call to OR, Lazaro Arms, MD   ketorolac (TORADOL) 30 MG/ML injection 30 mg, 30 mg, Intravenous, Once, Lazaro Arms, MD   ketorolac (TORADOL) 30 MG/ML injection, , , ,    metroNIDAZOLE (FLAGYL) 500 MG/100ML IVPB, , , ,   Review of Systems:   Review of Systems  Constitutional: Negative for fever, chills, weight loss, malaise/fatigue and diaphoresis.  HENT: Negative for hearing loss, ear pain, nosebleeds, congestion, sore throat, neck pain, tinnitus and ear discharge.   Eyes: Negative for blurred vision, double vision, photophobia, pain, discharge and redness.  Respiratory: Negative for cough, hemoptysis, sputum production, shortness of breath, wheezing and stridor.   Cardiovascular: Negative for chest pain, palpitations, orthopnea, claudication, leg swelling and PND.  Gastrointestinal: Positive for abdominal pain. Negative for heartburn, nausea, vomiting, diarrhea, constipation, blood in stool and melena.  Genitourinary: Negative for dysuria, urgency, frequency, hematuria and flank pain.  Musculoskeletal: Negative for myalgias, back pain, joint pain and falls.  Skin: Negative for itching and rash.  Neurological: Negative for dizziness, tingling, tremors, sensory change, speech change, focal weakness, seizures, loss of consciousness, weakness and headaches.  Endo/Heme/Allergies: Negative for environmental allergies and polydipsia. Does not bruise/bleed easily.  Psychiatric/Behavioral: Negative for depression, suicidal ideas, hallucinations, memory loss and substance abuse. The patient is not nervous/anxious and does not have insomnia.      PHYSICAL  EXAM:  Blood pressure 122/74, pulse 86, temperature 99.1 F (37.3 C), resp. rate 16, height 5\' 4"  (1.626 m), weight 91.6 kg, last menstrual period 04/22/2023, SpO2 97%.    Vitals reviewed. Constitutional: She is oriented to person, place, and time. She appears well-developed and well-nourished.  HENT:  Head: Normocephalic and atraumatic.  Right Ear: External ear normal.  Left Ear: External ear normal.  Nose: Nose normal.  Mouth/Throat: Oropharynx is clear and moist.  Eyes: Conjunctivae and EOM are normal. Pupils are equal, round, and reactive to light. Right eye exhibits no discharge. Left eye exhibits no discharge. No scleral icterus.  Neck: Normal range of motion. Neck supple. No tracheal deviation present. No thyromegaly present.  Cardiovascular: Normal rate, regular rhythm, normal heart sounds and intact distal pulses.  Exam reveals no gallop and no friction rub.   No murmur heard. Respiratory: Effort normal and breath sounds normal. No respiratory distress. She has no wheezes. She has no rales. She exhibits no tenderness.  GI: Soft. Bowel sounds are normal. She exhibits no distension and no mass. There is tenderness. There is no rebound and no guarding.  Genitourinary:       Vulva is normal without lesions Vagina is pink moist without discharge Cervix normal in appearance and pap is normal Uterus is normal size, contour, position, consistency, mobility, non-tender Adnexa is negative with normal sized ovaries by sonogram  Musculoskeletal: Normal range of motion. She exhibits no edema and no tenderness.  Neurological: She is alert and oriented to person, place, and time. She has normal reflexes. She displays normal reflexes. No cranial nerve deficit. She exhibits normal muscle tone. Coordination normal.  Skin: Skin is warm and dry. No rash noted. No erythema. No pallor.  Psychiatric: She has a normal mood and affect. Her behavior is normal. Judgment and thought content normal.     Labs: Results for orders placed or performed during the hospital encounter of 07/31/23 (from the past 2 weeks)  CBC   Collection Time: 07/31/23  2:02 PM  Result Value Ref Range   WBC 4.7 4.0 - 10.5 K/uL   RBC 4.93 3.87 - 5.11 MIL/uL   Hemoglobin 14.8 12.0 - 15.0 g/dL   HCT 08.6 57.8 - 46.9 %   MCV 88.6 80.0 - 100.0 fL   MCH 30.0 26.0 - 34.0 pg   MCHC 33.9 30.0 - 36.0 g/dL   RDW 62.9 52.8 - 41.3 %   Platelets 197 150 - 400 K/uL   nRBC 0.0 0.0 - 0.2 %  Type and screen   Collection Time: 07/31/23  2:02 PM  Result Value Ref Range   ABO/RH(D) A POS    Antibody Screen NEG    Sample Expiration      08/03/2023,2359 Performed at [facility], [address], [place], [place] [address]   Results for orders placed or performed in visit on 07/24/23 (from the past 2 weeks)  Beta hCG quant (ref lab)   Collection Time: 07/24/23 11:50 AM  Result Value Ref Range   hCG Quant 16,676 mIU/mL    EKG: Orders placed or performed during the hospital encounter of 10/05/20   ED EKG   ED EKG   EKG 12-Lead   EKG 12-Lead   EKG    Imaging Studies: US OB Comp Less 14 Wks Result Date: 07/28/2023 Table formatting from the original result was not included. Images from the original result were not included.  ..an Financial trader of Ultrasound Medicine Technical sales engineer) accredited practice [facility] [address] [place] [address] Ordering Provider: Myna Hidalgo, DO FOLLOW UP SONOGRAM Nami Strawder is in the office for a follow up sonogram for viability. She is a 28 y.o. year old G2P0010 with Estimated Date of Delivery: 02/04/24 by early ultrasound now at  [redacted]w[redacted]d weeks gestation. Thus far the pregnancy has been uncomplicated.Patient was here for her new OB. GESTATION: SINGLETON FETAL ACTIVITY:          Heart rate         no fetal heart tones        CERVIX: Appears closed ADNEXA: The ovaries are normal. GESTATIONAL AGE AND  BIOMETRICS: Gestational criteria: Estimated  Date of Delivery: 02/04/24 by early ultrasound now at [redacted]w[redacted]d Previous Scans:1 GESTATIONAL SAC           53.2 mm         11+1 weeks CROWN RUMP LENGTH           11.76 mm         7+3 weeks                                                                      AVERAGE EGA(BY THIS SCAN):  7+3 weeks                                          SUSPECTED ABNORMALITIES:  yes QUALITY OF SCAN: satisfactory TECHNICIAN COMMENTS: [place] 7+3 wks single IUP,no FHT visualized,gestational sac 53.2 mm=11+1 wks,CRL 11.76 mm,normal ovaries,Fran discussed results with patient A copy of this report including all images has been saved and backed up to a second source for retrieval if needed. All measures and details of the anatomical scan, placentation, fluid volume and pelvic anatomy are contained in that report. Amber Flora Lipps 07/24/2023 11:22 AM Clinical Impression and recommendations: Comparison study 06/14/2023 I have reviewed the sonogram results above, combined with the patient's current clinical course, below are my impressions and any appropriate recommendations for management based on the sonographic findings. Non viable intrauterine pregnancy, CRL [redacted]w[redacted]d G2P0010 Estimated Date of Delivery: 02/04/24 Normal general sonographic findings This recommendation follows SRU consensus guidelines: Diagnostic Criteria for  Nonviable Pregnancy Early in the First Trimester. Malva Limes Med 2013; [id number]. Lazaro Arms 07/28/2023 7:24 AM       Assessment: Missed AB in the first trimester A pos  ANORA testing   Plan:  Suction and sharp uterine curettage   Lazaro Arms 08/01/2023 1:22 PM

## 2023-08-01 NOTE — Op Note (Signed)
Preoperative diagnosis:   Missed AB in the first trimester  Postoperative diagnosis:  Same as above  Procedure:  Cervical dilation with suction and sharp uterine curettage  Surgeon:  Lazaro Arms  Anesthesia:  Laryngeal mask airway  Findings:  The patient was known to have a first trimester pregnancy loss by serial sonogram evaluations from the office and also a confirmation sonogram performed today at [facility] pre op at the request of the patient.    Description of operation:  The patient was taken to the operating room and placed in the supine position.  She underwent laryngeal mask airway general anesthesia.  The patient was placed in the dorsal lithotomy position.  The vagina was prepped and draped in the usual sterile fashion.  A Graves speculum was placed.  The anterior cervix was grasped with a single-tooth tenaculum.  The cervix was dilated serially with Hegar dilators.  A #8 curved suction curette was placed in the uterus.  The suction pressure was placed at 55 and 3  passes were made.  All of the intrauterine contents were removed.  The sharp curette was used x1 to feel uterine crie in all areas.  The patient was given Methergine 0.2 mg IM x1.  There was good hemostasis.  The patient was given flagyl 500 and ciprofloxacin 400 mg IV preoperatively.  The patient was given Toradol 30 mg IV preoperatively.  Estimated blood loss for the procedure was 100 cc.  The patient was awakened from anesthesia taken to the recovery room in good stable condition.  All counts were correct x3.  Lazaro Arms, MD 08/01/2023 2:39 PM

## 2023-08-02 ENCOUNTER — Encounter (HOSPITAL_COMMUNITY): Payer: Self-pay | Admitting: Obstetrics & Gynecology

## 2023-08-03 ENCOUNTER — Encounter: Payer: Self-pay | Admitting: Psychiatry

## 2023-08-03 DIAGNOSIS — R519 Headache, unspecified: Secondary | ICD-10-CM

## 2023-08-03 LAB — SURGICAL PATHOLOGY

## 2023-08-06 NOTE — Anesthesia Postprocedure Evaluation (Signed)
Anesthesia Post Note  Patient: Database administrator  Procedure(s) Performed: DILATATION AND EVACUATION (Uterus)  Patient location during evaluation: Phase II Anesthesia Type: General Level of consciousness: awake Pain management: pain level controlled Vital Signs Assessment: post-procedure vital signs reviewed and stable Respiratory status: spontaneous breathing and respiratory function stable Cardiovascular status: blood pressure returned to baseline and stable Postop Assessment: no headache and no apparent nausea or vomiting Anesthetic complications: no Comments: Late entry   No notable events documented.   Last Vitals:  Vitals:   08/01/23 1530 08/01/23 1549  BP: (!) 95/59 (!) 103/56  Pulse: 77 80  Resp: (!) 21 18  Temp:  37.2 C  SpO2: 100% 100%    Last Pain:  Vitals:   08/02/23 1143  TempSrc:   PainSc: 1                  Windell Norfolk

## 2023-08-08 ENCOUNTER — Encounter (HOSPITAL_COMMUNITY): Payer: Self-pay

## 2023-08-08 ENCOUNTER — Other Ambulatory Visit: Payer: Self-pay | Admitting: Family Medicine

## 2023-08-08 ENCOUNTER — Telehealth: Payer: Self-pay | Admitting: Obstetrics & Gynecology

## 2023-08-08 ENCOUNTER — Ambulatory Visit: Payer: Self-pay | Admitting: Family Medicine

## 2023-08-08 ENCOUNTER — Other Ambulatory Visit: Payer: Self-pay

## 2023-08-08 ENCOUNTER — Emergency Department (HOSPITAL_COMMUNITY): Payer: Medicaid Other

## 2023-08-08 ENCOUNTER — Emergency Department (HOSPITAL_COMMUNITY)
Admission: EM | Admit: 2023-08-08 | Discharge: 2023-08-08 | Disposition: A | Discharge status: Home / Self Care | Payer: Medicaid Other | Attending: Emergency Medicine | Admitting: Emergency Medicine

## 2023-08-08 DIAGNOSIS — R102 Pelvic and perineal pain: Secondary | ICD-10-CM | POA: Diagnosis not present

## 2023-08-08 DIAGNOSIS — N898 Other specified noninflammatory disorders of vagina: Secondary | ICD-10-CM

## 2023-08-08 DIAGNOSIS — R1030 Lower abdominal pain, unspecified: Secondary | ICD-10-CM | POA: Insufficient documentation

## 2023-08-08 DIAGNOSIS — N939 Abnormal uterine and vaginal bleeding, unspecified: Secondary | ICD-10-CM | POA: Diagnosis not present

## 2023-08-08 DIAGNOSIS — Z91040 Latex allergy status: Secondary | ICD-10-CM | POA: Diagnosis not present

## 2023-08-08 LAB — COMPREHENSIVE METABOLIC PANEL
ALT: 14 U/L (ref 0–44)
AST: 16 U/L (ref 15–41)
Albumin: 4.1 g/dL (ref 3.5–5.0)
Alkaline Phosphatase: 46 U/L (ref 38–126)
Anion gap: 10 (ref 5–15)
BUN: 13 mg/dL (ref 6–20)
CO2: 24 mmol/L (ref 22–32)
Calcium: 9.5 mg/dL (ref 8.9–10.3)
Chloride: 105 mmol/L (ref 98–111)
Creatinine, Ser: 0.65 mg/dL (ref 0.44–1.00)
GFR, Estimated: >60 mL/min (ref >60)
Glucose, Bld: 82 mg/dL (ref 70–99)
Potassium: 4.4 mmol/L (ref 3.5–5.1)
Sodium: 139 mmol/L (ref 135–145)
Total Bilirubin: 0.7 mg/dL (ref 0.0–1.2)
Total Protein: 7.4 g/dL (ref 6.5–8.1)

## 2023-08-08 LAB — CBC WITH DIFFERENTIAL/PLATELET
Abs Immature Granulocytes: 0.01 10*3/uL (ref 0.00–0.07)
Basophils Absolute: 0 10*3/uL (ref 0.0–0.1)
Basophils Relative: 0 %
Eosinophils Absolute: 0.1 10*3/uL (ref 0.0–0.5)
Eosinophils Relative: 1 %
HCT: 45.7 % (ref 36.0–46.0)
Hemoglobin: 15.5 g/dL — ABNORMAL HIGH (ref 12.0–15.0)
Immature Granulocytes: 0 %
Lymphocytes Relative: 27 %
Lymphs Abs: 2 10*3/uL (ref 0.7–4.0)
MCH: 29.9 pg (ref 26.0–34.0)
MCHC: 33.9 g/dL (ref 30.0–36.0)
MCV: 88.2 fL (ref 80.0–100.0)
Monocytes Absolute: 0.4 10*3/uL (ref 0.1–1.0)
Monocytes Relative: 5 %
Neutro Abs: 5.1 10*3/uL (ref 1.7–7.7)
Neutrophils Relative %: 67 %
Platelets: 278 10*3/uL (ref 150–400)
RBC: 5.18 MIL/uL — ABNORMAL HIGH (ref 3.87–5.11)
RDW: 12.6 % (ref 11.5–15.5)
WBC: 7.6 10*3/uL (ref 4.0–10.5)
nRBC: 0 % (ref 0.0–0.2)

## 2023-08-08 LAB — URINALYSIS, ROUTINE W REFLEX MICROSCOPIC
Bilirubin Urine: NEGATIVE
Glucose, UA: NEGATIVE mg/dL
Hgb urine dipstick: NEGATIVE
Ketones, ur: NEGATIVE mg/dL
Leukocytes,Ua: NEGATIVE
Nitrite: NEGATIVE
Protein, ur: NEGATIVE mg/dL
Specific Gravity, Urine: 1.021 (ref 1.005–1.030)
pH: 5 (ref 5.0–8.0)

## 2023-08-08 LAB — WET PREP, GENITAL
Clue Cells Wet Prep HPF POC: NONE SEEN
Sperm: NONE SEEN
Trich, Wet Prep: NONE SEEN
WBC, Wet Prep HPF POC: <10 (ref <10)
Yeast Wet Prep HPF POC: NONE SEEN

## 2023-08-08 MED ORDER — DICLOFENAC SODIUM 75 MG PO TBEC: 75.0000 mg | DELAYED_RELEASE_TABLET | Freq: Two times a day (BID) | ORAL | 0 refills | Status: DC

## 2023-08-08 NOTE — ED Provider Notes (Signed)
 [place] EMERGENCY DEPARTMENT AT [facility] Provider Note   CSN: [id number] Arrival date & time: 08/08/23  1445     History  Chief Complaint  Patient presents with   Vaginal Bleeding    Katherine Mora is a 28 y.o. female.  Patient is a 28 year old female who presents to the Emergency Department with a chief complaint of suprapubic abdominal pain, vaginal bleeding and discharge.  Patient notes that she did have a D&C performed by Dr. Jayne on 08/01/2023.  She does note that over the past 24 hours she has developed some increased pain as well as a foul-smelling discharge and bleeding.  Patient notes that she did attempt to reach out to her gynecologist but has been unable to get a hold of them.  Patient notes that she has had no associated nausea, vomiting, diarrhea.  She denies any associated fever or chills.  She has had no associated dysuria or hematuria.  She has not been sexually active since the procedure.  She denies any recent falls or blunt abdominal wall or pelvic trauma.   Vaginal Bleeding      Home Medications Prior to Admission medications   Medication Sig Start Date End Date Taking? Authorizing Provider  acetaminophen  (TYLENOL ) 500 MG tablet Take 1,000 mg by mouth every 6 (six) hours as needed for moderate pain (pain score 4-6).    [provider]  Baclofen 5 MG TABS Take 20 mg by mouth at bedtime.    [provider]  cetirizine  (ZYRTEC  ALLERGY ) 10 MG tablet Take 1-2 tablets by mouth daily 02/01/23   Iva Marty Saltness, MD  EPINEPHrine  (EPIPEN  2-PAK) 0.3 mg/0.3 mL IJ SOAJ injection Inject 0.3 mg into the muscle as needed for anaphylaxis. 02/01/23   Iva Marty Saltness, MD  gabapentin  (NEURONTIN ) 100 MG capsule TAKE 1 CAPSULE (100 MG TOTAL) BY MOUTH IN THE MORNING 06/07/23   Melvenia Manus BRAVO, MD  gabapentin  (NEURONTIN ) 300 MG capsule Take 300 mg each afternoon and 600 mg nightly 03/17/23   Zarwolo, Gloria, FNP  HYDROcodone -acetaminophen   (NORCO/VICODIN) 5-325 MG tablet Take 1 tablet by mouth every 6 (six) hours as needed. 08/01/23   Jayne Vonn DEL, MD  ketorolac  (TORADOL ) 10 MG tablet Take 1 tablet (10 mg total) by mouth every 8 (eight) hours as needed. 08/01/23   Jayne Vonn DEL, MD  MAGNESIUM PO Take 483 mg by mouth daily.    [provider]  ondansetron  (ZOFRAN ) 4 MG tablet Take 1 tablet (4 mg total) by mouth every 8 (eight) hours as needed. 05/30/23   Signa Delon LABOR, NP  ondansetron  (ZOFRAN -ODT) 8 MG disintegrating tablet Take 1 tablet (8 mg total) by mouth every 8 (eight) hours as needed for nausea or vomiting. 08/01/23   Jayne Vonn DEL, MD  Prenatal Vit-Fe Fumarate-FA (PRENATAL VITAMIN PO) Take 1 tablet by mouth daily.    [provider]  Ubrogepant  (UBRELVY ) 100 MG TABS Take 1 tablet (100 mg total) by mouth as needed. May repeat a dose in 2 hours if headache persist 02/14/23   Rush Delon, MD  VENTOLIN  HFA 108 (90 Base) MCG/ACT inhaler Inhale 2 puffs into the lungs every 4 (four) hours as needed for wheezing or shortness of breath. 12/23/22   [provider]  zafirlukast  (ACCOLATE ) 10 MG tablet TAKE 1 TABLET BY MOUTH TWICE A DAY. 04/19/21   Iva Marty Saltness, MD      Allergies    Fentanyl , Mometasone furo-formoterol fum, Montelukast, Penicillins, Tricyclic antidepressants, Venlafaxine hcl, Clindamycin,  Latex, and Prednisone    Review of Systems   Review of Systems  Genitourinary:  Positive for vaginal bleeding.  All other systems reviewed and are negative.   Physical Exam Updated Vital Signs BP 123/81 (BP Location: Right Arm)   Pulse 85   Temp 99.1 F (37.3 C) (Oral)   Resp 18   Ht 5' 4 (1.626 m)   Wt 91.6 kg   LMP 04/22/2023   SpO2 99%   Breastfeeding Unknown   BMI 34.66 kg/m  Physical Exam Vitals reviewed.  Constitutional:      Appearance: Normal appearance.  HENT:     Head: Normocephalic and atraumatic.     Nose: Nose normal.     Mouth/Throat:     Mouth: Mucous  membranes are moist.  Eyes:     Extraocular Movements: Extraocular movements intact.     Conjunctiva/sclera: Conjunctivae normal.     Pupils: Pupils are equal, round, and reactive to light.  Cardiovascular:     Rate and Rhythm: Normal rate and regular rhythm.     Pulses: Normal pulses.     Heart sounds: Normal heart sounds.  Pulmonary:     Effort: Pulmonary effort is normal.     Breath sounds: Normal breath sounds.  Abdominal:     General: Abdomen is flat. Bowel sounds are normal. There is no distension.     Palpations: Abdomen is soft.     Tenderness: There is no guarding.     Comments: Tenderness palpation over suprapubic region  Musculoskeletal:        General: Normal range of motion.     Cervical back: Normal range of motion and neck supple.  Skin:    General: Skin is warm and dry.  Neurological:     General: No focal deficit present.     Mental Status: She is alert and oriented to person, place, and time. Mental status is at baseline.  Psychiatric:        Mood and Affect: Mood normal.        Behavior: Behavior normal.        Thought Content: Thought content normal.        Judgment: Judgment normal.     ED Results / Procedures / Treatments   Labs (all labs ordered are listed, but only abnormal results are displayed) Labs Reviewed  CBC WITH DIFFERENTIAL/PLATELET - Abnormal; Notable for the following components:      Result Value   RBC 5.18 (*)    Hemoglobin 15.5 (*)    All other components within normal limits  WET PREP, GENITAL  URINALYSIS, ROUTINE W REFLEX MICROSCOPIC  COMPREHENSIVE METABOLIC PANEL  GC/CHLAMYDIA PROBE AMP ([place]) NOT AT [facility]    EKG None  Radiology US  Pelvis Complete Result Date: 08/08/2023 CLINICAL DATA:  Pelvic pain, vaginal spotting and foul odor. Status post D and C on 08/01/2023 EXAM: TRANSABDOMINAL AND TRANSVAGINAL ULTRASOUND OF PELVIS DOPPLER ULTRASOUND OF OVARIES TECHNIQUE: Both transabdominal and transvaginal ultrasound  examinations of the pelvis were performed. Transabdominal technique was performed for global imaging of the pelvis including uterus, ovaries, adnexal regions, and pelvic cul-de-sac. It was necessary to proceed with endovaginal exam following the transabdominal exam to visualize the ovaries and better visualize the uterus and endometrium. Color and duplex Doppler ultrasound was utilized to evaluate blood flow to the ovaries. COMPARISON:  Ob ultrasound dated 08/01/2023 FINDINGS: Uterus Measurements: 9.8 x 5.9 x 4.4 cm = volume: 132 mL. No fibroids or other mass visualized. Endometrium Thickness: 10.3  mm. No focal abnormality visualized. No abnormal vascularity. Right ovary Measurements: 4.6 x 2.2 x 1.8 cm = volume: 9.5 mL. Normal appearance/no adnexal mass. Left ovary Measurements: 3.7 x 2.2 x 1.7 cm = volume: 7.2 mL. Normal appearance/no adnexal mass. Pulsed Doppler evaluation of both ovaries demonstrates normal low-resistance arterial and venous waveforms. Other findings No abnormal free fluid. IMPRESSION: Normal examination.  No evidence of retained products of conception. Electronically Signed   By: Elspeth Bathe M.D.   On: 08/08/2023 17:26   US  Transvaginal Non-OB Result Date: 08/08/2023 CLINICAL DATA:  Pelvic pain, vaginal spotting and foul odor. Status post D and C on 08/01/2023 EXAM: TRANSABDOMINAL AND TRANSVAGINAL ULTRASOUND OF PELVIS DOPPLER ULTRASOUND OF OVARIES TECHNIQUE: Both transabdominal and transvaginal ultrasound examinations of the pelvis were performed. Transabdominal technique was performed for global imaging of the pelvis including uterus, ovaries, adnexal regions, and pelvic cul-de-sac. It was necessary to proceed with endovaginal exam following the transabdominal exam to visualize the ovaries and better visualize the uterus and endometrium. Color and duplex Doppler ultrasound was utilized to evaluate blood flow to the ovaries. COMPARISON:  Ob ultrasound dated 08/01/2023 FINDINGS: Uterus  Measurements: 9.8 x 5.9 x 4.4 cm = volume: 132 mL. No fibroids or other mass visualized. Endometrium Thickness: 10.3 mm. No focal abnormality visualized. No abnormal vascularity. Right ovary Measurements: 4.6 x 2.2 x 1.8 cm = volume: 9.5 mL. Normal appearance/no adnexal mass. Left ovary Measurements: 3.7 x 2.2 x 1.7 cm = volume: 7.2 mL. Normal appearance/no adnexal mass. Pulsed Doppler evaluation of both ovaries demonstrates normal low-resistance arterial and venous waveforms. Other findings No abnormal free fluid. IMPRESSION: Normal examination.  No evidence of retained products of conception. Electronically Signed   By: Elspeth Bathe M.D.   On: 08/08/2023 17:26   US  Art/Ven Flow Abd Pelv Doppler Result Date: 08/08/2023 CLINICAL DATA:  Pelvic pain, vaginal spotting and foul odor. Status post D and C on 08/01/2023 EXAM: TRANSABDOMINAL AND TRANSVAGINAL ULTRASOUND OF PELVIS DOPPLER ULTRASOUND OF OVARIES TECHNIQUE: Both transabdominal and transvaginal ultrasound examinations of the pelvis were performed. Transabdominal technique was performed for global imaging of the pelvis including uterus, ovaries, adnexal regions, and pelvic cul-de-sac. It was necessary to proceed with endovaginal exam following the transabdominal exam to visualize the ovaries and better visualize the uterus and endometrium. Color and duplex Doppler ultrasound was utilized to evaluate blood flow to the ovaries. COMPARISON:  Ob ultrasound dated 08/01/2023 FINDINGS: Uterus Measurements: 9.8 x 5.9 x 4.4 cm = volume: 132 mL. No fibroids or other mass visualized. Endometrium Thickness: 10.3 mm. No focal abnormality visualized. No abnormal vascularity. Right ovary Measurements: 4.6 x 2.2 x 1.8 cm = volume: 9.5 mL. Normal appearance/no adnexal mass. Left ovary Measurements: 3.7 x 2.2 x 1.7 cm = volume: 7.2 mL. Normal appearance/no adnexal mass. Pulsed Doppler evaluation of both ovaries demonstrates normal low-resistance arterial and venous waveforms.  Other findings No abnormal free fluid. IMPRESSION: Normal examination.  No evidence of retained products of conception. Electronically Signed   By: Elspeth Bathe M.D.   On: 08/08/2023 17:26    Procedures Procedures    Medications Ordered in ED Medications - No data to display  ED Course/ Medical Decision Making/ A&P                                 Medical Decision Making Patient is doing well at this time and is stable for discharge home.  Discussed  with patient that blood work has been overall unremarkable demonstrated no associated leukocytosis.  Ultrasound of the pelvis demonstrated no signs of acute retained products and no indication underlying etiology such as tubo-ovarian abscess, PID, ovarian torsion or cyst.  Do suspect that her ongoing discharge is secondary to her recent D&C.  Do not suspect infectious process at this point and do not suspect that antibiotics are warranted.  Abdominal exam is otherwise benign I do not underlying etiology such as acute appendicitis, cholecystitis, bowel suction, diverticulitis, mesenteric ischemia, pyelonephritis, kidney stone, pancreatitis.  Patient had a wet prep which demonstrated no associated bacterial vaginosis, trichomonas, candida.  The need for continued close follow-up with her gynecologist on outpatient basis was discussed as well as strict turn precautions for any new or worsening symptoms.  Will provide additional pain control on an outpatient basis.  Patient is requesting Dilaudid  at this time though did discuss with her that we will not prescribe this on an outpatient basis.  She was provided with return precautions were provided for any new or worsening symptoms.  Patient voiced understanding and had no additional questions.  Patient case was fully discussed with attending physician who is in agreement to plan.  Amount and/or Complexity of Data Reviewed Labs: ordered.  Risk Prescription drug management.           Final  Clinical Impression(s) / ED Diagnoses Final diagnoses:  None    Rx / DC Orders ED Discharge Orders     None         Daralene Lonni JONETTA DEVONNA 08/08/23 2312    Cleotilde Rogue, MD 08/09/23 1336

## 2023-08-08 NOTE — Telephone Encounter (Addendum)
 Chief Complaint: foul-smelling vaginal bleeding and discharge Symptoms: foul-smelling vaginal discharge/bleeding, 8/10 lower abdominal and groin pain, nausea, diarrhea, loss of appetite Frequency: since Friday 1/31 Pertinent Negatives: Patient denies fever, CP, SOB, N/V Disposition: [x] ED /[] Urgent Care (no appt availability in office) / [] Appointment(In office/virtual)/ []  [facility]/ [] Home Care/ [] Refused Recommended Disposition /[] [place] [facility]/ []  Follow-up with PCP Additional Notes: Pt reports foul-smelling vaginal discharge and bleeding after a D&C at [facility] on 1/28. Pt reports 8/10 lower abdominal and groin pain as well. States the pain today is worse than it was right after the procedure. Endorses nausea (since the beginning of pregnancy), diarrhea, and loss of appetite. States she is forcing herself to eat. Pt checked her temp for RN on the phone - temp 99.1 orally. Pt also checked her other vitals - BP 112/85, O2 97, HR 95. Pt states pain is so severe it hurts to even sit on the toilet. Pt states her discharge smells like feces. Pt states her discharge/blood was red after the D&C, now states it is dark brown and watery. Per protocol, RN advised pt to go to the ED. Pt asked RN if she should go to [facility] since she had the procedure there, RN replied yes. Pt states she will go with her boyfriend and leave now. RN advised pt should call 911 if she worsens before leaving the house. Pt states she does not need an ambulance but verbalized understanding.   Of note - pt called her OB office before calling her PCP. Pt states when she called the OB there was a recording that said the office was closed.   Copied from CRM [id number]. Topic: Clinical - Red Word Triage >> Aug 08, 2023  1:07 PM Ivette P wrote: [facility] that prompted transfer to Nurse Triage: Pt had dnc last week, one of the complications is smelling ood order with discharge Reason for Disposition  [1] SEVERE  abdominal pain (e.g., excruciating) AND [2] present > 1 hour  Answer Assessment - Initial Assessment Questions 1. DISCHARGE: Describe the discharge. (e.g., white, yellow, green, gray, foamy, cottage cheese-like)     Dark brown and watery 2. ODOR: Is there a bad odor?     Foul smelling as of yesterday 3. ONSET: When did the discharge begin?     Bleeding since D&C on 1/28 - now brown instead of red 4. RASH: Is there a rash in the genital area? If Yes, ask: Describe it. (e.g., redness, blisters, sores, bumps)     No 5. ABDOMEN PAIN: Are you having any abdomen pain? If Yes, ask: What does it feel like?  (e.g., crampy, dull, intermittent, constant)      8/10 lower abdominal pain and pain inside downstairs. Pain is worse when she sits down, better when she lays down 6. ABDOMEN PAIN SEVERITY: If present, ask: How bad is it? (e.g., Scale 1-10; mild, moderate, or severe)   - MILD (1-3): Doesn't interfere with normal activities, abdomen soft and not tender to touch.    - MODERATE (4-7): Interferes with normal activities or awakens from sleep, abdomen tender to touch.    - SEVERE (8-10): Excruciating pain, doubled over, unable to do any normal activities. (R/O peritonitis)      8/10 pain 7. CAUSE: What do you think is causing the discharge? Have you had the same problem before? What happened then?     D&C on 1/28 8. OTHER SYMPTOMS: Do you have any other symptoms? (e.g., fever, itching,  vaginal bleeding, pain with urination, injury to genital area, vaginal foreign body)     Pain has picked back up - 2 large clots came out on Wednesday the day after the procedure. Pain improved for 36 hours, now pain is worse. Pain is 8/10. Abdominal pain is in the lower abdomen and downstairs. Shooting pain is on the inside. Hurts to sit down, even on a toilet. Pt states blood smells like feces. Nausea ever since the start of pregnancy, still has not gone away. No fever, as far as the pt  noticed. Loss of appetite for a few days, forcing herself to eat. Pt endorses diarrhea, no vomiting. Temp 99.75F orally.  Protocols used: Vaginal Discharge-A-AH

## 2023-08-08 NOTE — Telephone Encounter (Signed)
Please inform the patient that they should contact their OB-GYN due to a recent D&C or visit the emergency department if symptoms worsen. The patient is also welcome to come in as a walk-in for a NuSwab test if interested

## 2023-08-08 NOTE — ED Triage Notes (Signed)
Pt arrived via POV from home c/o vaginal bleeding and foul odor. Pt reports recent D&C last week. Pt now endorses stabbing pain in groin.

## 2023-08-08 NOTE — Discharge Instructions (Addendum)
Please stop taking the ketorolac that you were prescribed and start taking the Voltaren.  Please follow-up closely with your gynecologist on an outpatient basis.  Return to emergency department immediately for any new or worsening symptoms.

## 2023-08-08 NOTE — Telephone Encounter (Signed)
Pt states she has an odor with bleeding and has pain when sitting. Pt was advised to go to the ER by the after hours nurse. Pt states she would like a call before she goes to the ER.

## 2023-08-09 NOTE — Telephone Encounter (Signed)
 Pt. Visited ED yesterday, they stated there was nothing wrong. They prescribed antiinflammatory and Norco and the antiinflammatory is not working and the [employer] she has reactions to. Explained she needed to contact her GYN further evaluation

## 2023-08-10 LAB — GC/CHLAMYDIA PROBE AMP (~~LOC~~) NOT AT ARMC
Chlamydia: NEGATIVE
Comment: NEGATIVE
Comment: NORMAL
Neisseria Gonorrhea: NEGATIVE

## 2023-08-11 LAB — ANORA MISCARRIAGE TEST - FRESH

## 2023-08-14 NOTE — Addendum Note (Signed)
 Addended by: Johny Nap L on: 08/14/2023 10:48 AM   Modules accepted: Orders

## 2023-08-14 NOTE — Telephone Encounter (Signed)
 [facility] wasn't able to use Dr. Margrette Shield order for the MRI brain since she is no longer an authorized provider in the system. Can you put in a new order please?

## 2023-08-14 NOTE — Telephone Encounter (Signed)
 I let GI know there is a new order and they already attempted to get her scheduled today and had to leave a message.

## 2023-08-14 NOTE — Telephone Encounter (Signed)
 New order for MRI brain placed as requested.  Thank you.

## 2023-08-22 DIAGNOSIS — F411 Generalized anxiety disorder: Secondary | ICD-10-CM | POA: Diagnosis not present

## 2023-08-22 DIAGNOSIS — F3160 Bipolar disorder, current episode mixed, unspecified: Secondary | ICD-10-CM | POA: Diagnosis not present

## 2023-08-24 ENCOUNTER — Telehealth: Payer: Self-pay | Admitting: Family Medicine

## 2023-08-24 NOTE — Telephone Encounter (Signed)
Copied from CRM [id number]. Topic: Referral - Status >> Aug 24, 2023  1:30 PM Geneva B wrote: Reason for CRM: patient wants to know what is the status of her dermatology referral [address]  referral please call patient back [phone]

## 2023-08-24 NOTE — Telephone Encounter (Signed)
Spoke with the referral team and they recommended she reach out to them to inquire about the wait list and how much longer she can expect it to be before she is scheduled. [facility] [phone] (she was sent here bc they are the ones that accept her insurance)

## 2023-09-02 ENCOUNTER — Ambulatory Visit
Admission: RE | Admit: 2023-09-02 | Discharge: 2023-09-02 | Disposition: A | Discharge status: Home / Self Care | Payer: Medicaid Other | Source: Ambulatory Visit | Attending: Adult Health | Admitting: Adult Health

## 2023-09-02 DIAGNOSIS — R519 Headache, unspecified: Secondary | ICD-10-CM

## 2023-09-02 DIAGNOSIS — G43719 Chronic migraine without aura, intractable, without status migrainosus: Secondary | ICD-10-CM

## 2023-09-04 ENCOUNTER — Encounter: Payer: Self-pay | Admitting: Adult Health

## 2023-09-12 DIAGNOSIS — B360 Pityriasis versicolor: Secondary | ICD-10-CM | POA: Diagnosis not present

## 2023-09-19 DIAGNOSIS — Z79899 Other long term (current) drug therapy: Secondary | ICD-10-CM | POA: Diagnosis not present

## 2023-09-19 DIAGNOSIS — F3160 Bipolar disorder, current episode mixed, unspecified: Secondary | ICD-10-CM | POA: Diagnosis not present

## 2023-10-10 ENCOUNTER — Ambulatory Visit

## 2023-10-10 ENCOUNTER — Other Ambulatory Visit: Payer: Self-pay | Admitting: Adult Health

## 2023-10-10 VITALS — BP 125/82 | HR 89 | Ht 63.0 in | Wt 199.0 lb

## 2023-10-10 DIAGNOSIS — Z789 Other specified health status: Secondary | ICD-10-CM | POA: Diagnosis not present

## 2023-10-10 DIAGNOSIS — Z3201 Encounter for pregnancy test, result positive: Secondary | ICD-10-CM

## 2023-10-10 DIAGNOSIS — N96 Recurrent pregnancy loss: Secondary | ICD-10-CM | POA: Diagnosis not present

## 2023-10-10 LAB — POCT URINE PREGNANCY: Preg Test, Ur: POSITIVE — AB

## 2023-10-10 MED ORDER — ONDANSETRON HCL 4 MG PO TABS: 4.0000 mg | ORAL_TABLET | Freq: Three times a day (TID) | ORAL | 1 refills | Status: DC | PRN

## 2023-10-10 NOTE — Progress Notes (Signed)
   NURSE VISIT- PREGNANCY CONFIRMATION   SUBJECTIVE:  Katherine Mora is a 28 y.o. [id number] female at Unknown by uncertain LMP of No LMP recorded (lmp unknown). Patient is pregnant. Here for pregnancy confirmation.  Home pregnancy test: positive x 3   She reports  light brown spotting this week .  She is taking prenatal vitamins.  States she had a D&C in January but has not had a period since. 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   OBJECTIVE:  BP 125/82 (BP Location: Right Arm, Patient Position: Sitting, Cuff Size: Normal)   Pulse 89   Ht 5\' 3"  (1.6 m)   Wt 199 lb (90.3 kg)   LMP  (LMP Unknown)   BMI 35.25 kg/m   Appears well, in no apparent distress  Results for orders placed or performed in visit on 10/10/23 (from the past 24 hours)  POCT urine pregnancy   Collection Time: 10/10/23  3:21 PM  Result Value Ref Range   Preg Test, Ur Positive (A) Negative    ASSESSMENT: Positive pregnancy test, Unknown by LMP    PLAN: Schedule for dating ultrasound TBD based on labs Prenatal vitamins: continue   Nausea medicines: requested-note routed to Cyril Mourning, NP to send prescription   OB packet given: Yes  Jobe Marker  10/10/2023 4:06 PM

## 2023-10-10 NOTE — Progress Notes (Signed)
 Rx zofran

## 2023-10-11 ENCOUNTER — Other Ambulatory Visit: Payer: Self-pay | Admitting: Adult Health

## 2023-10-11 LAB — BETA HCG QUANT (REF LAB): hCG Quant: 7001 m[IU]/mL

## 2023-10-11 LAB — PROGESTERONE: Progesterone: 5 ng/mL

## 2023-10-11 MED ORDER — PROGESTERONE 200 MG PO CAPS: ORAL_CAPSULE | ORAL | 3 refills | Status: DC

## 2023-10-17 ENCOUNTER — Other Ambulatory Visit: Payer: Self-pay | Admitting: Obstetrics & Gynecology

## 2023-10-17 DIAGNOSIS — F3160 Bipolar disorder, current episode mixed, unspecified: Secondary | ICD-10-CM | POA: Diagnosis not present

## 2023-10-17 DIAGNOSIS — O2620 Pregnancy care for patient with recurrent pregnancy loss, unspecified trimester: Secondary | ICD-10-CM

## 2023-10-17 DIAGNOSIS — O3680X0 Pregnancy with inconclusive fetal viability, not applicable or unspecified: Secondary | ICD-10-CM

## 2023-10-21 ENCOUNTER — Other Ambulatory Visit: Payer: Self-pay | Admitting: Allergy & Immunology

## 2023-10-24 ENCOUNTER — Other Ambulatory Visit

## 2023-10-24 ENCOUNTER — Ambulatory Visit (INDEPENDENT_AMBULATORY_CARE_PROVIDER_SITE_OTHER): Admitting: Radiology

## 2023-10-24 ENCOUNTER — Encounter: Payer: Self-pay | Admitting: Radiology

## 2023-10-24 DIAGNOSIS — O3680X0 Pregnancy with inconclusive fetal viability, not applicable or unspecified: Secondary | ICD-10-CM

## 2023-10-24 DIAGNOSIS — Z8759 Personal history of other complications of pregnancy, childbirth and the puerperium: Secondary | ICD-10-CM | POA: Diagnosis not present

## 2023-10-24 DIAGNOSIS — Z3A08 8 weeks gestation of pregnancy: Secondary | ICD-10-CM

## 2023-10-24 DIAGNOSIS — O2620 Pregnancy care for patient with recurrent pregnancy loss, unspecified trimester: Secondary | ICD-10-CM | POA: Diagnosis not present

## 2023-10-24 DIAGNOSIS — Z3201 Encounter for pregnancy test, result positive: Secondary | ICD-10-CM | POA: Diagnosis not present

## 2023-10-24 NOTE — Progress Notes (Signed)
 US :  GA unknown LMP Anteverted uterus w single viable early IUP,  GS intact, CRL = 19 mm = 8+3 wks, YS = 5.7 mm Normal ov's bilaterally, neg adnexal regions - neg CDS - no free fluid present EDD by todays US  = 06-01-24

## 2023-10-25 LAB — PROGESTERONE: Progesterone: 10.6 ng/mL

## 2023-11-03 ENCOUNTER — Emergency Department (HOSPITAL_COMMUNITY)
Admission: EM | Admit: 2023-11-03 | Discharge: 2023-11-03 | Disposition: A | Discharge status: Home / Self Care | Attending: Emergency Medicine | Admitting: Emergency Medicine

## 2023-11-03 ENCOUNTER — Other Ambulatory Visit: Payer: Self-pay

## 2023-11-03 ENCOUNTER — Encounter (HOSPITAL_COMMUNITY): Payer: Self-pay

## 2023-11-03 DIAGNOSIS — O039 Complete or unspecified spontaneous abortion without complication: Secondary | ICD-10-CM | POA: Diagnosis not present

## 2023-11-03 DIAGNOSIS — Z3A10 10 weeks gestation of pregnancy: Secondary | ICD-10-CM | POA: Diagnosis not present

## 2023-11-03 DIAGNOSIS — O200 Threatened abortion: Secondary | ICD-10-CM | POA: Diagnosis not present

## 2023-11-03 DIAGNOSIS — O209 Hemorrhage in early pregnancy, unspecified: Secondary | ICD-10-CM | POA: Diagnosis present

## 2023-11-03 DIAGNOSIS — Z3A01 Less than 8 weeks gestation of pregnancy: Secondary | ICD-10-CM | POA: Diagnosis not present

## 2023-11-03 DIAGNOSIS — Z91040 Latex allergy status: Secondary | ICD-10-CM | POA: Diagnosis not present

## 2023-11-03 LAB — URINALYSIS, ROUTINE W REFLEX MICROSCOPIC
Bacteria, UA: NONE SEEN
Bilirubin Urine: NEGATIVE
Glucose, UA: NEGATIVE mg/dL
Ketones, ur: NEGATIVE mg/dL
Leukocytes,Ua: NEGATIVE
Nitrite: NEGATIVE
Protein, ur: NEGATIVE mg/dL
Specific Gravity, Urine: 1.023 (ref 1.005–1.030)
pH: 6 (ref 5.0–8.0)

## 2023-11-03 LAB — CBC WITH DIFFERENTIAL/PLATELET
Abs Immature Granulocytes: 0.01 10*3/uL (ref 0.00–0.07)
Basophils Absolute: 0 10*3/uL (ref 0.0–0.1)
Basophils Relative: 0 %
Eosinophils Absolute: 0.1 10*3/uL (ref 0.0–0.5)
Eosinophils Relative: 2 %
HCT: 42.3 % (ref 36.0–46.0)
Hemoglobin: 14.2 g/dL (ref 12.0–15.0)
Immature Granulocytes: 0 %
Lymphocytes Relative: 31 %
Lymphs Abs: 2.4 10*3/uL (ref 0.7–4.0)
MCH: 29.6 pg (ref 26.0–34.0)
MCHC: 33.6 g/dL (ref 30.0–36.0)
MCV: 88.3 fL (ref 80.0–100.0)
Monocytes Absolute: 0.5 10*3/uL (ref 0.1–1.0)
Monocytes Relative: 7 %
Neutro Abs: 4.6 10*3/uL (ref 1.7–7.7)
Neutrophils Relative %: 60 %
Platelets: 209 10*3/uL (ref 150–400)
RBC: 4.79 MIL/uL (ref 3.87–5.11)
RDW: 13.2 % (ref 11.5–15.5)
WBC: 7.6 10*3/uL (ref 4.0–10.5)
nRBC: 0 % (ref 0.0–0.2)

## 2023-11-03 LAB — HCG, QUANTITATIVE, PREGNANCY: hCG, Beta Chain, Quant, S: 53752 m[IU]/mL — ABNORMAL HIGH (ref <5)

## 2023-11-03 NOTE — Discharge Instructions (Addendum)
 You were seen in the emergency department for spotting at approximately 10 weeks of pregnancy.  Your lab work did not show any significant abnormalities.  Please return for ultrasound tomorrow 9am.  Follow-up with your OB team.

## 2023-11-03 NOTE — ED Provider Notes (Signed)
 [place] EMERGENCY DEPARTMENT AT [facility] Provider Note   CSN: [id number] Arrival date & time: 11/03/23  2124     History {Add pertinent medical, surgical, social history, OB history to HPI:1} Chief Complaint  Patient presents with   Vaginal Bleeding    Katherine Mora is a 28 y.o. female.  She is approximately [redacted] weeks pregnant.  She is complaining of some spotting and lower abdominal discomfort that started today.  She said she talked to her OB group who recommended she come here for further evaluation.  She said she has had multiple miscarriages in the past and this is the longest she has carried a pregnancy.  Is asking for her progestrone level to be checked as it is running low in the past.  [facility] but is hoping to be switching to a [facility] group  The history is provided by the patient.  Vaginal Bleeding Quality:  Spotting Duration:  1 day Timing:  Sporadic Progression:  Unchanged Possible pregnancy: yes   Context: spontaneously   Relieved by:  None tried Worsened by:  Nothing Ineffective treatments:  None tried Associated symptoms: abdominal pain   Associated symptoms: no dysuria, no fever and no nausea        Home Medications Prior to Admission medications   Medication Sig Start Date End Date Taking? Authorizing Provider  progesterone  (PROMETRIUM ) 200 MG capsule Place 1 capsule in vagina at bedtime nightly 10/11/23   Lendia Quay A, NP  acetaminophen  (TYLENOL ) 500 MG tablet Take 1,000 mg by mouth every 6 (six) hours as needed for moderate pain (pain score 4-6). Patient not taking: Reported on 10/10/2023    [provider]  cetirizine  (ZYRTEC  ALLERGY ) 10 MG tablet Take 1-2 tablets by mouth daily 02/01/23   Rochester Chuck, MD  diclofenac  (VOLTAREN ) 75 MG EC tablet Take 1 tablet (75 mg total) by mouth 2 (two) times daily. Patient not taking: Reported on 10/10/2023 08/08/23   Roselynn Connors, PA-C   EPINEPHrine  (EPIPEN  2-PAK) 0.3 mg/0.3 mL IJ SOAJ injection Inject 0.3 mg into the muscle as needed for anaphylaxis. Patient not taking: Reported on 10/10/2023 02/01/23   Rochester Chuck, MD  MAGNESIUM PO Take 483 mg by mouth daily.    [provider]  ondansetron  (ZOFRAN ) 4 MG tablet Take 1 tablet (4 mg total) by mouth every 8 (eight) hours as needed. 10/10/23   Javan Messing, NP  Prenatal Vit-Fe Fumarate-FA (PRENATAL VITAMIN PO) Take 1 tablet by mouth daily.    [provider]  VENTOLIN  HFA 108 (90 Base) MCG/ACT inhaler Inhale 2 puffs into the lungs every 4 (four) hours as needed for wheezing or shortness of breath. Patient not taking: Reported on 10/10/2023 12/23/22   [provider]  zafirlukast  (ACCOLATE ) 10 MG tablet TAKE 1 TABLET BY MOUTH TWICE A DAY. 04/19/21   Rochester Chuck, MD      Allergies    Fentanyl, Mometasone furo-formoterol fum, Montelukast, Penicillins, Tricyclic antidepressants, Venlafaxine hcl, Clindamycin, Latex, and Prednisone    Review of Systems   Review of Systems  Constitutional:  Negative for fever.  Gastrointestinal:  Positive for abdominal pain. Negative for nausea.  Genitourinary:  Positive for vaginal bleeding. Negative for dysuria.    Physical Exam Updated Vital Signs BP 127/89   Pulse 89   Ht 5\' 4"  (1.626 m)   Wt 88.9 kg   LMP  (LMP Unknown)   SpO2 98%   BMI 33.64 kg/m  Physical Exam Vitals and nursing note reviewed.  Constitutional:      General: She is not in acute distress.    Appearance: Normal appearance. She is well-developed.  HENT:     Head: Normocephalic and atraumatic.  Eyes:     Conjunctiva/sclera: Conjunctivae normal.  Cardiovascular:     Rate and Rhythm: Normal rate and regular rhythm.     Heart sounds: No murmur heard. Pulmonary:     Effort: Pulmonary effort is normal.     Breath sounds: Normal breath sounds.  Abdominal:     Palpations: Abdomen is soft.     Tenderness: There is no  abdominal tenderness. There is no guarding or rebound.  Musculoskeletal:        General: No deformity.     Cervical back: Neck supple.  Skin:    General: Skin is warm and dry.  Neurological:     General: No focal deficit present.     Mental Status: She is alert.     GCS: GCS eye subscore is 4. GCS verbal subscore is 5. GCS motor subscore is 6.     ED Results / Procedures / Treatments   Labs (all labs ordered are listed, but only abnormal results are displayed) Labs Reviewed  CBC WITH DIFFERENTIAL/PLATELET  COMPREHENSIVE METABOLIC PANEL WITH GFR  HCG, QUANTITATIVE, PREGNANCY  URINALYSIS, ROUTINE W REFLEX MICROSCOPIC    EKG None  Radiology No results found.  Procedures Procedures  {Document cardiac monitor, telemetry assessment procedure when appropriate:1}  Medications Ordered in ED Medications - No data to display  ED Course/ Medical Decision Making/ A&P   {   Click here for ABCD2, HEART and other calculatorsREFRESH Note before signing :1}                              Medical Decision Making Amount and/or Complexity of Data Reviewed Labs: ordered.   This patient complains of ***; this involves an extensive number of treatment Options and is a complaint that carries with it a high risk of complications and morbidity. The differential includes ***  I ordered, reviewed and interpreted labs, which included *** I ordered medication *** and reviewed PMP when indicated. I ordered imaging studies which included *** and I independently    visualized and interpreted imaging which showed *** Additional history obtained from *** Previous records obtained and reviewed *** I consulted *** and discussed lab and imaging findings and discussed disposition.  Cardiac monitoring reviewed, *** Social determinants considered, *** Critical Interventions: ***  After the interventions stated above, I reevaluated the patient and found *** Admission and further testing considered,  ***   {Document critical care time when appropriate:1} {Document review of labs and clinical decision tools ie heart score, Chads2Vasc2 etc:1}  {Document your independent review of radiology images, and any outside records:1} {Document your discussion with family members, caretakers, and with consultants:1} {Document social determinants of health affecting pt's care:1} {Document your decision making why or why not admission, treatments were needed:1} Final Clinical Impression(s) / ED Diagnoses Final diagnoses:  None    Rx / DC Orders ED Discharge Orders     None

## 2023-11-03 NOTE — ED Triage Notes (Signed)
 Pt has only had enough blood to cover toilet paper and is now spotting

## 2023-11-03 NOTE — ED Triage Notes (Signed)
 Pt stated that she began having some vaginal bleeding earlier tonight. Pt has had 9 miscarriages and she is currently [redacted] weeks pregnant.

## 2023-11-04 ENCOUNTER — Ambulatory Visit (HOSPITAL_COMMUNITY)
Admission: RE | Admit: 2023-11-04 | Discharge: 2023-11-04 | Disposition: A | Discharge status: Home / Self Care | Source: Ambulatory Visit | Attending: Emergency Medicine | Admitting: Emergency Medicine

## 2023-11-04 ENCOUNTER — Other Ambulatory Visit (HOSPITAL_COMMUNITY): Payer: Self-pay | Admitting: Emergency Medicine

## 2023-11-04 DIAGNOSIS — R52 Pain, unspecified: Secondary | ICD-10-CM | POA: Diagnosis not present

## 2023-11-04 DIAGNOSIS — O200 Threatened abortion: Secondary | ICD-10-CM

## 2023-11-04 DIAGNOSIS — Z3A10 10 weeks gestation of pregnancy: Secondary | ICD-10-CM | POA: Diagnosis not present

## 2023-11-04 DIAGNOSIS — O208 Other hemorrhage in early pregnancy: Secondary | ICD-10-CM | POA: Diagnosis not present

## 2023-11-04 LAB — COMPREHENSIVE METABOLIC PANEL WITH GFR
ALT: 14 U/L (ref 0–44)
AST: 17 U/L — ABNORMAL LOW (ref 15–41)
Albumin: 3.8 g/dL (ref 3.5–5.0)
Alkaline Phosphatase: 41 U/L (ref 38–126)
Anion gap: 8 (ref 5–15)
BUN: 15 mg/dL (ref 6–20)
CO2: 21 mmol/L — ABNORMAL LOW (ref 22–32)
Calcium: 9 mg/dL (ref 8.9–10.3)
Chloride: 104 mmol/L (ref 98–111)
Creatinine, Ser: 0.58 mg/dL (ref 0.44–1.00)
GFR, Estimated: >60 mL/min (ref >60)
Glucose, Bld: 98 mg/dL (ref 70–99)
Potassium: 3.5 mmol/L (ref 3.5–5.1)
Sodium: 133 mmol/L — ABNORMAL LOW (ref 135–145)
Total Bilirubin: 0.4 mg/dL (ref 0.0–1.2)
Total Protein: 6.5 g/dL (ref 6.5–8.1)

## 2023-11-04 NOTE — ED Provider Notes (Signed)
   Patient returned this morning for scheduled outpatient OB ultrasound of less than 14 weeks.  Patient seen here yesterday for vaginal bleeding and lower abdominal discomfort.  Approximately [redacted] weeks pregnant by LMP   Patient left the department prior to ultrasound results.  Patient was called, demographics verified and results were given over the phone.  Ultrasound shows single IUP of 10 weeks.  [facility] [place] but she is trying to switch to a OB/GYN in [place] and plans to contact them on Monday    US  OP OB Comp Less 14 Wks Result Date: 11/04/2023 CLINICAL DATA:  28 year old female with vaginal bleeding in the 1st trimester of pregnancy. Quantitative beta HCG [id number]. Assigned gestational age [redacted] weeks and 0 days. EXAM: OBSTETRIC <14 WK ULTRASOUND TECHNIQUE: Transabdominal ultrasound was performed for evaluation of the gestation as well as the maternal uterus and adnexal regions. COMPARISON:  10/04/2023 ultrasound. FINDINGS: Intrauterine gestational sac: Single Yolk sac:  Not visible Embryo:  Visible Cardiac Activity: Detected Heart Rate: 185 bpm CRL:   30.8 mm   10 w 0 d                  US  EDC: 06/01/2024 Subchorionic hemorrhage:  Up to small volume Maternal uterus/adnexae: Both ovaries appear normal. No pelvis free fluid. IMPRESSION: 1. Single living IUP demonstrated with estimated gestational age of [redacted] weeks and 0 days by CRL. 2. Up to small volume subchorionic hemorrhage. Electronically Signed   By: Marlise Simpers M.D.   On: 11/04/2023 10:56       Catherne Clubs, PA-C 11/04/23 1129    Dorenda Gandy, MD 11/04/23 [phone]

## 2023-11-05 LAB — PROGESTERONE: Progesterone: 7.9 ng/mL

## 2023-11-07 ENCOUNTER — Encounter: Admitting: Women's Health

## 2023-11-07 ENCOUNTER — Ambulatory Visit (INDEPENDENT_AMBULATORY_CARE_PROVIDER_SITE_OTHER): Admitting: Obstetrics and Gynecology

## 2023-11-07 VITALS — BP 122/84 | HR 69 | Wt 200.0 lb

## 2023-11-07 DIAGNOSIS — Z3A10 10 weeks gestation of pregnancy: Secondary | ICD-10-CM

## 2023-11-07 DIAGNOSIS — Z09 Encounter for follow-up examination after completed treatment for conditions other than malignant neoplasm: Secondary | ICD-10-CM

## 2023-11-07 DIAGNOSIS — Z3491 Encounter for supervision of normal pregnancy, unspecified, first trimester: Secondary | ICD-10-CM | POA: Diagnosis not present

## 2023-11-07 DIAGNOSIS — N96 Recurrent pregnancy loss: Secondary | ICD-10-CM

## 2023-11-07 DIAGNOSIS — R7989 Other specified abnormal findings of blood chemistry: Secondary | ICD-10-CM

## 2023-11-07 NOTE — Progress Notes (Unsigned)
   PRENATAL VISIT NOTE  Subjective:  Katherine Mora is a 28 y.o. G3P0020 at [redacted]w[redacted]d being seen today for ongoing prenatal care.  She is currently monitored for the following issues for this {Blank single:19197::"high-risk","low-risk"} pregnancy and has Food intolerance; Seasonal allergic rhinitis due to pollen; Mild intermittent asthma, uncomplicated; Fibromyalgia; Hx of migraines; Bipolar 1 disorder (HCC); History of methamphetamine abuse (HCC); Left breast lump; History of recurrent miscarriages; Hyperpigmented skin lesion; Malabsorption; Family history of sarcoidosis; and Missed ab on their problem list.  Patient reports  sharp pain and bleeding 5/2 right ovary. Bleeding stops after progesterone  at night and then starts again in the morning  .  Contractions: Not present. Vag. Bleeding: Scant (when she wipes).   . Denies leaking of fluid.   The following portions of the patient's history were reviewed and updated as appropriate: allergies, current medications, past family history, past medical history, past social history, past surgical history and problem list.   Objective:   Vitals:   11/07/23 1328  BP: 122/84  Pulse: 69  Weight: 200 lb (90.7 kg)    Fetal Status:           General:  Alert, oriented and cooperative. Patient is in no acute distress.  Skin: Skin is warm and dry. No rash noted.   Cardiovascular: Normal heart rate noted  Respiratory: Normal respiratory effort, no problems with respiration noted  Abdomen: Soft, gravid, appropriate for gestational age.  Pain/Pressure: Present     Pelvic: {Blank single:19197::"Cervical exam performed in the presence of a chaperone","Cervical exam deferred"}        Extremities: Normal range of motion.     Mental Status: Normal mood and affect. Normal behavior. Normal judgment and thought content.   Assessment and Plan:  Pregnancy: G3P0020 at [redacted]w[redacted]d 1. [redacted] weeks gestation of pregnancy (Primary) ***  2. Low serum  progesterone  ***  {Blank single:19197::"Term","Preterm"} labor symptoms and general obstetric precautions including but not limited to vaginal bleeding, contractions, leaking of fluid and fetal movement were reviewed in detail with the patient. Please refer to After Visit Summary for other counseling recommendations.   Return reschedule 5/21 appt with another provider.  Future Appointments  Date Time Provider Department Center  11/22/2023 10:00 AM [facility] - [facility] US  [facility] None  11/22/2023 10:45 AM Randel Buss Hayes Lipps, RN [facility]  11/22/2023 11:30 AM Jolayne Natter, CNM [facility]  12/28/2023  1:20 PM Del Abron Abt, FNP [facility]  01/31/2024  2:15 PM Johny Nap, NP [facility] None     Susi Eric, FNP

## 2023-11-08 ENCOUNTER — Encounter: Payer: Self-pay | Admitting: Physician Assistant

## 2023-11-08 LAB — CBC
Hematocrit: 43.2 % (ref 34.0–46.6)
Hemoglobin: 14.4 g/dL (ref 11.1–15.9)
MCH: 29.8 pg (ref 26.6–33.0)
MCHC: 33.3 g/dL (ref 31.5–35.7)
MCV: 89 fL (ref 79–97)
Platelets: 239 10*3/uL (ref 150–450)
RBC: 4.84 x10E6/uL (ref 3.77–5.28)
RDW: 13 % (ref 11.7–15.4)
WBC: 8.1 10*3/uL (ref 3.4–10.8)

## 2023-11-08 LAB — FERRITIN: Ferritin: 101 ng/mL (ref 15–150)

## 2023-11-21 ENCOUNTER — Other Ambulatory Visit: Payer: Self-pay | Admitting: Obstetrics & Gynecology

## 2023-11-21 DIAGNOSIS — Z3480 Encounter for supervision of other normal pregnancy, unspecified trimester: Secondary | ICD-10-CM | POA: Insufficient documentation

## 2023-11-21 DIAGNOSIS — Z3682 Encounter for antenatal screening for nuchal translucency: Secondary | ICD-10-CM

## 2023-11-21 DIAGNOSIS — O0990 Supervision of high risk pregnancy, unspecified, unspecified trimester: Secondary | ICD-10-CM | POA: Insufficient documentation

## 2023-11-22 ENCOUNTER — Encounter: Admitting: Women's Health

## 2023-11-22 ENCOUNTER — Ambulatory Visit: Admitting: Advanced Practice Midwife

## 2023-11-22 ENCOUNTER — Encounter: Payer: Self-pay | Admitting: Advanced Practice Midwife

## 2023-11-22 ENCOUNTER — Encounter: Admitting: *Deleted

## 2023-11-22 ENCOUNTER — Ambulatory Visit

## 2023-11-22 VITALS — BP 118/75 | HR 72 | Wt 202.0 lb

## 2023-11-22 DIAGNOSIS — Z3A12 12 weeks gestation of pregnancy: Secondary | ICD-10-CM | POA: Diagnosis not present

## 2023-11-22 DIAGNOSIS — Z3682 Encounter for antenatal screening for nuchal translucency: Secondary | ICD-10-CM

## 2023-11-22 DIAGNOSIS — Z3481 Encounter for supervision of other normal pregnancy, first trimester: Secondary | ICD-10-CM | POA: Diagnosis not present

## 2023-11-22 DIAGNOSIS — Z131 Encounter for screening for diabetes mellitus: Secondary | ICD-10-CM

## 2023-11-22 DIAGNOSIS — Z3480 Encounter for supervision of other normal pregnancy, unspecified trimester: Secondary | ICD-10-CM

## 2023-11-22 DIAGNOSIS — Z6834 Body mass index (BMI) 34.0-34.9, adult: Secondary | ICD-10-CM

## 2023-11-22 DIAGNOSIS — Z363 Encounter for antenatal screening for malformations: Secondary | ICD-10-CM | POA: Diagnosis not present

## 2023-11-22 DIAGNOSIS — N96 Recurrent pregnancy loss: Secondary | ICD-10-CM

## 2023-11-22 MED ORDER — ASPIRIN 81 MG PO CHEW: 162.0000 mg | CHEWABLE_TABLET | Freq: Every day | ORAL | 7 refills | Status: DC

## 2023-11-22 NOTE — Progress Notes (Signed)
 US  12+4 wks,measurements c/w dates,anterior placenta,normal ovaries,NT 1.6 mm,NB present,subserosal fibroid 1.4 x 1.4 x 1.1 cm,FHR 161 bpm,CRL 55.79 mm

## 2023-11-22 NOTE — Progress Notes (Signed)
 INITIAL OBSTETRICAL VISIT Patient name: Katherine Mora MRN [id number]  Date of birth: 1995-10-02 Chief Complaint:   Initial Prenatal Visit  History of Present Illness:   Katherine Mora is a 28 y.o. G94P0090 Caucasian female at [redacted]w[redacted]d by US  at 8.3 weeks with an Estimated Date of Delivery: 06/01/24 being seen today for her initial obstetrical visit.   No LMP recorded (lmp unknown). Patient is pregnant. Her obstetrical history is significant for multiple SABs.   Today she reports no complaints.  Last pap Aug 2022. Results were: NILM w/ HRHPV not done     11/22/2023   10:40 AM 07/24/2023   11:45 AM 06/29/2023    1:35 PM 01/13/2023   10:36 AM 12/30/2022   11:29 AM  Depression screen PHQ 2/9  Decreased Interest 0 0 0 0   Down, Depressed, Hopeless 0 0 0 0   PHQ - 2 Score 0 0 0 0   Altered sleeping 2 2 0 1   Tired, decreased energy 1 2 0 1   Change in appetite 0 0 0 1   Feeling bad or failure about yourself  0 0 0 1   Trouble concentrating 2 0 0 0   Moving slowly or fidgety/restless 0 0 0 0   Suicidal thoughts 0 0 0 0   PHQ-9 Score 5 4 0 4   Difficult doing work/chores   Not difficult at all  Somewhat difficult        11/22/2023   10:40 AM 07/24/2023   11:45 AM 06/29/2023    1:36 PM 01/13/2023   10:38 AM  GAD 7 : Generalized Anxiety Score  Nervous, Anxious, on Edge 2 0 0 1  Control/stop worrying 2 0 0 0  Worry too much - different things 1 1 0 0  Trouble relaxing 0 0 0 1  Restless 0 0 0 0  Easily annoyed or irritable 2 0 0 2  Afraid - awful might happen 0 0 0 0  Total GAD 7 Score 7 1 0 4  Anxiety Difficulty    Somewhat difficult     Review of Systems:   Pertinent items are noted in HPI Denies cramping/contractions, leakage of fluid, vaginal bleeding, abnormal vaginal discharge w/ itching/odor/irritation, headaches, visual changes, shortness of breath, chest pain, abdominal pain, severe nausea/vomiting, or problems with urination or bowel movements unless otherwise  stated above.  Pertinent History Reviewed:  Reviewed past medical,surgical, social, obstetrical and family history.  Reviewed problem list, medications and allergies. OB History  Gravida Para Term Preterm AB Living  10    9   SAB IAB Ectopic Multiple Live Births  9        # Outcome Date GA Lbr Len/2nd Weight Sex Type Anes PTL Lv  10 Current           9 SAB 07/2023          8 SAB 10/12/21          7 SAB 2017 [redacted]w[redacted]d         6 SAB           5 SAB           4 SAB           3 SAB           2 SAB           1 SAB            Physical Assessment:   Vitals:  11/22/23 1037  BP: 118/75  Pulse: 72  Weight: 202 lb (91.6 kg)  Body mass index is 34.67 kg/m.       Physical Examination:  General appearance - well appearing, and in no distress  Mental status - alert, oriented to person, place, and time  Psych:  She has a normal mood and affect  Skin - warm and dry, normal color, no suspicious lesions noted  Chest - effort normal, all lung fields clear to auscultation bilaterally  Heart - normal rate and regular rhythm  Abdomen - soft, nontender  Extremities:  No swelling or varicosities noted  Pelvic - not indicated  Thin prep pap is not done    TODAY'S NT US  12+4 wks,measurements c/w dates,anterior placenta,normal ovaries,NT 1.6 mm,NB present,subserosal fibroid 1.4 x 1.4 x 1.1 cm,FHR 161 bpm,CRL 55.79 mm   No results found for this or any previous visit (from the past 24 hours).  Assessment & Plan:  1) Low-Risk Pregnancy G10P0090 at [redacted]w[redacted]d with an Estimated Date of Delivery: 06/01/24   2) Initial OB visit  3) Hx recurrent SABs, currently taking vag progesterone ; rx bASA 162mg /day; requests another progesterone  level @ 14wks (order given); also requests more frequent visits for [facility] check  4) Asthma  5) Subserosal fibroid 1cm  Meds:  Meds ordered this encounter  Medications   aspirin 81 MG chewable tablet    Sig: Chew 2 tablets (162 mg total) by mouth daily.    Dispense:   60 tablet    Refill:  7    Supervising Provider:   Keene Pastures [[id number]]    Initial labs obtained Continue prenatal vitamins Reviewed n/v relief measures and warning s/s to report Reviewed recommended weight gain based on pre-gravid BMI Encouraged well-balanced diet Genetic & carrier screening discussed: requests Panorama and Horizon , requests NT/IT Ultrasound discussed; fetal survey: requested CCNC completed> form faxed if has or is planning to apply for medicaid The nature of [place] [facility] [employer] with multiple MDs and other Advanced Practice Providers was explained to patient; also emphasized that fellows, residents, and students are part of our team. Does have home bp cuff. Office bp cuff given: no. Rx sent: n/a. Check bp weekly, let us  know if consistently >140/90.   Indications for ASA therapy (per uptodate) OR Two or more of the following: Nulliparity Yes Obesity (BMI>30 kg/m2) Yes Personal risk factors (eg, previous pregnancy w/ LBW or SGA, previous adverse pregnancy outcome [eg, stillbirth], interval >10 years between pregnancies)> multiple SABs Yes   Follow-up: Return for 2wk LROB (FHT check); 4wk LROB & 2nd IT; 7wk LROB & anatomy u/s.   Orders Placed This Encounter  Procedures   Urine Culture   GC/Chlamydia Probe Amp   US  OB Comp + 14 Wk   PANORAMA PRENATAL TEST   HORIZON CUSTOM   Hemoglobin A1c   CBC/D/Plt+RPR+Rh+ABO+RubIgG...   Integrated 1   Progesterone     Jolayne Natter CNM 11/22/2023 11:56 AM

## 2023-11-22 NOTE — Patient Instructions (Signed)
 Katherine Mora, thank you for choosing our office today! We appreciate the opportunity to meet your healthcare needs. You may receive a short survey by mail, e-mail, or through [employer]. If you are happy with your care we would appreciate if you could take just a few minutes to complete the survey questions. We read all of your comments and take your feedback very seriously. Thank you again for choosing our office.  Center for [employer] [facility] Team at [facility]  [facility] at [facility] ([address] [place], [place] [address]) Entrance C, located off of [address] Free 24/7 valet parking   Nausea & Vomiting Have saltine crackers or pretzels by your bed and eat a few bites before you raise your head out of bed in the morning Eat small frequent meals throughout the day instead of large meals Drink plenty of fluids throughout the day to stay hydrated, just don't drink a lot of fluids with your meals.  This can make your stomach fill up faster making you feel sick Do not brush your teeth right after you eat Products with real ginger are good for nausea, like ginger ale and ginger hard candy Make sure it says made with real ginger! Sucking on sour candy like lemon heads is also good for nausea If your prenatal vitamins make you nauseated, take them at night so you will sleep through the nausea Sea Bands If you feel like you need medicine for the nausea & vomiting please let us  know If you are unable to keep any fluids or food down please let us  know   Constipation Drink plenty of fluid, preferably water, throughout the day Eat foods high in fiber such as fruits, vegetables, and grains Exercise, such as walking, is a good way to keep your bowels regular Drink warm fluids, especially warm prune juice, or decaf coffee Eat a 1/2 cup of real oatmeal (not instant), 1/2 cup applesauce, and 1/2-1 cup warm prune juice every day If needed, you may take Colace (docusate sodium) stool softener  once or twice a day to help keep the stool soft.  If you still are having problems with constipation, you may take Miralax once daily as needed to help keep your bowels regular.   Home Blood Pressure Monitoring for Patients   Your provider has recommended that you check your blood pressure (BP) at least once a week at home. If you do not have a blood pressure cuff at home, one will be provided for you. Contact your provider if you have not received your monitor within 1 week.   Helpful Tips for Accurate Home Blood Pressure Checks  Don't smoke, exercise, or drink caffeine 30 minutes before checking your BP Use the restroom before checking your BP (a full bladder can raise your pressure) Relax in a comfortable upright chair Feet on the ground Left arm resting comfortably on a flat surface at the level of your heart Legs uncrossed Back supported Sit quietly and don't talk Place the cuff on your bare arm Adjust snuggly, so that only two fingertips can fit between your skin and the top of the cuff Check 2 readings separated by at least one minute Keep a log of your BP readings For a visual, please reference this diagram: http://ccnc.care/bpdiagram  Provider Name: [facility]     Phone: [phone]  Zone 1: ALL CLEAR  Continue to monitor your symptoms:  BP reading is less than 140 (top number) or less than 90 (bottom  number)  No right upper stomach pain No headaches or seeing spots No feeling nauseated or throwing up No swelling in face and hands  Zone 2: CAUTION Call your doctor's office for any of the following:  BP reading is greater than 140 (top number) or greater than 90 (bottom number)  Stomach pain under your ribs in the middle or right side Headaches or seeing spots Feeling nauseated or throwing up Swelling in face and hands  Zone 3: EMERGENCY  Seek immediate medical care if you have any of the following:  BP reading is greater than160 (top number) or greater than  110 (bottom number) Severe headaches not improving with Tylenol  Serious difficulty catching your breath Any worsening symptoms from Zone 2    First Trimester of Pregnancy The first trimester of pregnancy is from week 1 until the end of week 12 (months 1 through 3). A week after a sperm fertilizes an egg, the egg will implant on the wall of the uterus. This embryo will begin to develop into a baby. Genes from you and your partner are forming the baby. The female genes determine whether the baby is a boy or a girl. At 6-8 weeks, the eyes and face are formed, and the heartbeat can be seen on ultrasound. At the end of 12 weeks, all the baby's organs are formed.  Now that you are pregnant, you will want to do everything you can to have a healthy baby. Two of the most important things are to get good prenatal care and to follow your health care provider's instructions. Prenatal care is all the medical care you receive before the baby's birth. This care will help prevent, find, and treat any problems during the pregnancy and childbirth. BODY CHANGES Your body goes through many changes during pregnancy. The changes vary from woman to woman.  You may gain or lose a couple of pounds at first. You may feel sick to your stomach (nauseous) and throw up (vomit). If the vomiting is uncontrollable, call your health care provider. You may tire easily. You may develop headaches that can be relieved by medicines approved by your health care provider. You may urinate more often. Painful urination may mean you have a bladder infection. You may develop heartburn as a result of your pregnancy. You may develop constipation because certain hormones are causing the muscles that push waste through your intestines to slow down. You may develop hemorrhoids or swollen, bulging veins (varicose veins). Your breasts may begin to grow larger and become tender. Your nipples may stick out more, and the tissue that surrounds them  (areola) may become darker. Your gums may bleed and may be sensitive to brushing and flossing. Dark spots or blotches (chloasma, mask of pregnancy) may develop on your face. This will likely fade after the baby is born. Your menstrual periods will stop. You may have a loss of appetite. You may develop cravings for certain kinds of food. You may have changes in your emotions from day to day, such as being excited to be pregnant or being concerned that something may go wrong with the pregnancy and baby. You may have more vivid and strange dreams. You may have changes in your hair. These can include thickening of your hair, rapid growth, and changes in texture. Some women also have hair loss during or after pregnancy, or hair that feels dry or thin. Your hair will most likely return to normal after your baby is born. WHAT TO EXPECT AT YOUR PRENATAL  VISITS During a routine prenatal visit: You will be weighed to make sure you and the baby are growing normally. Your blood pressure will be taken. Your abdomen will be measured to track your baby's growth. The fetal heartbeat will be listened to starting around week 10 or 12 of your pregnancy. Test results from any previous visits will be discussed. Your health care provider may ask you: How you are feeling. If you are feeling the baby move. If you have had any abnormal symptoms, such as leaking fluid, bleeding, severe headaches, or abdominal cramping. If you have any questions. Other tests that may be performed during your first trimester include: Blood tests to find your blood type and to check for the presence of any previous infections. They will also be used to check for low iron levels (anemia) and Rh antibodies. Later in the pregnancy, blood tests for diabetes will be done along with other tests if problems develop. Urine tests to check for infections, diabetes, or protein in the urine. An ultrasound to confirm the proper growth and development  of the baby. An amniocentesis to check for possible genetic problems. Fetal screens for spina bifida and Down syndrome. You may need other tests to make sure you and the baby are doing well. HOME CARE INSTRUCTIONS  Medicines Follow your health care provider's instructions regarding medicine use. Specific medicines may be either safe or unsafe to take during pregnancy. Take your prenatal vitamins as directed. If you develop constipation, try taking a stool softener if your health care provider approves. Diet Eat regular, well-balanced meals. Choose a variety of foods, such as meat or vegetable-based protein, fish, milk and low-fat dairy products, vegetables, fruits, and whole grain breads and cereals. Your health care provider will help you determine the amount of weight gain that is right for you. Avoid raw meat and uncooked cheese. These carry germs that can cause birth defects in the baby. Eating four or five small meals rather than three large meals a day may help relieve nausea and vomiting. If you start to feel nauseous, eating a few soda crackers can be helpful. Drinking liquids between meals instead of during meals also seems to help nausea and vomiting. If you develop constipation, eat more high-fiber foods, such as fresh vegetables or fruit and whole grains. Drink enough fluids to keep your urine clear or pale yellow. Activity and Exercise Exercise only as directed by your health care provider. Exercising will help you: Control your weight. Stay in shape. Be prepared for labor and delivery. Experiencing pain or cramping in the lower abdomen or low back is a good sign that you should stop exercising. Check with your health care provider before continuing normal exercises. Try to avoid standing for long periods of time. Move your legs often if you must stand in one place for a long time. Avoid heavy lifting. Wear low-heeled shoes, and practice good posture. You may continue to have sex  unless your health care provider directs you otherwise. Relief of Pain or Discomfort Wear a good support bra for breast tenderness.   Take warm sitz baths to soothe any pain or discomfort caused by hemorrhoids. Use hemorrhoid cream if your health care provider approves.   Rest with your legs elevated if you have leg cramps or low back pain. If you develop varicose veins in your legs, wear support hose. Elevate your feet for 15 minutes, 3-4 times a day. Limit salt in your diet. Prenatal Care Schedule your prenatal visits by the  twelfth week of pregnancy. They are usually scheduled monthly at first, then more often in the last 2 months before delivery. Write down your questions. Take them to your prenatal visits. Keep all your prenatal visits as directed by your health care provider. Safety Wear your seat belt at all times when driving. Make a list of emergency phone numbers, including numbers for family, friends, the hospital, and police and fire departments. General Tips Ask your health care provider for a referral to a local prenatal education class. Begin classes no later than at the beginning of month 6 of your pregnancy. Ask for help if you have counseling or nutritional needs during pregnancy. Your health care provider can offer advice or refer you to specialists for help with various needs. Do not use hot tubs, steam rooms, or saunas. Do not douche or use tampons or scented sanitary pads. Do not cross your legs for long periods of time. Avoid cat litter boxes and soil used by cats. These carry germs that can cause birth defects in the baby and possibly loss of the fetus by miscarriage or stillbirth. Avoid all smoking, herbs, alcohol, and medicines not prescribed by your health care provider. Chemicals in these affect the formation and growth of the baby. Schedule a dentist appointment. At home, brush your teeth with a soft toothbrush and be gentle when you floss. SEEK MEDICAL CARE IF:   You have dizziness. You have mild pelvic cramps, pelvic pressure, or nagging pain in the abdominal area. You have persistent nausea, vomiting, or diarrhea. You have a bad smelling vaginal discharge. You have pain with urination. You notice increased swelling in your face, hands, legs, or ankles. SEEK IMMEDIATE MEDICAL CARE IF:  You have a fever. You are leaking fluid from your vagina. You have spotting or bleeding from your vagina. You have severe abdominal cramping or pain. You have rapid weight gain or loss. You vomit blood or material that looks like coffee grounds. You are exposed to [place] measles and have never had them. You are exposed to fifth disease or chickenpox. You develop a severe headache. You have shortness of breath. You have any kind of trauma, such as from a fall or a car accident. Document Released: 06/14/2001 Document Revised: 11/04/2013 Document Reviewed: 04/30/2013 [facility] Patient Information 2015 [place], [place]. This information is not intended to replace advice given to you by your health care provider. Make sure you discuss any questions you have with your health care provider.

## 2023-11-24 LAB — CBC/D/PLT+RPR+RH+ABO+RUBIGG...
Antibody Screen: NEGATIVE
Basophils Absolute: 0 10*3/uL (ref 0.0–0.2)
Basos: 0 %
EOS (ABSOLUTE): 0.2 10*3/uL (ref 0.0–0.4)
Eos: 2 %
HCV Ab: NONREACTIVE
HIV Screen 4th Generation wRfx: NONREACTIVE
Hematocrit: 44.1 % (ref 34.0–46.6)
Hemoglobin: 14.6 g/dL (ref 11.1–15.9)
Hepatitis B Surface Ag: NEGATIVE
Immature Grans (Abs): 0.1 10*3/uL (ref 0.0–0.1)
Immature Granulocytes: 1 %
Lymphocytes Absolute: 1.9 10*3/uL (ref 0.7–3.1)
Lymphs: 22 %
MCH: 29.8 pg (ref 26.6–33.0)
MCHC: 33.1 g/dL (ref 31.5–35.7)
MCV: 90 fL (ref 79–97)
Monocytes Absolute: 0.5 10*3/uL (ref 0.1–0.9)
Monocytes: 6 %
Neutrophils Absolute: 6 10*3/uL (ref 1.4–7.0)
Neutrophils: 69 %
Platelets: 241 10*3/uL (ref 150–450)
RBC: 4.9 x10E6/uL (ref 3.77–5.28)
RDW: 13.4 % (ref 11.7–15.4)
RPR Ser Ql: NONREACTIVE
Rh Factor: POSITIVE
Rubella Antibodies, IGG: 1.14 {index} (ref >0.99)
WBC: 8.7 10*3/uL (ref 3.4–10.8)

## 2023-11-24 LAB — INTEGRATED 1
Crown Rump Length: 55.8 mm
Gest. Age on Collection Date: 12 wk
Maternal Age at EDD: 27.2 a
Nuchal Translucency (NT): 1.6 mm
Number of Fetuses: 1
PAPP-A Value: 981.3 ng/mL
Sonographer ID#: 309760
Weight: 202 [lb_av]

## 2023-11-24 LAB — HEMOGLOBIN A1C
Est. average glucose Bld gHb Est-mCnc: 88 mg/dL
Hgb A1c MFr Bld: 4.7 % — ABNORMAL LOW (ref 4.8–5.6)

## 2023-11-24 LAB — HCV INTERPRETATION

## 2023-11-24 LAB — URINE CULTURE

## 2023-11-25 LAB — GC/CHLAMYDIA PROBE AMP
Chlamydia trachomatis, NAA: NEGATIVE
Neisseria Gonorrhoeae by PCR: NEGATIVE

## 2023-11-29 ENCOUNTER — Other Ambulatory Visit: Payer: Self-pay | Admitting: Advanced Practice Midwife

## 2023-11-30 LAB — PANORAMA PRENATAL TEST FULL PANEL:PANORAMA TEST PLUS 5 ADDITIONAL MICRODELETIONS: FETAL FRACTION: 5.7

## 2023-12-02 ENCOUNTER — Ambulatory Visit: Payer: Self-pay | Admitting: Advanced Practice Midwife

## 2023-12-02 ENCOUNTER — Encounter: Payer: Self-pay | Admitting: Advanced Practice Midwife

## 2023-12-02 DIAGNOSIS — R8271 Bacteriuria: Secondary | ICD-10-CM | POA: Insufficient documentation

## 2023-12-02 LAB — HORIZON CUSTOM: REPORT SUMMARY: NEGATIVE

## 2023-12-06 ENCOUNTER — Encounter: Payer: Self-pay | Admitting: Women's Health

## 2023-12-06 ENCOUNTER — Ambulatory Visit: Admitting: Women's Health

## 2023-12-06 VITALS — BP 126/79 | HR 118 | Wt 204.0 lb

## 2023-12-06 DIAGNOSIS — Z3A14 14 weeks gestation of pregnancy: Secondary | ICD-10-CM | POA: Diagnosis not present

## 2023-12-06 DIAGNOSIS — Z3482 Encounter for supervision of other normal pregnancy, second trimester: Secondary | ICD-10-CM

## 2023-12-06 DIAGNOSIS — O09292 Supervision of pregnancy with other poor reproductive or obstetric history, second trimester: Secondary | ICD-10-CM

## 2023-12-06 DIAGNOSIS — N96 Recurrent pregnancy loss: Secondary | ICD-10-CM | POA: Diagnosis not present

## 2023-12-06 MED ORDER — PANTOPRAZOLE SODIUM 20 MG PO TBEC: 20.0000 mg | DELAYED_RELEASE_TABLET | Freq: Every day | ORAL | 6 refills | Status: AC

## 2023-12-06 NOTE — Progress Notes (Signed)
 LOW-RISK PREGNANCY VISIT Patient name: Katherine Mora MRN [id number]  Date of birth: 1995/09/10 Chief Complaint:   Routine Prenatal Visit  History of Present Illness:   Katherine Mora is a 28 y.o. G10P0090 female at [redacted]w[redacted]d with an Estimated Date of Delivery: 06/01/24 being seen today for ongoing management of a low-risk pregnancy.   Today she reports needs work note w/ multiple things written on it (has note)- note given. Alvah Auerbach wanted her to have progesterone  redrawn but she lost paper, wants to do today. Contractions: Not present. Vag. Bleeding: None.  Movement: Absent. denies leaking of fluid.     11/22/2023   10:40 AM 07/24/2023   11:45 AM 06/29/2023    1:35 PM 01/13/2023   10:36 AM 12/30/2022   11:29 AM  Depression screen PHQ 2/9  Decreased Interest 0 0 0 0   Down, Depressed, Hopeless 0 0 0 0   PHQ - 2 Score 0 0 0 0   Altered sleeping 2 2 0 1   Tired, decreased energy 1 2 0 1   Change in appetite 0 0 0 1   Feeling bad or failure about yourself  0 0 0 1   Trouble concentrating 2 0 0 0   Moving slowly or fidgety/restless 0 0 0 0   Suicidal thoughts 0 0 0 0   PHQ-9 Score 5 4 0 4   Difficult doing work/chores   Not difficult at all  Somewhat difficult        11/22/2023   10:40 AM 07/24/2023   11:45 AM 06/29/2023    1:36 PM 01/13/2023   10:38 AM  GAD 7 : Generalized Anxiety Score  Nervous, Anxious, on Edge 2 0 0 1  Control/stop worrying 2 0 0 0  Worry too much - different things 1 1 0 0  Trouble relaxing 0 0 0 1  Restless 0 0 0 0  Easily annoyed or irritable 2 0 0 2  Afraid - awful might happen 0 0 0 0  Total GAD 7 Score 7 1 0 4  Anxiety Difficulty    Somewhat difficult      Review of Systems:   Pertinent items are noted in HPI Denies abnormal vaginal discharge w/ itching/odor/irritation, headaches, visual changes, shortness of breath, chest pain, abdominal pain, severe nausea/vomiting, or problems with urination or bowel movements unless otherwise stated  above. Pertinent History Reviewed:  Reviewed past medical,surgical, social, obstetrical and family history.  Reviewed problem list, medications and allergies. Physical Assessment:   Vitals:   12/06/23 1120  BP: 126/79  Pulse: (!) 118  Weight: 204 lb (92.5 kg)  Body mass index is 35.02 kg/m.        Physical Examination:   General appearance: Well appearing, and in no distress  Mental status: Alert, oriented to person, place, and time  Skin: Warm & dry  Cardiovascular: Normal heart rate noted  Respiratory: Normal respiratory effort, no distress  Abdomen: Soft, gravid, nontender  Pelvic: Cervical exam deferred         Extremities: Edema: None  Fetal Status: Fetal Heart Rate (bpm): 153   Movement: Absent    Chaperone: N/A No results found for this or any previous visit (from the past 24 hours).  Assessment & Plan:  1) Low-risk pregnancy G10P0090 at [redacted]w[redacted]d with an Estimated Date of Delivery: 06/01/24   2) H/o multiple SABs (9), good FHT today, Alvah Auerbach wanted her to have progesterone  redrawn but she lost paper, wants to do today.  Discussed we wouldn't do anything different even if low, already on prometrium    Meds:  Meds ordered this encounter  Medications   pantoprazole (PROTONIX) 20 MG tablet    Sig: Take 1 tablet (20 mg total) by mouth daily.    Dispense:  30 tablet    Refill:  6   Labs/procedures today: progesterone   Plan:  Continue routine obstetrical care  Next visit: prefers will be in person for 2nd IT    Reviewed: Preterm labor symptoms and general obstetric precautions including but not limited to vaginal bleeding, contractions, leaking of fluid and fetal movement were reviewed in detail with the patient.  All questions were answered. Does have home bp cuff. Office bp cuff given: not applicable. Check bp weekly, let us  know if consistently >140 and/or >90.  Follow-up: Return for As scheduled; please print note.  Future Appointments  Date Time Provider Department  Center  12/20/2023 10:10 AM Ferd Householder, CNM [facility]  12/28/2023  1:20 PM Del Abron Abt, FNP [facility]  01/10/2024 10:00 AM [facility]  [facility] None  01/10/2024 10:50 AM Ferd Householder, CNM [facility]  01/31/2024  2:15 PM McCue, Camilo Cella, NP [facility] None    No orders of the defined types were placed in this encounter.  Ferd Householder CNM, [facility] 12/06/2023 11:35 AM

## 2023-12-06 NOTE — Patient Instructions (Signed)
 [place], thank you for choosing our office today! We appreciate the opportunity to meet your healthcare needs. You may receive a short survey by mail, e-mail, or through [employer]. If you are happy with your care we would appreciate if you could take just a few minutes to complete the survey questions. We read all of your comments and take your feedback very seriously. Thank you again for choosing our office.  Center for [employer] Team at [facility] at [facility] ([address] [place], [place] [address]) Entrance C, located off of [address] Free 24/7 valet parking  Go to [email or web address] to register for FREE online childbirth classes  Call the office [phone]) or go to [facility] if: You begin to severe cramping Your water breaks.  Sometimes it is a big gush of fluid, sometimes it is just a trickle that keeps getting your panties wet or running down your legs You have vaginal bleeding.  It is normal to have a small amount of spotting if your cervix was checked.   [facility] Pediatricians/Family Doctors [facility]): [address] Meg Spina, [phone]           [facility]: [address], [phone]                [facility]): [address], [phone] (call to ask if accepting patients) [facility] Department: [address], [place], [phone]    [facility] Pediatricians/Family Doctors [facility]): [phone] [address], [phone] [facility]: [address] [place], [phone] [facility]: [address], [phone]  [facility] Doctors  [facility]): [phone] [facility]: [address], [phone]   [facility] Doctors [facility]: [address], [phone]  [facility] Doctors  [facility]: [phone], [phone]  Home Blood Pressure Monitoring for Patients   Your provider has recommended that you check your blood pressure (BP) at least once a week at home. If you do not have a blood pressure cuff at home, one will be provided for you. Contact your provider if you have not received your monitor within 1 week.   Helpful Tips for Accurate Home Blood Pressure Checks  Don't smoke, exercise, or drink caffeine 30 minutes before checking your BP Use the restroom before checking your BP (a full bladder can raise your pressure) Relax in a comfortable upright chair Feet on the ground Left arm resting comfortably on a flat surface at the level of your heart Legs uncrossed Back supported Sit quietly and don't talk Place the cuff on your bare arm Adjust snuggly, so that only two fingertips can fit between your skin and the top of the cuff Check 2 readings separated by at least one minute Keep a log of your BP readings For a visual, please reference this diagram: http://ccnc.care/bpdiagram  Provider Name: [facility]     Phone: [phone]  Zone 1: ALL CLEAR  Continue to monitor your symptoms:  BP reading is less than 140 (top number) or less than 90 (bottom number)  No right upper stomach pain No headaches or seeing spots No feeling nauseated or throwing up No swelling in face and hands  Zone 2: CAUTION Call your doctor's office for any of the following:  BP reading is greater than 140 (top number) or greater than  90 (bottom number)  Stomach pain under your ribs in the middle or right side Headaches or seeing spots Feeling nauseated or throwing up Swelling in face and hands  Zone 3: EMERGENCY  Seek immediate medical care if you have any of the following:  BP reading is greater than160 (top number) or greater than 110 (bottom number) Severe headaches not improving with Tylenol  Serious difficulty catching your breath Any worsening symptoms from  Zone 2     Second Trimester of Pregnancy The second trimester is from week 14 through week 27 (months 4 through 6). The second trimester is often a time when you feel your best. Your body has adjusted to being pregnant, and you begin to feel better physically. Usually, morning sickness has lessened or quit completely, you may have more energy, and you may have an increase in appetite. The second trimester is also a time when the fetus is growing rapidly. At the end of the sixth month, the fetus is about 9 inches long and weighs about 1 pounds. You will likely begin to feel the baby move (quickening) between 16 and 20 weeks of pregnancy. Body changes during your second trimester Your body continues to go through many changes during your second trimester. The changes vary from woman to woman. Your weight will continue to increase. You will notice your lower abdomen bulging out. You may begin to get stretch marks on your hips, abdomen, and breasts. You may develop headaches that can be relieved by medicines. The medicines should be approved by your health care provider. You may urinate more often because the fetus is pressing on your bladder. You may develop or continue to have heartburn as a result of your pregnancy. You may develop constipation because certain hormones are causing the muscles that push waste through your intestines to slow down. You may develop hemorrhoids or swollen, bulging veins (varicose veins). You may have back pain. This is caused by: Weight gain. Pregnancy hormones that are relaxing the joints in your pelvis. A shift in weight and the muscles that support your balance. Your breasts will continue to grow and they will continue to become tender. Your gums may bleed and may be sensitive to brushing and flossing. Dark spots or blotches (chloasma, mask of pregnancy) may develop on your face. This will likely fade after the baby is born. A dark line from your belly button to  the pubic area (linea nigra) may appear. This will likely fade after the baby is born. You may have changes in your hair. These can include thickening of your hair, rapid growth, and changes in texture. Some women also have hair loss during or after pregnancy, or hair that feels dry or thin. Your hair will most likely return to normal after your baby is born.  What to expect at prenatal visits During a routine prenatal visit: You will be weighed to make sure you and the fetus are growing normally. Your blood pressure will be taken. Your abdomen will be measured to track your baby's growth. The fetal heartbeat will be listened to. Any test results from the previous visit will be discussed.  Your health care provider may ask you: How you are feeling. If you are feeling the baby move. If you have had any abnormal symptoms, such as leaking fluid, bleeding, severe headaches, or abdominal cramping. If you are using any tobacco products, including cigarettes, chewing tobacco, and electronic cigarettes. If you have any questions.  Other tests that may be performed during  your second trimester include: Blood tests that check for: Low iron levels (anemia). High blood sugar that affects pregnant women (gestational diabetes) between 42 and 28 weeks. Rh antibodies. This is to check for a protein on red blood cells (Rh factor). Urine tests to check for infections, diabetes, or protein in the urine. An ultrasound to confirm the proper growth and development of the baby. An amniocentesis to check for possible genetic problems. Fetal screens for spina bifida and Down syndrome. HIV (human immunodeficiency virus) testing. Routine prenatal testing includes screening for HIV, unless you choose not to have this test.  Follow these instructions at home: Medicines Follow your health care provider's instructions regarding medicine use. Specific medicines may be either safe or unsafe to take during  pregnancy. Take a prenatal vitamin that contains at least 600 micrograms (mcg) of folic acid . If you develop constipation, try taking a stool softener if your health care provider approves. Eating and drinking Eat a balanced diet that includes fresh fruits and vegetables, whole grains, good sources of protein such as meat, eggs, or tofu, and low-fat dairy. Your health care provider will help you determine the amount of weight gain that is right for you. Avoid raw meat and uncooked cheese. These carry germs that can cause birth defects in the baby. If you have low calcium intake from food, talk to your health care provider about whether you should take a daily calcium supplement. Limit foods that are high in fat and processed sugars, such as fried and sweet foods. To prevent constipation: Drink enough fluid to keep your urine clear or pale yellow. Eat foods that are high in fiber, such as fresh fruits and vegetables, whole grains, and beans. Activity Exercise only as directed by your health care provider. Most women can continue their usual exercise routine during pregnancy. Try to exercise for 30 minutes at least 5 days a week. Stop exercising if you experience uterine contractions. Avoid heavy lifting, wear low heel shoes, and practice good posture. A sexual relationship may be continued unless your health care provider directs you otherwise. Relieving pain and discomfort Wear a good support bra to prevent discomfort from breast tenderness. Take warm sitz baths to soothe any pain or discomfort caused by hemorrhoids. Use hemorrhoid cream if your health care provider approves. Rest with your legs elevated if you have leg cramps or low back pain. If you develop varicose veins, wear support hose. Elevate your feet for 15 minutes, 3-4 times a day. Limit salt in your diet. Prenatal Care Write down your questions. Take them to your prenatal visits. Keep all your prenatal visits as told by your health  care provider. This is important. Safety Wear your seat belt at all times when driving. Make a list of emergency phone numbers, including numbers for family, friends, the hospital, and police and fire departments. General instructions Ask your health care provider for a referral to a local prenatal education class. Begin classes no later than the beginning of month 6 of your pregnancy. Ask for help if you have counseling or nutritional needs during pregnancy. Your health care provider can offer advice or refer you to specialists for help with various needs. Do not use hot tubs, steam rooms, or saunas. Do not douche or use tampons or scented sanitary pads. Do not cross your legs for long periods of time. Avoid cat litter boxes and soil used by cats. These carry germs that can cause birth defects in the baby and possibly loss of the  fetus by miscarriage or stillbirth. Avoid all smoking, herbs, alcohol, and unprescribed drugs. Chemicals in these products can affect the formation and growth of the baby. Do not use any products that contain nicotine  or tobacco, such as cigarettes and e-cigarettes. If you need help quitting, ask your health care provider. Visit your dentist if you have not gone yet during your pregnancy. Use a soft toothbrush to brush your teeth and be gentle when you floss. Contact a health care provider if: You have dizziness. You have mild pelvic cramps, pelvic pressure, or nagging pain in the abdominal area. You have persistent nausea, vomiting, or diarrhea. You have a bad smelling vaginal discharge. You have pain when you urinate. Get help right away if: You have a fever. You are leaking fluid from your vagina. You have spotting or bleeding from your vagina. You have severe abdominal cramping or pain. You have rapid weight gain or weight loss. You have shortness of breath with chest pain. You notice sudden or extreme swelling of your face, hands, ankles, feet, or legs. You  have not felt your baby move in over an hour. You have severe headaches that do not go away when you take medicine. You have vision changes. Summary The second trimester is from week 14 through week 27 (months 4 through 6). It is also a time when the fetus is growing rapidly. Your body goes through many changes during pregnancy. The changes vary from woman to woman. Avoid all smoking, herbs, alcohol, and unprescribed drugs. These chemicals affect the formation and growth your baby. Do not use any tobacco products, such as cigarettes, chewing tobacco, and e-cigarettes. If you need help quitting, ask your health care provider. Contact your health care provider if you have any questions. Keep all prenatal visits as told by your health care provider. This is important. This information is not intended to replace advice given to you by your health care provider. Make sure you discuss any questions you have with your health care provider. Document Released: 06/14/2001 Document Revised: 11/26/2015 Document Reviewed: 08/21/2012 Elsevier Interactive Patient Education  2017 [employer].

## 2023-12-07 ENCOUNTER — Other Ambulatory Visit: Payer: Self-pay | Admitting: Women's Health

## 2023-12-07 LAB — PROGESTERONE: Progesterone: 24.4 ng/mL

## 2023-12-11 ENCOUNTER — Encounter: Payer: Self-pay | Admitting: Obstetrics & Gynecology

## 2023-12-20 ENCOUNTER — Ambulatory Visit: Admitting: Obstetrics & Gynecology

## 2023-12-20 ENCOUNTER — Other Ambulatory Visit: Payer: Self-pay | Admitting: Adult Health

## 2023-12-20 VITALS — BP 111/69 | HR 77 | Wt 202.0 lb

## 2023-12-20 DIAGNOSIS — O2622 Pregnancy care for patient with recurrent pregnancy loss, second trimester: Secondary | ICD-10-CM | POA: Diagnosis not present

## 2023-12-20 DIAGNOSIS — Z3492 Encounter for supervision of normal pregnancy, unspecified, second trimester: Secondary | ICD-10-CM | POA: Diagnosis not present

## 2023-12-20 DIAGNOSIS — Z3A16 16 weeks gestation of pregnancy: Secondary | ICD-10-CM

## 2023-12-20 DIAGNOSIS — Z3482 Encounter for supervision of other normal pregnancy, second trimester: Secondary | ICD-10-CM

## 2023-12-20 DIAGNOSIS — N96 Recurrent pregnancy loss: Secondary | ICD-10-CM

## 2023-12-20 NOTE — Progress Notes (Signed)
 LOW-RISK PREGNANCY VISIT Patient name: Katherine Mora MRN [id number]  Date of birth: 10/25/1995 Chief Complaint:   Routine Prenatal Visit  History of Present Illness:   Katherine Mora is a 28 y.o. G10P0090 female at [redacted]w[redacted]d with an Estimated Date of Delivery: 06/01/24 being seen today for ongoing management of a low-risk pregnancy.   -h/o recurrent pregnancy loss, on progesterone  supplementation and ASA -GBS bacteruria -desires waterbirth- tried to sign up, but had issues online     11/22/2023   10:40 AM 07/24/2023   11:45 AM 06/29/2023    1:35 PM 01/13/2023   10:36 AM 12/30/2022   11:29 AM  Depression screen PHQ 2/9  Decreased Interest 0 0 0 0   Down, Depressed, Hopeless 0 0 0 0   PHQ - 2 Score 0 0 0 0   Altered sleeping 2 2 0 1   Tired, decreased energy 1 2 0 1   Change in appetite 0 0 0 1   Feeling bad or failure about yourself  0 0 0 1   Trouble concentrating 2 0 0 0   Moving slowly or fidgety/restless 0 0 0 0   Suicidal thoughts 0 0 0 0   PHQ-9 Score 5 4 0 4   Difficult doing work/chores   Not difficult at all  Somewhat difficult    Today she reports no complaints. Contractions: Not present. She had 2 days of spotting, but has since resolved.  Movement: Present. denies leaking of fluid. Review of Systems:   Pertinent items are noted in HPI Denies abnormal vaginal discharge w/ itching/odor/irritation, headaches, visual changes, shortness of breath, chest pain, abdominal pain, severe nausea/vomiting, or problems with urination or bowel movements unless otherwise stated above. Pertinent History Reviewed:  Reviewed past medical,surgical, social, obstetrical and family history.  Reviewed problem list, medications and allergies.  Physical Assessment:   Vitals:   12/20/23 1154  BP: 111/69  Pulse: 77  Weight: 202 lb (91.6 kg)  Body mass index is 34.67 kg/m.        Physical Examination:   General appearance: Well appearing, and in no distress  Mental status: Alert,  oriented to person, place, and time  Skin: Warm & dry  Respiratory: Normal respiratory effort, no distress  Abdomen: Soft, gravid, nontender  Pelvic: Cervical exam deferred         Extremities:  no edema  Psych:  mood and affect appropriate  Fetal Status: Fetal Heart Rate (bpm): 155   Movement: Present    Chaperone: n/a    No results found for this or any previous visit (from the past 24 hours).   Assessment & Plan:  1) Low-risk pregnancy G10P0090 at [redacted]w[redacted]d with an Estimated Date of Delivery: 06/01/24   2) h/o RPL -continue bASA and progesterone , may discontinue P around 24wks if desires  3) GBS bacteuria   Meds: No orders of the defined types were placed in this encounter.  Labs/procedures today: IT2 today  Plan:  Continue routine obstetrical care  []  discuss waterbirth info with Alvah Auerbach next visit Next visit: prefers in person    Reviewed: Preterm labor symptoms and general obstetric precautions including but not limited to vaginal bleeding, contractions, leaking of fluid and fetal movement were reviewed in detail with the patient.  All questions were answered. Pt has home bp cuff. Check bp weekly, let us  know if >140/90.   Follow-up: Return in about 4 weeks (around 01/17/2024) for anatomy scan and LROB (already scheduled).  Orders Placed This Encounter  Procedures   INTEGRATED 2    Joyelle Siedlecki, DO Attending Obstetrician & Gynecologist, [facility] for [employer], [facility]

## 2023-12-22 LAB — INTEGRATED 2
AFP MoM: 1.49
Alpha-Fetoprotein: 37.3 ng/mL
Crown Rump Length: 55.8 mm
DIA MoM: 1.61
DIA Value: 218.8 pg/mL
Estriol, Unconjugated: 1.22 ng/mL
Gest. Age on Collection Date: 12 wk
Gestational Age: 16 wk
Maternal Age at EDD: 27.2 a
Nuchal Translucency (NT): 1.6 mm
Nuchal Translucency MoM: 1.09
Number of Fetuses: 1
PAPP-A MoM: 2.09
PAPP-A Value: 981.3 ng/mL
Sonographer ID#: 309760
Test Results:: NEGATIVE
Weight: 202 [lb_av]
Weight: 202 [lb_av]
hCG MoM: 0.92
hCG Value: 28.4 [IU]/mL
uE3 MoM: 1.29

## 2023-12-25 ENCOUNTER — Ambulatory Visit: Payer: Self-pay | Admitting: Obstetrics & Gynecology

## 2023-12-25 DIAGNOSIS — Z3480 Encounter for supervision of other normal pregnancy, unspecified trimester: Secondary | ICD-10-CM

## 2023-12-27 DIAGNOSIS — F3130 Bipolar disorder, current episode depressed, mild or moderate severity, unspecified: Secondary | ICD-10-CM | POA: Diagnosis not present

## 2023-12-28 ENCOUNTER — Ambulatory Visit: Admitting: Family Medicine

## 2023-12-28 ENCOUNTER — Ambulatory Visit: Payer: Medicaid Other | Admitting: Internal Medicine

## 2024-01-10 ENCOUNTER — Ambulatory Visit: Admitting: Advanced Practice Midwife

## 2024-01-10 ENCOUNTER — Ambulatory Visit

## 2024-01-10 ENCOUNTER — Encounter: Payer: Self-pay | Admitting: Advanced Practice Midwife

## 2024-01-10 VITALS — BP 136/65 | HR 76 | Wt 207.0 lb

## 2024-01-10 DIAGNOSIS — Z3A19 19 weeks gestation of pregnancy: Secondary | ICD-10-CM

## 2024-01-10 DIAGNOSIS — O0992 Supervision of high risk pregnancy, unspecified, second trimester: Secondary | ICD-10-CM

## 2024-01-10 DIAGNOSIS — Z363 Encounter for antenatal screening for malformations: Secondary | ICD-10-CM | POA: Diagnosis not present

## 2024-01-10 DIAGNOSIS — Q270 Congenital absence and hypoplasia of umbilical artery: Secondary | ICD-10-CM | POA: Insufficient documentation

## 2024-01-10 DIAGNOSIS — O0990 Supervision of high risk pregnancy, unspecified, unspecified trimester: Secondary | ICD-10-CM

## 2024-01-10 DIAGNOSIS — R8271 Bacteriuria: Secondary | ICD-10-CM

## 2024-01-10 DIAGNOSIS — Z3480 Encounter for supervision of other normal pregnancy, unspecified trimester: Secondary | ICD-10-CM

## 2024-01-10 MED ORDER — COMFORT FIT MATERNITY SUPP LG MISC: 1.0000 | Freq: Every day | 0 refills | Status: DC

## 2024-01-10 NOTE — Patient Instructions (Signed)
 Katherine Mora, thank you for choosing our office today! We appreciate the opportunity to meet your healthcare needs. You may receive a short survey by mail, e-mail, or through [employer]. If you are happy with your care we would appreciate if you could take just a few minutes to complete the survey questions. We read all of your comments and take your feedback very seriously. Thank you again for choosing our office.  Center for [employer] Team at [facility]   Options for Doula Care in the [place]  As you review your birthing options, consider having a birth doula. A doula is trained to provide support before, during and just after you give birth. There are also postpartum doulas that help you adjust to new parenthood.  While doulas do not provide medical care, they do provide emotional, physical and educational support. A few months before your baby arrives, doulas can help answer questions, ease concerns and help you create and support your birthing plan.    Doulas can help reduce your stress and comfort you and your partner. They can help you cope with labor by helping you use breathing techniques, massage, creative labor positioning, essential oils and affirmations.   Studies show that the benefits of having a doula include:   A more positive birth experience  Fewer requests for pain-relief medication  Less likelihood of cesarean section, commonly called a c-section   Doulas are typically hired via a [employer] account planner between you and the doula. We are happy to provide a list of the most active doulas in the area, all of whom are credentialed by [facility] and will not count as a visitor at your birth.  There are several options for no-cost doula care at our hospital, including:  [facility] Volunteer Doula Program Every [employer] Program A Cure 4 Moms Doula Study (available only at [employer] for Women, [place], [place] and [employer] [facility] offices)  For more information on these programs  or to receive a list of doulas active in our area, please email [email or web address]         [facility] ([address] [place], [place] [address]) Entrance C, located off of [address] parking  Go to [email or web address] to register for FREE online childbirth classes  Call the office [phone]) or go to [facility] if: You begin to severe cramping Your water breaks.  Sometimes it is a big gush of fluid, sometimes it is just a trickle that keeps getting your panties wet or running down your legs You have vaginal bleeding.  It is normal to have a small amount of spotting if your cervix was checked.   [facility] Pediatricians/Family Doctors [facility]): [address] [place], [phone]           [facility]: [address], [phone]                [facility]): [address], [phone] (call to ask if accepting patients) [facility] Department: [address], [place], [phone]    [facility] Pediatricians/Family Doctors [facility]): [phone] [address], [phone] [facility]: [address] [place], [phone] [facility]: [address], [phone]  [facility] Doctors  [facility]): [phone] [facility]: [address] [phone]   [facility] Doctors [facility]: [address]  [address] [phone]  [facility] Doctors  [facility]: [phone], [phone]  Home Blood Pressure Monitoring for Patients   Your provider has recommended that you check your blood pressure (BP) at least once a week at home. If you do not have a blood pressure cuff at home, one will be provided for you. Contact your provider if you have not received your monitor within 1 week.    Helpful Tips for Accurate Home Blood Pressure Checks  Don't smoke, exercise, or drink caffeine 30 minutes before checking your BP Use the restroom before checking your BP (a full bladder can raise your pressure) Relax in a comfortable upright chair Feet on the ground Left arm resting comfortably on a flat surface at the level of your heart Legs uncrossed Back supported Sit quietly and don't talk Place the cuff on your bare arm Adjust snuggly, so that only two fingertips can fit between your skin and the top of the cuff Check 2 readings separated by at least one minute Keep a log of your BP readings For a visual, please reference this diagram: http://ccnc.care/bpdiagram  Provider Name: [facility]     Phone: [phone]  Zone 1: ALL CLEAR  Continue to monitor your symptoms:  BP reading is less than 140 (top number) or less than 90 (bottom number)  No right upper stomach pain No headaches or seeing spots No feeling nauseated or throwing up No swelling in face and hands  Zone 2: CAUTION Call your doctor's office for any of the following:  BP reading is greater than 140 (top number) or greater than 90 (bottom number)  Stomach pain under your ribs in the middle or right side Headaches or seeing spots Feeling nauseated or throwing up Swelling in face and hands  Zone 3: EMERGENCY  Seek immediate medical care if you have any of the following:  BP reading is greater than160 (top number) or greater than 110 (bottom number) Severe headaches not improving with Tylenol  Serious difficulty catching your breath Any worsening symptoms from Zone 2     Second Trimester of Pregnancy The second trimester is from week 14 through week 27 (months 4 through 6). The second trimester is often a time when you feel your best. Your body has adjusted to being pregnant, and you begin to feel better physically. Usually, morning sickness has lessened or quit completely, you may have more energy,  and you may have an increase in appetite. The second trimester is also a time when the fetus is growing rapidly. At the end of the sixth month, the fetus is about 9 inches long and weighs about 1 pounds. You will likely begin to feel the baby move (quickening) between 16 and 20 weeks of pregnancy. Body changes during your second trimester Your body continues to go through many changes during your second trimester. The changes vary from woman to woman. Your weight will continue to increase. You will notice your lower abdomen bulging out. You may begin to get stretch marks on your hips, abdomen, and breasts. You may develop headaches that can be relieved by medicines. The medicines should be approved by your health care provider. You may urinate more often because the fetus is pressing on your bladder. You may develop or continue to have heartburn as a result of your pregnancy. You may develop constipation because certain hormones are causing the muscles that push waste through your intestines to slow down. You may develop hemorrhoids or swollen, bulging  veins (varicose veins). You may have back pain. This is caused by: Weight gain. Pregnancy hormones that are relaxing the joints in your pelvis. A shift in weight and the muscles that support your balance. Your breasts will continue to grow and they will continue to become tender. Your gums may bleed and may be sensitive to brushing and flossing. Dark spots or blotches (chloasma, mask of pregnancy) may develop on your face. This will likely fade after the baby is born. A dark line from your belly button to the pubic area (linea nigra) may appear. This will likely fade after the baby is born. You may have changes in your hair. These can include thickening of your hair, rapid growth, and changes in texture. Some women also have hair loss during or after pregnancy, or hair that feels dry or thin. Your hair will most likely return to normal after your  baby is born.  What to expect at prenatal visits During a routine prenatal visit: You will be weighed to make sure you and the fetus are growing normally. Your blood pressure will be taken. Your abdomen will be measured to track your baby's growth. The fetal heartbeat will be listened to. Any test results from the previous visit will be discussed.  Your health care provider may ask you: How you are feeling. If you are feeling the baby move. If you have had any abnormal symptoms, such as leaking fluid, bleeding, severe headaches, or abdominal cramping. If you are using any tobacco products, including cigarettes, chewing tobacco, and electronic cigarettes. If you have any questions.  Other tests that may be performed during your second trimester include: Blood tests that check for: Low iron levels (anemia). High blood sugar that affects pregnant women (gestational diabetes) between 34 and 28 weeks. Rh antibodies. This is to check for a protein on red blood cells (Rh factor). Urine tests to check for infections, diabetes, or protein in the urine. An ultrasound to confirm the proper growth and development of the baby. An amniocentesis to check for possible genetic problems. Fetal screens for spina bifida and Down syndrome. HIV (human immunodeficiency virus) testing. Routine prenatal testing includes screening for HIV, unless you choose not to have this test.  Follow these instructions at home: Medicines Follow your health care provider's instructions regarding medicine use. Specific medicines may be either safe or unsafe to take during pregnancy. Take a prenatal vitamin that contains at least 600 micrograms (mcg) of folic acid . If you develop constipation, try taking a stool softener if your health care provider approves. Eating and drinking Eat a balanced diet that includes fresh fruits and vegetables, whole grains, good sources of protein such as meat, eggs, or tofu, and low-fat  dairy. Your health care provider will help you determine the amount of weight gain that is right for you. Avoid raw meat and uncooked cheese. These carry germs that can cause birth defects in the baby. If you have low calcium intake from food, talk to your health care provider about whether you should take a daily calcium supplement. Limit foods that are high in fat and processed sugars, such as fried and sweet foods. To prevent constipation: Drink enough fluid to keep your urine clear or pale yellow. Eat foods that are high in fiber, such as fresh fruits and vegetables, whole grains, and beans. Activity Exercise only as directed by your health care provider. Most women can continue their usual exercise routine during pregnancy. Try to exercise for 30 minutes at least  5 days a week. Stop exercising if you experience uterine contractions. Avoid heavy lifting, wear low heel shoes, and practice good posture. A sexual relationship may be continued unless your health care provider directs you otherwise. Relieving pain and discomfort Wear a good support bra to prevent discomfort from breast tenderness. Take warm sitz baths to soothe any pain or discomfort caused by hemorrhoids. Use hemorrhoid cream if your health care provider approves. Rest with your legs elevated if you have leg cramps or low back pain. If you develop varicose veins, wear support hose. Elevate your feet for 15 minutes, 3-4 times a day. Limit salt in your diet. Prenatal Care Write down your questions. Take them to your prenatal visits. Keep all your prenatal visits as told by your health care provider. This is important. Safety Wear your seat belt at all times when driving. Make a list of emergency phone numbers, including numbers for family, friends, the hospital, and police and fire departments. General instructions Ask your health care provider for a referral to a local prenatal education class. Begin classes no later than the  beginning of month 6 of your pregnancy. Ask for help if you have counseling or nutritional needs during pregnancy. Your health care provider can offer advice or refer you to specialists for help with various needs. Do not use hot tubs, steam rooms, or saunas. Do not douche or use tampons or scented sanitary pads. Do not cross your legs for long periods of time. Avoid cat litter boxes and soil used by cats. These carry germs that can cause birth defects in the baby and possibly loss of the fetus by miscarriage or stillbirth. Avoid all smoking, herbs, alcohol, and unprescribed drugs. Chemicals in these products can affect the formation and growth of the baby. Do not use any products that contain nicotine  or tobacco, such as cigarettes and e-cigarettes. If you need help quitting, ask your health care provider. Visit your dentist if you have not gone yet during your pregnancy. Use a soft toothbrush to brush your teeth and be gentle when you floss. Contact a health care provider if: You have dizziness. You have mild pelvic cramps, pelvic pressure, or nagging pain in the abdominal area. You have persistent nausea, vomiting, or diarrhea. You have a bad smelling vaginal discharge. You have pain when you urinate. Get help right away if: You have a fever. You are leaking fluid from your vagina. You have spotting or bleeding from your vagina. You have severe abdominal cramping or pain. You have rapid weight gain or weight loss. You have shortness of breath with chest pain. You notice sudden or extreme swelling of your face, hands, ankles, feet, or legs. You have not felt your baby move in over an hour. You have severe headaches that do not go away when you take medicine. You have vision changes. Summary The second trimester is from week 14 through week 27 (months 4 through 6). It is also a time when the fetus is growing rapidly. Your body goes through many changes during pregnancy. The changes vary  from woman to woman. Avoid all smoking, herbs, alcohol, and unprescribed drugs. These chemicals affect the formation and growth your baby. Do not use any tobacco products, such as cigarettes, chewing tobacco, and e-cigarettes. If you need help quitting, ask your health care provider. Contact your health care provider if you have any questions. Keep all prenatal visits as told by your health care provider. This is important. This information is not intended to  replace advice given to you by your health care provider. Make sure you discuss any questions you have with your health care provider. Document Released: 06/14/2001 Document Revised: 11/26/2015 Document Reviewed: 08/21/2012 Elsevier Interactive Patient Education  2017 [employer].

## 2024-01-10 NOTE — Progress Notes (Signed)
 US  19+4 wks,breech,fundal placenta gr 0,normal ovaries,CX 4 cm,SVP of fluid 4 cm,SUA,LVEICF,right simple choroid plexus cyst 6 x 4 mm,FHR 164 bpm,EFW 273 g 21%,anatomy complete

## 2024-01-10 NOTE — Progress Notes (Signed)
 HIGH-RISK PREGNANCY VISIT Patient name: Katherine Mora MRN [id number]  Date of birth: 01/08/1996 Chief Complaint:   Routine Prenatal Visit  History of Present Illness:   Katherine Mora is a 28 y.o. G10P0090 female at [redacted]w[redacted]d with an Estimated Date of Delivery: 06/01/24 being seen today for ongoing management of a high-risk pregnancy complicated by single umbilical artery.    Today she reports difficulty w lifting >15lbs at work; wanting to be present during son's circ; having some generalized abd/back discomfort. Contractions: Not present. Vag. Bleeding: None.  Movement: Present. denies leaking of fluid.      11/22/2023   10:40 AM 07/24/2023   11:45 AM 06/29/2023    1:35 PM 01/13/2023   10:36 AM 12/30/2022   11:29 AM  Depression screen PHQ 2/9  Decreased Interest 0 0 0 0   Down, Depressed, Hopeless 0 0 0 0   PHQ - 2 Score 0 0 0 0   Altered sleeping 2 2 0 1   Tired, decreased energy 1 2 0 1   Change in appetite 0 0 0 1   Feeling bad or failure about yourself  0 0 0 1   Trouble concentrating 2 0 0 0   Moving slowly or fidgety/restless 0 0 0 0   Suicidal thoughts 0 0 0 0   PHQ-9 Score 5 4 0 4   Difficult doing work/chores   Not difficult at all  Somewhat difficult        11/22/2023   10:40 AM 07/24/2023   11:45 AM 06/29/2023    1:36 PM 01/13/2023   10:38 AM  GAD 7 : Generalized Anxiety Score  Nervous, Anxious, on Edge 2 0 0 1  Control/stop worrying 2 0 0 0  Worry too much - different things 1 1 0 0  Trouble relaxing 0 0 0 1  Restless 0 0 0 0  Easily annoyed or irritable 2 0 0 2  Afraid - awful might happen 0 0 0 0  Total GAD 7 Score 7 1 0 4  Anxiety Difficulty    Somewhat difficult     Review of Systems:   Pertinent items are noted in HPI Denies abnormal vaginal discharge w/ itching/odor/irritation, headaches, visual changes, shortness of breath, chest pain, abdominal pain, severe nausea/vomiting, or problems with urination or bowel movements unless otherwise stated  above. Pertinent History Reviewed:  Reviewed past medical,surgical, social, obstetrical and family history.  Reviewed problem list, medications and allergies. Physical Assessment:   Vitals:   01/10/24 1023  BP: 136/65  Pulse: 76  Weight: 207 lb (93.9 kg)  Body mass index is 35.53 kg/m.           Physical Examination:   General appearance: alert, well appearing, and in no distress  Mental status: alert, oriented to person, place, and time  Skin: warm & dry   Extremities:      Cardiovascular: normal heart rate noted  Respiratory: normal respiratory effort, no distress  Abdomen: gravid, soft, non-tender  Pelvic: Cervical exam deferred         Fetal Status: Fetal Heart Rate (bpm): 164 u/s   Movement: Present    Fetal Surveillance Testing today: US  19+4 wks,breech,fundal placenta gr 0,normal ovaries,CX 4 cm,SVP of fluid 4 cm,SUA,LVEICF,right simple choroid plexus cyst 6 x 4 mm,FHR 164 bpm,EFW 273 g 21%,anatomy complete     No results found for this or any previous visit (from the past 24 hours).  Assessment & Plan:  High-risk pregnancy: G10P0090 at [redacted]w[redacted]d  with an Estimated Date of Delivery: 06/01/24   1) SUA, EFW 21%; will need EFW q 4wks after 28wks (ordered and 28wk scan scheduled)  2) Simple CPC on right, 6x62mm; neg NIPS; will already be following up with growth scans due to SUA  3) Wants waterbirth and doula (taking WB class tomorrow), - Pt is interested in waterbirth.  No contraindications at this time per chart review/patient assessment.   - Pt to enroll in class, see CNMs for most visits in the office.  - Discussed waterbirth as option for low-risk pregnancy.  Reviewed conditions that may arise during pregnancy that will risk pt out of waterbirth including hypertension, diabetes, fetal growth restriction <10%ile, etc.  -Doula info given  4) GBS bacteriuria, will need culture w sensitivities @ 36wks  5) Hx recurrent SABs, requests FHT check in 2wks; taking Prometrium   6)  Preg discomforts, says Healthy Blue covers 2 devices and would like Belly Band or similar; rx sent to [facility]  Meds:  Meds ordered this encounter  Medications   Elastic Bandages & Supports (COMFORT FIT MATERNITY SUPP LG) MISC    Sig: 1 Device by Does not apply route daily.    Dispense:  1 each    Refill:  0    Belly Band or similar, maternity abd support device    Supervising Provider:   MARILYNN NEST [[id number]]    Labs/procedures today: U/S  Treatment Plan:  growth q 4wks after 28wks  Reviewed: Preterm labor symptoms and general obstetric precautions including but not limited to vaginal bleeding, contractions, leaking of fluid and fetal movement were reviewed in detail with the patient.  All questions were answered. Does have home bp cuff. Office bp cuff given: not applicable. Check bp weekly, let us  know if consistently >140 and/or >90.  Follow-up: Return for 2wk FHT check w RN; 4wk HROB; 8wk HROB, EFW u/s, PN2.   Future Appointments  Date Time Provider Department Center  01/24/2024 11:30 AM [facility] NURSE [facility]  01/31/2024  2:15 PM Whitfield Raisin, NP [facility] None  02/07/2024 10:50 AM Loreli Suzen BIRCH, CNM [facility]  02/21/2024 11:10 AM [facility] NURSE [facility]  03/06/2024  8:30 AM [facility] LAB [facility]  03/06/2024  8:40 AM Del Wilhelmena Lloyd Sola, FNP [facility]  03/06/2024  9:15 AM [facility] US  [facility] None  03/06/2024 10:10 AM Kizzie Suzen SAUNDERS, CNM [facility]    Orders Placed This Encounter  Procedures   US  OB Follow Up   Suzen BIRCH Loreli [facility] 01/10/2024 11:39 AM

## 2024-01-16 ENCOUNTER — Inpatient Hospital Stay (HOSPITAL_COMMUNITY)
Admission: AD | Admit: 2024-01-16 | Discharge: 2024-01-17 | Disposition: A | Discharge status: Home / Self Care | Source: Home / Self Care | Attending: Obstetrics and Gynecology | Admitting: Obstetrics and Gynecology

## 2024-01-16 ENCOUNTER — Encounter (HOSPITAL_COMMUNITY): Payer: Self-pay | Admitting: Obstetrics and Gynecology

## 2024-01-16 ENCOUNTER — Inpatient Hospital Stay (HOSPITAL_COMMUNITY)
Admission: AD | Admit: 2024-01-16 | Discharge: 2024-01-16 | Disposition: A | Discharge status: Home / Self Care | Attending: Obstetrics and Gynecology | Admitting: Obstetrics and Gynecology

## 2024-01-16 DIAGNOSIS — Q270 Congenital absence and hypoplasia of umbilical artery: Secondary | ICD-10-CM

## 2024-01-16 DIAGNOSIS — O09292 Supervision of pregnancy with other poor reproductive or obstetric history, second trimester: Secondary | ICD-10-CM | POA: Insufficient documentation

## 2024-01-16 DIAGNOSIS — Z3A20 20 weeks gestation of pregnancy: Secondary | ICD-10-CM

## 2024-01-16 DIAGNOSIS — O26852 Spotting complicating pregnancy, second trimester: Secondary | ICD-10-CM | POA: Diagnosis not present

## 2024-01-16 DIAGNOSIS — N96 Recurrent pregnancy loss: Secondary | ICD-10-CM

## 2024-01-16 DIAGNOSIS — O4692 Antepartum hemorrhage, unspecified, second trimester: Secondary | ICD-10-CM

## 2024-01-16 DIAGNOSIS — O2622 Pregnancy care for patient with recurrent pregnancy loss, second trimester: Secondary | ICD-10-CM | POA: Diagnosis not present

## 2024-01-16 DIAGNOSIS — M797 Fibromyalgia: Secondary | ICD-10-CM | POA: Insufficient documentation

## 2024-01-16 DIAGNOSIS — N930 Postcoital and contact bleeding: Secondary | ICD-10-CM

## 2024-01-16 DIAGNOSIS — O26892 Other specified pregnancy related conditions, second trimester: Secondary | ICD-10-CM

## 2024-01-16 DIAGNOSIS — O99892 Other specified diseases and conditions complicating childbirth: Secondary | ICD-10-CM | POA: Diagnosis not present

## 2024-01-16 DIAGNOSIS — R8271 Bacteriuria: Secondary | ICD-10-CM

## 2024-01-16 DIAGNOSIS — Z3492 Encounter for supervision of normal pregnancy, unspecified, second trimester: Secondary | ICD-10-CM

## 2024-01-16 DIAGNOSIS — O0990 Supervision of high risk pregnancy, unspecified, unspecified trimester: Secondary | ICD-10-CM

## 2024-01-16 NOTE — Progress Notes (Signed)
No bleeding noted on perineum.

## 2024-01-16 NOTE — MAU Provider Note (Signed)
 Chief Complaint: Vaginal Bleeding   Event Date/Time   First Provider Initiated Contact with Patient 01/16/24 1252      SUBJECTIVE HPI: Katherine Mora is a 28 y.o. G10P0090 at [redacted]w[redacted]d by early ultrasound who presents to maternity admissions reporting spotting. Receives care at [facility]. Pregnancy c/b hx of recurrent miscarriages, SCH, SUA.  Patient had spotting to light bleeding this AM after intercourse last night. Did put a panty liner on but has not soaked this. Having mild cramping. Notes having fibromyalgia, and this isn't different from usual pain. Denies urinary symptoms, change in discharge, LOF.  HPI  Past Medical History:  Diagnosis Date   Anxiety    Asthma    bipolar 1    Bipolar   Eczema    Fibromyalgia 2018   Heart palpitations    History of kidney stones    Hypoglycemia    Migraines    PONV (postoperative nausea and vomiting)    Past Surgical History:  Procedure Laterality Date   d and c N/A 2017   14 week size   DILATION AND EVACUATION N/A 08/01/2023   Procedure: DILATATION AND EVACUATION;  Surgeon: Jayne Vonn DEL, MD;  Location: [facility];  Service: Gynecology;  Laterality: N/A;   NASAL SEPTUM SURGERY Bilateral 2015   SINOSCOPY     Social History   Socioeconomic History   Marital status: Single    Spouse name: Not on file   Number of children: 0   Years of education: Not on file   Highest education level: Some college, no degree  Occupational History   Not on file  Tobacco Use   Smoking status: Former    Types: Cigarettes    Passive exposure: Current   Smokeless tobacco: Never  Vaping Use   Vaping status: Former  Substance and Sexual Activity   Alcohol use: Not Currently   Drug use: Not Currently   Sexual activity: Yes    Birth control/protection: None  Other Topics Concern   Not on file  Social History Narrative   Right handed   Drinks soda (rare)   Waiting to get glasses    Social Drivers of Health   Financial Resource Strain:  Medium Risk (07/24/2023)   Overall Financial Resource Strain (CARDIA)    Difficulty of Paying Living Expenses: Somewhat hard  Food Insecurity: No Food Insecurity (07/24/2023)   Hunger Vital Sign    Worried About Running Out of Food in the Last Year: Never true    Ran Out of Food in the Last Year: Never true  Transportation Needs: No Transportation Needs (07/24/2023)   PRAPARE - Administrator, Civil Service (Medical): No    Lack of Transportation (Non-Medical): No  Physical Activity: Insufficiently Active (07/24/2023)   Exercise Vital Sign    Days of Exercise per Week: 3 days    Minutes of Exercise per Session: 30 min  Stress: No Stress Concern Present (07/24/2023)   [employer] of Occupational Health - Occupational Stress Questionnaire    Feeling of Stress : Only a little  Social Connections: Moderately Isolated (07/24/2023)   Social Connection and Isolation Panel    Frequency of Communication with Friends and Family: Twice a week    Frequency of Social Gatherings with Friends and Family: Once a week    Attends Religious Services: 1 to 4 times per year    Active Member of [employer] or Organizations: No    Attends Banker Meetings: Never    Marital  Status: Never married  Intimate Partner Violence: Not At Risk (07/24/2023)   Humiliation, Afraid, Rape, and Kick questionnaire    Fear of Current or Ex-Partner: No    Emotionally Abused: No    Physically Abused: No    Sexually Abused: No   No current facility-administered medications on file prior to encounter.   Current Outpatient Medications on File Prior to Encounter  Medication Sig Dispense Refill   acetaminophen  (TYLENOL ) 500 MG tablet Take 1,000 mg by mouth every 6 (six) hours as needed for moderate pain (pain score 4-6).     aspirin  81 MG chewable tablet Chew 2 tablets (162 mg total) by mouth daily. 60 tablet 7   cetirizine  (ZYRTEC  ALLERGY ) 10 MG tablet Take 1-2 tablets by mouth daily 60 tablet 5    diclofenac  (VOLTAREN ) 75 MG EC tablet Take 1 tablet (75 mg total) by mouth 2 (two) times daily. (Patient not taking: Reported on 01/10/2024) 20 tablet 0   Elastic Bandages & Supports (COMFORT FIT MATERNITY SUPP LG) MISC 1 Device by Does not apply route daily. 1 each 0   EPINEPHrine  (EPIPEN  2-PAK) 0.3 mg/0.3 mL IJ SOAJ injection Inject 0.3 mg into the muscle as needed for anaphylaxis. (Patient not taking: Reported on 01/10/2024) 2 each 2   MAGNESIUM PO Take 483 mg by mouth daily.     ondansetron  (ZOFRAN ) 4 MG tablet Take 1 tablet (4 mg total) by mouth every 8 (eight) hours as needed. 30 tablet 1   pantoprazole  (PROTONIX ) 20 MG tablet Take 1 tablet (20 mg total) by mouth daily. 30 tablet 6   Prenatal Vit-Fe Fumarate-FA (PRENATAL VITAMIN PO) Take 1 tablet by mouth daily.     progesterone  (PROMETRIUM ) 200 MG capsule PLACE 1 CAPSULE IN VAGINA AT BEDTIME NIGHTLY 90 capsule 1   VENTOLIN  HFA 108 (90 Base) MCG/ACT inhaler Inhale 2 puffs into the lungs every 4 (four) hours as needed for wheezing or shortness of breath. (Patient not taking: Reported on 01/10/2024)     zafirlukast  (ACCOLATE ) 10 MG tablet TAKE 1 TABLET BY MOUTH TWICE A DAY. 60 tablet 0   Allergies  Allergen Reactions   Fentanyl Shortness Of Breath and Rash    lethargic   Mometasone Furo-Formoterol Fum Shortness Of Breath and Nausea And Vomiting    Asthma   Montelukast Hives and Shortness Of Breath   Penicillins Anaphylaxis    Family History    Tricyclic Antidepressants Other (See Comments)    Panic Attacks, Asthma Flare    Venlafaxine Hcl Other (See Comments)    Blurred vision, feeling faint   Clindamycin Rash   Latex Rash   Prednisone Anxiety and Rash    ROS:  Pertinent positives/negatives listed above.  I have reviewed patient's Past Medical Hx, Surgical Hx, Family Hx, Social Hx, medications and allergies.   Physical Exam  Patient Vitals for the past 24 hrs:  BP Temp Temp src Pulse Resp SpO2 Height Weight  01/16/24 1206 120/73  98.7 F (37.1 C) Oral 91 17 99 % 5' 4 (1.626 m) 93.6 kg   Constitutional: Well-developed, well-nourished female. Anxious Cardiovascular: normal rate Respiratory: normal effort GI: Abd soft, non-tender MS: Extremities nontender, no edema, normal ROM Neurologic: Alert and oriented x 4  BSUS: Visualized viable ~20 week fetus. Transverse with head maternal left. Excellent movement, normal fluid, FHT 150's. No evidence of SCH seen. Anterior/fundal placenta  Doppler: 160  LAB RESULTS No results found for this or any previous visit (from the past 24 hours).  A/Positive/-- (05/21  1346)  IMAGING No results found.  MAU Management/MDM: Orders Placed This Encounter  Procedures   Discharge patient Discharge disposition: 01-Home or Self Care; Discharge patient date: 01/16/2024    No orders of the defined types were placed in this encounter.   Patient presents to MAU with postcoital bleeding at [redacted]w[redacted]d. Patient understandably anxious given history of recurrent miscarriages. Discussed that postcoital bleeding is common during pregnancy as cervix is more vascular. Does not have indications for UA, vaginal swabs  at this time. Reassuring fetal status. If bleeding becomes heavier or does not resolve within 1 day, to return to MAU for reassessment.  ASSESSMENT 1. Postcoital bleeding   2. History of recurrent miscarriages   3. Viable pregnancy in second trimester   4. [redacted] weeks gestation of pregnancy     PLAN Discharge home with strict return precautions. Allergies as of 01/16/2024       Reactions   Fentanyl Shortness Of Breath, Rash   lethargic   Mometasone Furo-formoterol Fum Shortness Of Breath, Nausea And Vomiting   Asthma   Montelukast Hives, Shortness Of Breath   Penicillins Anaphylaxis   Family History    Tricyclic Antidepressants Other (See Comments)   Panic Attacks, Asthma Flare    Venlafaxine Hcl Other (See Comments)   Blurred vision, feeling faint   Clindamycin Rash   Latex  Rash   Prednisone Anxiety, Rash        Medication List     TAKE these medications    acetaminophen  500 MG tablet Commonly known as: TYLENOL  Take 1,000 mg by mouth every 6 (six) hours as needed for moderate pain (pain score 4-6).   aspirin  81 MG chewable tablet Chew 2 tablets (162 mg total) by mouth daily.   cetirizine  10 MG tablet Commonly known as: ZyrTEC  Allergy  Take 1-2 tablets by mouth daily   Comfort Fit Maternity Supp Lg Misc 1 Device by Does not apply route daily.   diclofenac  75 MG EC tablet Commonly known as: VOLTAREN  Take 1 tablet (75 mg total) by mouth 2 (two) times daily.   EPINEPHrine  0.3 mg/0.3 mL Soaj injection Commonly known as: EpiPen  2-Pak Inject 0.3 mg into the muscle as needed for anaphylaxis.   MAGNESIUM PO Take 483 mg by mouth daily.   ondansetron  4 MG tablet Commonly known as: Zofran  Take 1 tablet (4 mg total) by mouth every 8 (eight) hours as needed.   pantoprazole  20 MG tablet Commonly known as: Protonix  Take 1 tablet (20 mg total) by mouth daily.   PRENATAL VITAMIN PO Take 1 tablet by mouth daily.   progesterone  200 MG capsule Commonly known as: PROMETRIUM  PLACE 1 CAPSULE IN VAGINA AT BEDTIME NIGHTLY   Ventolin  HFA 108 (90 Base) MCG/ACT inhaler Generic drug: albuterol  Inhale 2 puffs into the lungs every 4 (four) hours as needed for wheezing or shortness of breath.   zafirlukast  10 MG tablet Commonly known as: ACCOLATE  TAKE 1 TABLET BY MOUTH TWICE A DAY.         Almarie Moats, MD OB Fellow 01/16/2024  1:59 PM

## 2024-01-16 NOTE — MAU Note (Signed)
 Katherine Mora is a 28 y.o. at [redacted]w[redacted]d here in MAU reporting: this morning when she went to the bathroom, she noted bleeding in her underwear.   Has had some spotting, has a fibroid.  Hx of subchorionic hemorrhage with the preg.   Hx of recurrent SAB, never got beyond 8wks.  This is the most bleeding she has seen with this pregnancy, still 'scant' by pictures  pinkish brown. Had intercourse last night. Aches a tiny bit, nothing severe.  Has a high pain tolerance, always in pain with her fibromyalgia, so unsure.  Onset of complaint: this morning Pain score: tiny ache Vitals:   01/16/24 1206  BP: 120/73  Pulse: 91  Resp: 17  Temp: 98.7 F (37.1 C)  SpO2: 99%     FHT:160 Lab orders placed from triage:

## 2024-01-16 NOTE — Discharge Instructions (Signed)
 You were seen for bleeding after sex. Your baby had an excellent heart rate in the MAU and your ultrasound performed in triage was reassuring with visualized fetal movement.   You should return to the MAU for - increased bleeding that is filling pad - severe abdominal pain - other pregnancy related concerns.

## 2024-01-16 NOTE — MAU Provider Note (Signed)
 Chief Complaint:  Vaginal Bleeding   Event Date/Time   First Provider Initiated Contact with Patient 01/16/24 2305     HPI: Katherine Mora is a 28 y.o. G10P0090 at 31w3dwho presents to maternity admissions reporting vaginal bleeding that is more red in color and saw a small clot.  Was seen here earlier today for post coital bleeding. Wants to make sure baby is ok and wants her cervix examined since she has a history of multiple miscarriages. States is worried her cervix is shortened.  States she is using progesterone  to help her cervix (states had to insist on it when other providers were reluctant).  Is planning a 3 hour drive today to visit a sick family member. .She denies LOF, abdominal pain.  Vaginal Bleeding The patient's primary symptoms include vaginal bleeding. The patient's pertinent negatives include no pelvic pain. This is a recurrent problem. The current episode started today. She is pregnant. The vaginal discharge was bloody. The vaginal bleeding is lighter than menses. She has been passing clots (one small). She has not been passing tissue. The symptoms are aggravated by intercourse. She has tried nothing for the symptoms.   RN Note: Elmer R Norton is a 28 y.o. at [redacted]w[redacted]d here in MAU reporting being here earlier today for vag bleeding. Tonight she had more vag bleeding when she wiped and she was concerned. Last intercourse was yesterday  LMP: n/a        Onset of complaint: this evening       Pain score: 4  Past Medical History: Past Medical History:  Diagnosis Date   Anxiety    Asthma    bipolar 1    Bipolar   Eczema    Fibromyalgia 2018   Heart palpitations    History of kidney stones    Hypoglycemia    Migraines    PONV (postoperative nausea and vomiting)     Past obstetric history: OB History  Gravida Para Term Preterm AB Living  10    9   SAB IAB Ectopic Multiple Live Births  9        # Outcome Date GA Lbr Len/2nd Weight Sex Type Anes PTL Lv  10  Current           9 SAB 07/2023          8 SAB 10/12/21          7 SAB 2017 [redacted]w[redacted]d         6 SAB           5 SAB           4 SAB           3 SAB           2 SAB           1 SAB             Past Surgical History: Past Surgical History:  Procedure Laterality Date   d and c N/A 2017   14 week size   DILATION AND EVACUATION N/A 08/01/2023   Procedure: DILATATION AND EVACUATION;  Surgeon: Jayne Vonn DEL, MD;  Location: [facility];  Service: Gynecology;  Laterality: N/A;   NASAL SEPTUM SURGERY Bilateral 2015   SINOSCOPY      Family History: Family History  Problem Relation Age of Onset   Heart attack Father    Diabetes Father    Eczema Brother    Allergic rhinitis Brother    Urticaria  Brother    Cancer Paternal Aunt        brain   Sarcoidosis Maternal Grandmother    Kidney failure Maternal Grandmother    Cancer Maternal Grandfather        skin   Colon cancer Neg Hx    Colon polyps Neg Hx     Social History: Social History   Tobacco Use   Smoking status: Former    Types: Cigarettes    Passive exposure: Current   Smokeless tobacco: Never  Vaping Use   Vaping status: Former  Substance Use Topics   Alcohol use: Not Currently   Drug use: Not Currently    Allergies:  Allergies  Allergen Reactions   Fentanyl Shortness Of Breath and Rash    lethargic   Mometasone Furo-Formoterol Fum Shortness Of Breath and Nausea And Vomiting    Asthma   Montelukast Hives and Shortness Of Breath   Penicillins Anaphylaxis    Family History    Tricyclic Antidepressants Other (See Comments)    Panic Attacks, Asthma Flare    Venlafaxine Hcl Other (See Comments)    Blurred vision, feeling faint   Clindamycin Rash   Latex Rash   Prednisone Anxiety and Rash    Meds:  Medications Prior to Admission  Medication Sig Dispense Refill Last Dose/Taking   acetaminophen  (TYLENOL ) 500 MG tablet Take 1,000 mg by mouth every 6 (six) hours as needed for moderate pain (pain score 4-6).    01/15/2024   aspirin  81 MG chewable tablet Chew 2 tablets (162 mg total) by mouth daily. 60 tablet 7 01/16/2024   cetirizine  (ZYRTEC  ALLERGY ) 10 MG tablet Take 1-2 tablets by mouth daily 60 tablet 5 01/16/2024   MAGNESIUM PO Take 483 mg by mouth daily.   01/16/2024   ondansetron  (ZOFRAN ) 4 MG tablet Take 1 tablet (4 mg total) by mouth every 8 (eight) hours as needed. 30 tablet 1 Past Month   pantoprazole  (PROTONIX ) 20 MG tablet Take 1 tablet (20 mg total) by mouth daily. 30 tablet 6 01/15/2024   Prenatal Vit-Fe Fumarate-FA (PRENATAL VITAMIN PO) Take 1 tablet by mouth daily.   01/16/2024   progesterone  (PROMETRIUM ) 200 MG capsule PLACE 1 CAPSULE IN VAGINA AT BEDTIME NIGHTLY 90 capsule 1 01/15/2024   zafirlukast  (ACCOLATE ) 10 MG tablet TAKE 1 TABLET BY MOUTH TWICE A DAY. 60 tablet 0 01/16/2024   diclofenac  (VOLTAREN ) 75 MG EC tablet Take 1 tablet (75 mg total) by mouth 2 (two) times daily. (Patient not taking: Reported on 01/10/2024) 20 tablet 0    Elastic Bandages & Supports (COMFORT FIT MATERNITY SUPP LG) MISC 1 Device by Does not apply route daily. 1 each 0    EPINEPHrine  (EPIPEN  2-PAK) 0.3 mg/0.3 mL IJ SOAJ injection Inject 0.3 mg into the muscle as needed for anaphylaxis. (Patient not taking: Reported on 01/10/2024) 2 each 2    VENTOLIN  HFA 108 (90 Base) MCG/ACT inhaler Inhale 2 puffs into the lungs every 4 (four) hours as needed for wheezing or shortness of breath.       I have reviewed patient's Past Medical Hx, Surgical Hx, Family Hx, Social Hx, medications and allergies.   ROS:  Review of Systems  Genitourinary:  Positive for vaginal bleeding. Negative for pelvic pain.   Other systems negative  Physical Exam  Patient Vitals for the past 24 hrs:  BP Temp Pulse Resp SpO2 Height Weight  01/16/24 2229 122/75 -- -- -- -- -- --  01/16/24 2227 -- 98.8 F (37.1 C) 97  17 99 % 5' 4 (1.626 m) 93.9 kg   Constitutional: Well-developed, well-nourished female in no acute distress.  Cardiovascular: normal  rate and rhythm Respiratory: normal effort, clear to auscultation bilaterally GI: Abd soft, non-tender, gravid appropriate for gestational age.   No rebound or guarding. MS: Extremities nontender, no edema, normal ROM Neurologic: Alert and oriented x 4.  GU: Neg CVAT.  PELVIC EXAM: Cervix pink, visually closed, Long,  No friability.  One drop of blood at os, no current flow.  No evidence of polyp.  No vaginal discharge seen.  vaginal walls and external genitalia normal   FHT:   150 Labs: Results for orders placed or performed during the hospital encounter of 01/16/24 (from the past 24 hours)  Urinalysis, Routine w reflex microscopic -Urine, Clean Catch     Status: None   Collection Time: 01/16/24 10:48 PM  Result Value Ref Range   Color, Urine YELLOW YELLOW   APPearance CLEAR CLEAR   Specific Gravity, Urine 1.012 1.005 - 1.030   pH 6.0 5.0 - 8.0   Glucose, UA NEGATIVE NEGATIVE mg/dL   Hgb urine dipstick NEGATIVE NEGATIVE   Bilirubin Urine NEGATIVE NEGATIVE   Ketones, ur NEGATIVE NEGATIVE mg/dL   Protein, ur NEGATIVE NEGATIVE mg/dL   Nitrite NEGATIVE NEGATIVE   Leukocytes,Ua NEGATIVE NEGATIVE   s A/Positive/-- (05/21 1346)  Imaging:  No results found. Reviewed US  done at [facility], placenta is fundal and cervical length was 4cm  MAU Course/MDM: I have reviewed the triage vital signs and the nursing notes.   Pertinent labs & imaging results that were available during my care of the patient were reviewed by me and considered in my medical decision making (see chart for details).      I have reviewed her medical records including past results, notes and treatments.   I have ordered labs and reviewed results. Reviewed UA showed good hydration and no evidence of infection.  Assessment: Single IUP at [redacted]w[redacted]d Bleeding in second trimester  Plan: Discharge home Pelvic rest Bleeding precautions Follow up in Office for prenatal visits and recheck  Pt stable at time of  discharge.  Earnie Pouch CNM, MSN Certified Nurse-Midwife 01/16/2024 11:05 PM

## 2024-01-16 NOTE — MAU Note (Signed)
 Katherine Mora is a 28 y.o. at [redacted]w[redacted]d here in MAU reporting being here earlier today for vag bleeding. Tonight she had more vag bleeding when she wiped and she was concerned. Last intercourse was yesterday  LMP: n/a Onset of complaint: this evening Pain score: 4 Vitals:   01/16/24 2227 01/16/24 2229  BP:  122/75  Pulse: 97   Resp: 17   Temp: 98.8 F (37.1 C)   SpO2: 99%      FHT: 150  Lab orders placed from triage: none

## 2024-01-17 DIAGNOSIS — O4692 Antepartum hemorrhage, unspecified, second trimester: Secondary | ICD-10-CM

## 2024-01-17 DIAGNOSIS — Z3A20 20 weeks gestation of pregnancy: Secondary | ICD-10-CM | POA: Diagnosis not present

## 2024-01-17 LAB — URINALYSIS, ROUTINE W REFLEX MICROSCOPIC
Bilirubin Urine: NEGATIVE
Glucose, UA: NEGATIVE mg/dL
Hgb urine dipstick: NEGATIVE
Ketones, ur: NEGATIVE mg/dL
Leukocytes,Ua: NEGATIVE
Nitrite: NEGATIVE
Protein, ur: NEGATIVE mg/dL
Specific Gravity, Urine: 1.012 (ref 1.005–1.030)
pH: 6 (ref 5.0–8.0)

## 2024-01-17 NOTE — Progress Notes (Signed)
 Written and verbal d/c instructions given and understanding voiced.

## 2024-01-24 ENCOUNTER — Encounter: Payer: Self-pay | Admitting: *Deleted

## 2024-01-24 ENCOUNTER — Ambulatory Visit

## 2024-01-24 VITALS — BP 105/70 | HR 132 | Wt 210.6 lb

## 2024-01-24 DIAGNOSIS — Z3402 Encounter for supervision of normal first pregnancy, second trimester: Secondary | ICD-10-CM

## 2024-01-24 DIAGNOSIS — Z3A21 21 weeks gestation of pregnancy: Secondary | ICD-10-CM

## 2024-01-24 NOTE — Progress Notes (Signed)
   NURSE VISIT- Fetal Heart Rate Check  SUBJECTIVE:  Katherine Mora is a 28 y.o. [phone] female at [redacted]w[redacted]d, here for a fetal heart rate check for Bleeding a week ago, follow-up from MAU visit. Patient did ask if she could use Fresh Test for her glucola.    OBJECTIVE:  BP 105/70 (BP Location: Left Arm, Patient Position: Sitting, Cuff Size: Normal)   Pulse (!) 132   Wt 210 lb 9.6 oz (95.5 kg)   LMP  (LMP Unknown)   BMI 36.15 kg/m   Appears well, no apparent distress  FHR 152 via doppler   ASSESSMENT: G10P0090 at [redacted]w[redacted]d with present fetal heart rate  PLAN: Follow-up as scheduled Fresh Test is okay 75 mg needs to be ordered for glucola.   Aleck FORBES Blase  01/24/2024 11:37 AM

## 2024-01-30 ENCOUNTER — Other Ambulatory Visit: Payer: Self-pay | Admitting: Allergy & Immunology

## 2024-01-31 ENCOUNTER — Encounter: Payer: Self-pay | Admitting: Adult Health

## 2024-01-31 ENCOUNTER — Ambulatory Visit: Payer: Medicaid Other | Admitting: Adult Health

## 2024-01-31 VITALS — BP 126/86 | HR 88 | Ht 64.0 in | Wt 214.0 lb

## 2024-01-31 DIAGNOSIS — G43719 Chronic migraine without aura, intractable, without status migrainosus: Secondary | ICD-10-CM | POA: Diagnosis not present

## 2024-01-31 DIAGNOSIS — E559 Vitamin D deficiency, unspecified: Secondary | ICD-10-CM | POA: Diagnosis not present

## 2024-01-31 DIAGNOSIS — Z3A22 22 weeks gestation of pregnancy: Secondary | ICD-10-CM

## 2024-01-31 NOTE — Progress Notes (Signed)
 Referring:  Katherine Wilhelmena Lloyd Hilario, FNP [phone] [address] [place],  [place] [address]  PCP: Katherine Wilhelmena Lloyd Hilario, FNP   CC:  headaches  History provided from self   Chief Complaint  Patient presents with   Follow-up    RM 3, Pt w/mother. Pt here for f/u from last visit for headaches. Pt is currently pregnant. Pt states getting a lot of frontal and back of head migraines. She said Tylenol  will bring the headaches down some but some days she wakes up with migraine.      HPI:   Update 01/31/2024 JM: Patient returns for 6-month follow-up visit, she is accompanied by her mother.   Completed MRI brain 09/2023 which was normal.  She underwent D&C 1/28.  She is currently [redacted] weeks pregnant, due 11/29  She reports having frontal and occipital headaches usually upon awakening. Can have occasional daytime headaches but usually related to lack of sleep.  Has been using Tylenol  which will decrease severity but does not abort migraine. Has been more sensitive to bright light and loud noise as well as having increased nausea with migraines.  She is currently experiencing about 2-4 migraines per week.  Blood pressure today initially elevated, normalized on recheck.  She remains on magnesium supplement as well as multivitamin.  She is requesting her vitamin D  and magnesium level to be checked today as she reports history of deficiencies.  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  She reports migraines previously well-controlled with about 2 to 3/month, takes magnesium oxide daily, never used Ubrelvy  as migraines not severe enough.  Reports over the past 2 weeks, she has had about 6 migraines.  She is waiting to be scheduled for D&C, she is currently [redacted]w[redacted]d but recent US  showed 7.3 week size.  Reports prior D&C at 28  years old and  multiple chemical pregnancies.  She questions completing MRI brain which was previously ordered by Dr. Rush due to worsening migraine headaches, reports this was not completed as insurance declined although per epic, this was approved (see phone note on 02/27/23).   Consult visit 02/14/2023 Dr. Rush:  Medical co-morbidities: asthma, bipolar disorder, fibromyalgia, nephrolithiasis  The patient presents for evaluation of headaches which began when she was a child. Migraines have been worsening over the past 6 months. She has noticed increasingly blurry vision over the past year as well. Her migraines are either frontal or occipital. They are associated with photophobia, phonophobia, and nausea. She used to have a visual aura, but has not had one recently. She currently has 2-3 migraines per week. They can last 2-4 days at a time.  Headache History: Onset: childhood Aura: used to have visual aura, has not had one recently Associated Symptoms:  Photophobia: yes  Phonophobia: yes  Nausea: yes Other symptoms: yes Worse with activity?: yes Duration of headaches: 2-4 days  Migraine days per month: 2-3   Current Treatment: Abortive Tylenol , Ubrelvy   Preventative Mag ox  Prior Therapies                                 Prevention: Topamax - lack of efficacy Elavil - lack of efficacy Effexor Verapamil - lack of efficacy Gabapentin  100/300/600 Prednisone - anxiety, rash  Rescue: Maxalt - made headaches worse Naratriptan - made headaches worse baclofen Tramadol Zofran  Ubrelvy     LABS: CBC    Component Value Date/Time   WBC 8.7 11/22/2023 1346   WBC 7.6 11/03/2023 2212   RBC 4.90 11/22/2023 1346   RBC 4.79 11/03/2023 2212   HGB 14.6 11/22/2023 1346   HCT 44.1 11/22/2023 1346   PLT 241 11/22/2023 1346   MCV 90 11/22/2023 1346   MCH 29.8 11/22/2023 1346   MCH 29.6 11/03/2023 2212   MCHC 33.1 11/22/2023 1346   MCHC 33.6 11/03/2023 2212   RDW 13.4  11/22/2023 1346   LYMPHSABS 1.9 11/22/2023 1346   MONOABS 0.5 11/03/2023 2212   EOSABS 0.2 11/22/2023 1346   BASOSABS 0.0 11/22/2023 1346      Latest Ref Rng & Units 11/03/2023   10:12 PM 08/08/2023    4:58 PM 11/11/2022    3:53 PM  CMP  Glucose 70 - 99 mg/dL 98  82  78   BUN 6 - 20 mg/dL 15  13  14    Creatinine 0.44 - 1.00 mg/dL 9.41  9.34  9.05   Sodium 135 - 145 mmol/L 133  139  143   Potassium 3.5 - 5.1 mmol/L 3.5  4.4  4.6   Chloride 98 - 111 mmol/L 104  105  103   CO2 22 - 32 mmol/L 21  24  24    Calcium 8.9 - 10.3 mg/dL 9.0  9.5  89.5   Total Protein 6.5 - 8.1 g/dL 6.5  7.4  7.0   Total Bilirubin 0.0 - 1.2 mg/dL 0.4  0.7  0.4   Alkaline Phos 38 - 126 U/L 41  46  66   AST 15 - 41 U/L 17  C 16  29   ALT 0 - 44 U/L 14  14  20      C Corrected result     IMAGING:  MRI brain 07/18/19: unremarkable   Current Outpatient Medications on File Prior to Visit  Medication Sig Dispense Refill   acetaminophen  (TYLENOL ) 500 MG tablet Take 1,000 mg by mouth every 6 (six) hours as needed for moderate pain (pain score 4-6).     aspirin  81 MG chewable tablet Chew 2 tablets (162 mg total) by mouth daily. 60 tablet 7   cetirizine  (ZYRTEC ) 10 MG tablet TAKE 1 TO 2 TABLETS BY MOUTH DAILY 60 tablet 0   Elastic Bandages & Supports (COMFORT FIT MATERNITY SUPP LG) MISC 1 Device by Does not apply route daily. 1 each 0   EPINEPHrine  (EPIPEN  2-PAK) 0.3 mg/0.3 mL IJ SOAJ injection Inject 0.3 mg into the muscle as needed for anaphylaxis. 2 each 2   MAGNESIUM PO Take 483 mg by mouth daily.     ondansetron  (ZOFRAN ) 4 MG tablet Take 1 tablet (4 mg total) by mouth every 8 (eight) hours as needed. 30 tablet 1   pantoprazole  (PROTONIX ) 20 MG tablet Take 1 tablet (20 mg total) by mouth daily. 30 tablet 6   Prenatal Vit-Fe Fumarate-FA (PRENATAL VITAMIN PO) Take 1 tablet by mouth daily.     progesterone  (PROMETRIUM ) 200 MG capsule PLACE 1 CAPSULE IN VAGINA AT BEDTIME NIGHTLY 90 capsule 1   VENTOLIN  HFA 108 (90  Base) MCG/ACT inhaler Inhale 2 puffs into the lungs every 4 (four) hours as needed for wheezing or shortness of breath.     zafirlukast  (ACCOLATE ) 10 MG tablet TAKE 1 TABLET BY MOUTH TWICE A DAY. 60 tablet 0   No current  facility-administered medications on file prior to visit.     Allergies: Allergies  Allergen Reactions   Fentanyl Shortness Of Breath and Rash    lethargic   Mometasone Furo-Formoterol Fum Shortness Of Breath and Nausea And Vomiting    Asthma   Montelukast Hives and Shortness Of Breath   Penicillins Anaphylaxis    Family History    Tricyclic Antidepressants Other (See Comments)    Panic Attacks, Asthma Flare    Venlafaxine Hcl Other (See Comments)    Blurred vision, feeling faint   Clindamycin Rash   Latex Rash   Prednisone Anxiety and Rash    Family History: Family History  Problem Relation Age of Onset   Heart attack Father    Diabetes Father    Eczema Brother    Allergic rhinitis Brother    Urticaria Brother    Cancer Paternal Aunt        brain   Sarcoidosis Maternal Grandmother    Kidney failure Maternal Grandmother    Cancer Maternal Grandfather        skin   Colon cancer Neg Hx    Colon polyps Neg Hx    Aunt had brain cancer  Past Medical History: Past Medical History:  Diagnosis Date   Anxiety    Asthma    bipolar 1    Bipolar   Eczema    Fibromyalgia 2018   Heart palpitations    History of kidney stones    Hypoglycemia    Migraines    PONV (postoperative nausea and vomiting)     Past Surgical History Past Surgical History:  Procedure Laterality Date   d and c N/A 2017   14 week size   DILATION AND EVACUATION N/A 08/01/2023   Procedure: DILATATION AND EVACUATION;  Surgeon: Jayne Vonn DEL, MD;  Location: [facility];  Service: Gynecology;  Laterality: N/A;   NASAL SEPTUM SURGERY Bilateral 2015   SINOSCOPY      Social History: Social History   Tobacco Use   Smoking status: Former    Types: Cigarettes    Passive exposure:  Current   Smokeless tobacco: Never  Vaping Use   Vaping status: Former  Substance Use Topics   Alcohol use: Not Currently   Drug use: Not Currently    ROS: Negative for fevers, chills. Positive for headaches. All other systems reviewed and negative unless stated otherwise in HPI.   Physical Exam:   Vital Signs: BP 126/86   Pulse 88   Ht 5' 4 (1.626 m)   Wt 214 lb (97.1 kg)   LMP  (LMP Unknown)   BMI 36.73 kg/m  GENERAL: well appearing,in no acute distress,alert  NEUROLOGICAL: Mental Status: Alert, oriented to person, place and time,Follows commands Cranial Nerves: PERRL, visual fields intact to confrontation, extraocular movements intact, facial sensation intact, no facial droop or ptosis, hearing grossly intact, no dysarthria, palate elevate symmetrically, tongue protrudes midline, shoulder shrug intact and symmetric Motor: muscle strength 5/5 both upper and lower extremities,no drift, normal tone Reflexes: 2+ throughout Sensation: intact to light touch all 4 extremities Coordination: Finger-to- nose-finger intact bilaterally Gait: normal-based     IMPRESSION: 28 year old female with a history of asthma, bipolar disorder, fibromyalgia, nephrolithiasis who returns for follow-up of migraines.  MRI brain 09/2023 normal.  She is currently [redacted] weeks pregnant, deemed high risk pregnancy due to single umbilical artery and is closely being followed by OB/GYN.  She has been experiencing increased frontal headaches primarily upon awakening.  PLAN: -Discussed limited options for migraine headache treatment during pregnancy and that no medication is entirely safe.  Long conversation regarding risk versus benefit and lack of safety data during pregnancy.  Will be requested that she discuss trialing propranolol, cyproheptadine or nortriptyline with her OB/GYN. If okay to use and patient interested, will provide order.  -Rescue: continue tylenol  as needed.  -Continue daily  magnesium -Continue prenatal vitamins - Continue close follow-up with OB/GYN - will check Vit D and mag labs today per patient request - encouraged routine monitoring of blood pressure at home    Follow-up in 6 months or call earlier if needed    I personally spent a total of 40 minutes in the care of the patient today including preparing to see the patient, performing a medically appropriate exam/evaluation, counseling and educating, placing orders, referring and communicating with other health care professionals, and documenting clinical information in the EHR.   Harlene Bogaert, AGNP-BC  [facility] [address] [place], [place] [address]  Phone [phone] Fax [phone] Note: This document was prepared with digital dictation and possible smart phrase technology. Any transcriptional errors that result from this process are unintentional.

## 2024-01-31 NOTE — Patient Instructions (Addendum)
 Your Plan:  Continue magnesium for migraine prevention  Speak with your OB about starting propranolol, cyproheptadine, or nortriptyline for migraine prevention - if they are okay with starting these, I am more than happy to prescribe if needed  Continue use of Tylenol  as needed  We will check lab work today - will notify you via MyChart with results        Follow up in 6 months or call earlier if needed      Thank you for coming to see us  at [facility]. I hope we have been able to provide you high quality care today.  You may receive a patient satisfaction survey over the next few weeks. We would appreciate your feedback and comments so that we may continue to improve ourselves and the health of our patients.

## 2024-02-07 ENCOUNTER — Ambulatory Visit: Admitting: Advanced Practice Midwife

## 2024-02-07 VITALS — BP 118/77 | HR 144 | Wt 216.0 lb

## 2024-02-07 DIAGNOSIS — Z3402 Encounter for supervision of normal first pregnancy, second trimester: Secondary | ICD-10-CM | POA: Diagnosis not present

## 2024-02-07 DIAGNOSIS — F3342 Major depressive disorder, recurrent, in full remission: Secondary | ICD-10-CM | POA: Diagnosis not present

## 2024-02-07 DIAGNOSIS — Z3A23 23 weeks gestation of pregnancy: Secondary | ICD-10-CM

## 2024-02-07 NOTE — Patient Instructions (Signed)
 Katherine Mora, I greatly value your feedback.  If you receive a survey following your visit with us  today, we appreciate you taking the time to fill it out.  Thanks, Katherine Mora, CNM   You will have your sugar test next visit.  Please do not eat or drink anything after midnight the night before you come, not even water.  You will be here for at least two hours.  Please make an appointment online for the bloodwork at [email or web address] for 8:30am (or as close to this as possible). Make sure you select the [facility] service center. The day of the appointment, check in with our office first, then you will go to [facility] to start the sugar test.    [facility] HAS MOVED!!! It is now [facility] ([address] [place], [place] [address]) Entrance C, located off of [address] [employer] [address] valet parking  Go to [email or web address] to register for FREE online childbirth classes   Call the office [phone]) or go to [facility] if: You begin to have strong, frequent contractions Your water breaks.  Sometimes it is a big gush of fluid, sometimes it is just a trickle that keeps getting your panties wet or running down your legs You have vaginal bleeding.  It is normal to have a small amount of spotting if your cervix was checked.  You don't feel your baby moving like normal.  If you don't, get you something to eat and drink and lay down and focus on feeling your baby move.   If your baby is still not moving like normal, you should call the office or go to [facility].  [place] Pediatricians/Family Doctors: [facility] [phone]           [facility] [phone]                [facility] [phone] (usually not accepting new patients unless you have family there already, you are always welcome to call and ask)      [facility] Department [phone]       [facility] Pediatricians/Family Doctors:  [facility]: [phone] [facility]: [phone] [facility]: [phone]  [facility] Doctors:  [facility]: [phone]  [facility]: [phone]  [facility] Doctors: [facility]: [phone]   Home Blood Pressure Monitoring for Patients   Your provider has recommended that you check your blood pressure (BP) at least once a week at home. If you do not have a blood pressure cuff at home, one will be provided for you. Contact your provider if you have not received your monitor within 1 week.   Helpful Tips for Accurate Home Blood Pressure Checks  Don't smoke, exercise, or drink caffeine 30 minutes before checking your BP Use the restroom before checking your BP (a full bladder can raise your pressure) Relax in a comfortable upright chair Feet on the ground Left arm resting comfortably on a flat surface at the level of your heart Legs uncrossed Back supported Sit quietly and don't talk Place the cuff on your bare arm Adjust snuggly, so that only two fingertips can fit between your skin and the top of the cuff Check 2 readings separated by at least one minute Keep a log of your BP readings For a visual, please reference this diagram: http://ccnc.care/bpdiagram  Provider Name: [facility]     Phone: [phone]  Zone 1: ALL CLEAR  Continue to monitor your symptoms:  BP reading is less than 140 (top number) or less than 90 (bottom number)  No right upper stomach pain No headaches or seeing spots No feeling nauseated or throwing up No swelling in face and hands  Zone 2: CAUTION Call your doctor's office for any of the following:  BP reading is greater than 140 (top number) or greater than 90 (bottom number)  Stomach pain under your ribs in the middle or right side Headaches or seeing spots Feeling nauseated or throwing up Swelling in face and hands  Zone 3: EMERGENCY   Seek immediate medical care if you have any of the following:  BP reading is greater than160 (top number) or greater than 110 (bottom number) Severe headaches not improving with Tylenol  Serious difficulty catching your breath Any worsening symptoms from Zone 2   Second Trimester of Pregnancy The second trimester is from week 13 through week 28, months 4 through 6. The second trimester is often a time when you feel your best. Your body has also adjusted to being pregnant, and you begin to feel better physically. Usually, morning sickness has lessened or quit completely, you may have more energy, and you may have an increase in appetite. The second trimester is also a time when the fetus is growing rapidly. At the end of the sixth month, the fetus is about 9 inches long and weighs about 1 pounds. You will likely begin to feel the baby move (quickening) between 18 and 20 weeks of the pregnancy. BODY CHANGES Your body goes through many changes during pregnancy. The changes vary from woman to woman.  Your weight will continue to increase. You will notice your lower abdomen bulging out. You may begin to get stretch marks on your hips, abdomen, and breasts. You may develop headaches that can be relieved by medicines approved by your health care provider. You may urinate more often because the fetus is pressing on your bladder. You may develop or continue to have heartburn as a result of your pregnancy. You may develop constipation because certain hormones are causing the muscles that push waste through your intestines to slow down. You may develop hemorrhoids or swollen, bulging veins (varicose veins). You may have back pain because of the weight gain and pregnancy hormones relaxing your joints between the bones in your pelvis and as a result of a shift in weight and the muscles that support your balance. Your breasts will continue to grow and be tender. Your gums may bleed and may be sensitive to  brushing and flossing. Dark spots or blotches (chloasma, mask of pregnancy) may develop on your face. This will likely fade after the baby is born. A dark line from your belly button to the pubic area (linea nigra) may appear. This will likely fade after the baby is born. You may have changes in your hair. These can include thickening of your hair, rapid growth, and changes in texture. Some women also have hair loss during or after pregnancy, or hair that feels dry or thin. Your hair will most likely return to normal after your baby is born. WHAT TO EXPECT AT YOUR PRENATAL VISITS During a routine prenatal visit: You will be weighed to make sure you and the fetus are growing normally. Your blood pressure will be taken. Your abdomen will be measured to track your baby's growth. The fetal heartbeat will be listened to. Any test results from the previous visit will be discussed. Your health care provider  may ask you: How you are feeling. If you are feeling the baby move. If you have had any abnormal symptoms, such as leaking fluid, bleeding, severe headaches, or abdominal cramping. If you have any questions. Other tests that may be performed during your second trimester include: Blood tests that check for: Low iron levels (anemia). Gestational diabetes (between 24 and 28 weeks). Rh antibodies. Urine tests to check for infections, diabetes, or protein in the urine. An ultrasound to confirm the proper growth and development of the baby. An amniocentesis to check for possible genetic problems. Fetal screens for spina bifida and Down syndrome. HOME CARE INSTRUCTIONS  Avoid all smoking, herbs, alcohol, and unprescribed drugs. These chemicals affect the formation and growth of the baby. Follow your health care provider's instructions regarding medicine use. There are medicines that are either safe or unsafe to take during pregnancy. Exercise only as directed by your health care provider.  Experiencing uterine cramps is a good sign to stop exercising. Continue to eat regular, healthy meals. Wear a good support bra for breast tenderness. Do not use hot tubs, steam rooms, or saunas. Wear your seat belt at all times when driving. Avoid raw meat, uncooked cheese, cat litter boxes, and soil used by cats. These carry germs that can cause birth defects in the baby. Take your prenatal vitamins. Try taking a stool softener (if your health care provider approves) if you develop constipation. Eat more high-fiber foods, such as fresh vegetables or fruit and whole grains. Drink plenty of fluids to keep your urine clear or pale yellow. Take warm sitz baths to soothe any pain or discomfort caused by hemorrhoids. Use hemorrhoid cream if your health care provider approves. If you develop varicose veins, wear support hose. Elevate your feet for 15 minutes, 3-4 times a day. Limit salt in your diet. Avoid heavy lifting, wear low heel shoes, and practice good posture. Rest with your legs elevated if you have leg cramps or low back pain. Visit your dentist if you have not gone yet during your pregnancy. Use a soft toothbrush to brush your teeth and be gentle when you floss. A sexual relationship may be continued unless your health care provider directs you otherwise. Continue to go to all your prenatal visits as directed by your health care provider. SEEK MEDICAL CARE IF:  You have dizziness. You have mild pelvic cramps, pelvic pressure, or nagging pain in the abdominal area. You have persistent nausea, vomiting, or diarrhea. You have a bad smelling vaginal discharge. You have pain with urination. SEEK IMMEDIATE MEDICAL CARE IF:  You have a fever. You are leaking fluid from your vagina. You have spotting or bleeding from your vagina. You have severe abdominal cramping or pain. You have rapid weight gain or loss. You have shortness of breath with chest pain. You notice sudden or extreme swelling  of your face, hands, ankles, feet, or legs. You have not felt your baby move in over an hour. You have severe headaches that do not go away with medicine. You have vision changes. Document Released: 06/14/2001 Document Revised: 06/25/2013 Document Reviewed: 08/21/2012 [facility] Patient Information 2015 [place], [place]. This information is not intended to replace advice given to you by your health care provider. Make sure you discuss any questions you have with your health care provider.

## 2024-02-07 NOTE — Progress Notes (Signed)
 HIGH-RISK PREGNANCY VISIT Patient name: Rhodie R Barrasso MRN [id number]  Date of birth: Oct 11, 1995 Chief Complaint:   Routine Prenatal Visit  History of Present Illness:   Katherine Mora is a 28 y.o. G10P0090 female at [redacted]w[redacted]d with an Estimated Date of Delivery: 06/01/24 being seen today for ongoing management of a high-risk pregnancy complicated by single umbilical artery.    Today she reports doing okay; headaches are mostly manageable with rest and Tylenol . Contractions: Not present. Vag. Bleeding: None.  Movement: Present. denies leaking of fluid.      11/22/2023   10:40 AM 07/24/2023   11:45 AM 06/29/2023    1:35 PM 01/13/2023   10:36 AM 12/30/2022   11:29 AM  Depression screen PHQ 2/9  Decreased Interest 0 0 0 0   Down, Depressed, Hopeless 0 0 0 0   PHQ - 2 Score 0 0 0 0   Altered sleeping 2 2 0 1   Tired, decreased energy 1 2 0 1   Change in appetite 0 0 0 1   Feeling bad or failure about yourself  0 0 0 1   Trouble concentrating 2 0 0 0   Moving slowly or fidgety/restless 0 0 0 0   Suicidal thoughts 0 0 0 0   PHQ-9 Score 5 4 0 4   Difficult doing work/chores   Not difficult at all  Somewhat difficult        11/22/2023   10:40 AM 07/24/2023   11:45 AM 06/29/2023    1:36 PM 01/13/2023   10:38 AM  GAD 7 : Generalized Anxiety Score  Nervous, Anxious, on Edge 2 0 0 1  Control/stop worrying 2 0 0 0  Worry too much - different things 1 1 0 0  Trouble relaxing 0 0 0 1  Restless 0 0 0 0  Easily annoyed or irritable 2 0 0 2  Afraid - awful might happen 0 0 0 0  Total GAD 7 Score 7 1 0 4  Anxiety Difficulty    Somewhat difficult     Review of Systems:   Pertinent items are noted in HPI Denies abnormal vaginal discharge w/ itching/odor/irritation, headaches, visual changes, shortness of breath, chest pain, abdominal pain, severe nausea/vomiting, or problems with urination or bowel movements unless otherwise stated above. Pertinent History Reviewed:  Reviewed past  medical,surgical, social, obstetrical and family history.  Reviewed problem list, medications and allergies. Physical Assessment:   Vitals:   02/07/24 1048  BP: 118/77  Pulse: (!) 144  Weight: 216 lb (98 kg)  Body mass index is 37.08 kg/m.           Physical Examination:   General appearance: alert, well appearing, and in no distress  Mental status: alert, oriented to person, place, and time  Skin: warm & dry   Extremities:      Cardiovascular: normal heart rate noted  Respiratory: normal respiratory effort, no distress  Abdomen: gravid, soft, non-tender  Pelvic: Cervical exam deferred         Fetal Status: Fetal Heart Rate (bpm): 139 Fundal Height: 24 cm Movement: Present    Fetal Surveillance Testing today: doppler    No results found for this or any previous visit (from the past 24 hours).  Assessment & Plan:  High-risk pregnancy: G10P0090 at [redacted]w[redacted]d with an Estimated Date of Delivery: 06/01/24   1) SUA, plan for growth u/s q 4wks starting at 28wks  2) Hx recurrent SABs, still taking vag progesterone ; wants q  2wk FHT checks for now  3) Wants waterbirth, missed last classes due to family and technical aspects; will re-register  4) H/As, saw neurology and they offered her meds; she doesn't want to take anything that may pose a risk for the baby, so she will stick with just Tylenol  and rest for now  Meds: No orders of the defined types were placed in this encounter.   Labs/procedures today: none  Treatment Plan:  q 4wk growth scans  Reviewed: Preterm labor symptoms and general obstetric precautions including but not limited to vaginal bleeding, contractions, leaking of fluid and fetal movement were reviewed in detail with the patient.  All questions were answered. Does have home bp cuff. Office bp cuff given: not applicable. Check bp weekly, let us  know if consistently >140 and/or >90.  Follow-up: Return for make sure 9/3 appt has PN2.   Future Appointments  Date  Time Provider Department Center  02/21/2024 11:10 AM [facility] NURSE [facility]  03/06/2024  8:30 AM [facility]  03/06/2024  8:40 AM Del Wilhelmena Lloyd Sola, FNP [facility]  03/06/2024  9:15 AM [facility] US  [facility] None  03/06/2024 10:10 AM Kizzie Suzen SAUNDERS, CNM [facility]  07/08/2024 12:45 PM McCue, Harlene, NP [facility] None    No orders of the defined types were placed in this encounter.  Suzen JONETTA Gentry CNM 02/07/2024 11:24 AM

## 2024-02-12 ENCOUNTER — Encounter: Payer: Self-pay | Admitting: Women's Health

## 2024-02-21 ENCOUNTER — Ambulatory Visit: Admitting: *Deleted

## 2024-02-21 VITALS — BP 112/72 | HR 108

## 2024-02-21 DIAGNOSIS — R8271 Bacteriuria: Secondary | ICD-10-CM

## 2024-02-21 DIAGNOSIS — Z3A25 25 weeks gestation of pregnancy: Secondary | ICD-10-CM

## 2024-02-21 DIAGNOSIS — Q270 Congenital absence and hypoplasia of umbilical artery: Secondary | ICD-10-CM

## 2024-02-21 DIAGNOSIS — O0992 Supervision of high risk pregnancy, unspecified, second trimester: Secondary | ICD-10-CM

## 2024-02-21 DIAGNOSIS — O0990 Supervision of high risk pregnancy, unspecified, unspecified trimester: Secondary | ICD-10-CM

## 2024-02-21 NOTE — Progress Notes (Signed)
   NURSE VISIT- Fetal Heart Rate Check  SUBJECTIVE:  Katherine Mora is a 28 y.o. [phone] female at [redacted]w[redacted]d, here for a fetal heart rate check for history of recurrent SAB.    OBJECTIVE:  BP 112/72   Pulse (!) 108   LMP  (LMP Unknown)   Appears well, no apparent distress  FHR 154 via doppler   ASSESSMENT: G10P0090 at [redacted]w[redacted]d with present fetal heart rate  PLAN: Follow-up as scheduled  Rutherford Rover  02/21/2024 12:18 PM

## 2024-02-23 ENCOUNTER — Encounter: Payer: Self-pay | Admitting: Cardiology

## 2024-02-23 ENCOUNTER — Other Ambulatory Visit (INDEPENDENT_AMBULATORY_CARE_PROVIDER_SITE_OTHER)

## 2024-02-23 ENCOUNTER — Inpatient Hospital Stay (HOSPITAL_COMMUNITY)
Admission: AD | Admit: 2024-02-23 | Discharge: 2024-02-23 | Disposition: A | Discharge status: Home / Self Care | Attending: Obstetrics & Gynecology | Admitting: Obstetrics & Gynecology

## 2024-02-23 ENCOUNTER — Encounter (HOSPITAL_COMMUNITY): Payer: Self-pay | Admitting: Obstetrics & Gynecology

## 2024-02-23 ENCOUNTER — Ambulatory Visit (INDEPENDENT_AMBULATORY_CARE_PROVIDER_SITE_OTHER): Admitting: Cardiology

## 2024-02-23 VITALS — BP 118/68 | HR 90 | Ht 64.0 in | Wt 220.1 lb

## 2024-02-23 DIAGNOSIS — R232 Flushing: Secondary | ICD-10-CM | POA: Diagnosis not present

## 2024-02-23 DIAGNOSIS — Z3A25 25 weeks gestation of pregnancy: Secondary | ICD-10-CM | POA: Diagnosis not present

## 2024-02-23 DIAGNOSIS — R002 Palpitations: Secondary | ICD-10-CM

## 2024-02-23 DIAGNOSIS — O26892 Other specified pregnancy related conditions, second trimester: Secondary | ICD-10-CM | POA: Diagnosis not present

## 2024-02-23 LAB — CBC
HCT: 39.5 % (ref 36.0–46.0)
Hemoglobin: 13.3 g/dL (ref 12.0–15.0)
MCH: 30.2 pg (ref 26.0–34.0)
MCHC: 33.7 g/dL (ref 30.0–36.0)
MCV: 89.6 fL (ref 80.0–100.0)
Platelets: 205 K/uL (ref 150–400)
RBC: 4.41 MIL/uL (ref 3.87–5.11)
RDW: 13.9 % (ref 11.5–15.5)
WBC: 17.9 K/uL — ABNORMAL HIGH (ref 4.0–10.5)
nRBC: 0 % (ref 0.0–0.2)

## 2024-02-23 LAB — COMPREHENSIVE METABOLIC PANEL WITH GFR
ALT: 20 U/L (ref 0–44)
AST: 20 U/L (ref 15–41)
Albumin: 3.2 g/dL — ABNORMAL LOW (ref 3.5–5.0)
Alkaline Phosphatase: 51 U/L (ref 38–126)
Anion gap: 10 (ref 5–15)
BUN: 8 mg/dL (ref 6–20)
CO2: 21 mmol/L — ABNORMAL LOW (ref 22–32)
Calcium: 9.2 mg/dL (ref 8.9–10.3)
Chloride: 106 mmol/L (ref 98–111)
Creatinine, Ser: 0.54 mg/dL (ref 0.44–1.00)
GFR, Estimated: >60 mL/min (ref >60)
Glucose, Bld: 96 mg/dL (ref 70–99)
Potassium: 4.1 mmol/L (ref 3.5–5.1)
Sodium: 137 mmol/L (ref 135–145)
Total Bilirubin: 0.4 mg/dL (ref 0.0–1.2)
Total Protein: 6.4 g/dL — ABNORMAL LOW (ref 6.5–8.1)

## 2024-02-23 LAB — PROTEIN / CREATININE RATIO, URINE
Creatinine, Urine: 140 mg/dL
Protein Creatinine Ratio: 0.07 mg/mg{creat} (ref 0.00–0.15)
Total Protein, Urine: 10 mg/dL

## 2024-02-23 LAB — URINALYSIS, ROUTINE W REFLEX MICROSCOPIC
Bilirubin Urine: NEGATIVE
Glucose, UA: NEGATIVE mg/dL
Hgb urine dipstick: NEGATIVE
Ketones, ur: 20 mg/dL — AB
Leukocytes,Ua: NEGATIVE
Nitrite: NEGATIVE
Protein, ur: NEGATIVE mg/dL
Specific Gravity, Urine: 1.019 (ref 1.005–1.030)
pH: 6 (ref 5.0–8.0)

## 2024-02-23 LAB — TSH: TSH: 2.576 u[IU]/mL (ref 0.350–4.500)

## 2024-02-23 NOTE — Patient Instructions (Signed)
 Medication Instructions:  Your physician recommends that you continue on your current medications as directed. Please refer to the Current Medication list given to you today.  *If you need a refill on your cardiac medications before your next appointment, please call your pharmacy*   Testing/Procedures: ZIO XT- Long Term Monitor Instructions  Your physician has requested you wear a ZIO patch monitor for 14 days.  This is a single patch monitor. [employer] supplies one patch monitor per enrollment. Additional stickers are not available. Please do not apply patch if you will be having a Nuclear Stress Test,  Echocardiogram, Cardiac CT, MRI, or Chest Xray during the period you would be wearing the  monitor. The patch cannot be worn during these tests. You cannot remove and re-apply the  ZIO XT patch monitor.  Your ZIO patch monitor will be mailed 3 day USPS to your address on file. It may take 3-5 days  to receive your monitor after you have been enrolled.  Once you have received your monitor, please review the enclosed instructions. Your monitor  has already been registered assigning a specific monitor serial # to you.  Billing and Patient Assistance Program Information  We have supplied [employer] with any of your insurance information on file for billing purposes. [employer] offers a sliding scale Patient Assistance Program for patients that do not have  insurance, or whose insurance does not completely cover the cost of the ZIO monitor.  You must apply for the Patient Assistance Program to qualify for this discounted rate.  To apply, please call [employer] at [phone], select option 4, select option 2, ask to apply for  Patient Assistance Program. Meredeth will ask your household income, and how many people  are in your household. They will quote your out-of-pocket cost based on that information.  [employer] will also be able to set up a 12-month, interest-free payment plan if needed.  Applying  the monitor   Shave hair from upper left chest.  Hold abrader disc by orange tab. Rub abrader in 40 strokes over the upper left chest as  indicated in your monitor instructions.  Clean area with 4 enclosed alcohol pads. Let dry.  Apply patch as indicated in monitor instructions. Patch will be placed under collarbone on left  side of chest with arrow pointing upward.  Rub patch adhesive wings for 2 minutes. Remove white label marked 1. Remove the white  label marked 2. Rub patch adhesive wings for 2 additional minutes.  While looking in a mirror, press and release button in center of patch. A small green light will  flash 3-4 times. This will be your only indicator that the monitor has been turned on.  Do not shower for the first 24 hours. You may shower after the first 24 hours.  Press the button if you feel a symptom. You will hear a small click. Record Date, Time and  Symptom in the Patient Logbook.  When you are ready to remove the patch, follow instructions on the last 2 pages of Patient  Logbook. Stick patch monitor onto the last page of Patient Logbook.  Place Patient Logbook in the blue and white box. Use locking tab on box and tape box closed  securely. The blue and white box has prepaid postage on it. Please place it in the mailbox as  soon as possible. Your physician should have your test results approximately 7 days after the  monitor has been mailed back to [facility].  Call [facility]  at [phone] if you have questions regarding  your ZIO XT patch monitor. Call them immediately if you see an orange light blinking on your  monitor.  If your monitor falls off in less than 4 days, contact our Monitor department at [phone].  If your monitor becomes loose or falls off after 4 days call [employer] at [phone] for  suggestions on securing your monitor   Follow-Up: At [facility], you and your health needs are our priority.  As part  of our continuing mission to provide you with exceptional heart care, our providers are all part of one team.  This team includes your primary Cardiologist (physician) and Advanced Practice Providers or APPs (Physician Assistants and Nurse Practitioners) who all work together to provide you with the care you need, when you need it.  Your next appointment:   10 week(s)  Provider:   Kardie Tobb, DO

## 2024-02-23 NOTE — MAU Provider Note (Signed)
 History     CSN: [id number]  Arrival date and time: 02/23/24 0150   Event Date/Time   First Provider Initiated Contact with Patient 02/23/2024  2:16 AM   Chief Complaint  Patient presents with   Headache   Nausea   Hot Flashes    HPI  Katherine Mora is a 28 y.o. G10P0090 at [redacted]w[redacted]d who presents to the MAU for hot flashes, dizziness, headaches. Sxs started today despite no changes to her daily routine, lasted for 30-45 min before resolving on own. She was unable to check her blood sugar at the time (has known hx hypoglycemia). Reports vomiting with the episodes. She has been evaluated by cardiology in past for tachycardia and palpitations, wore an event monitor, which showed PVCs. Feels symptoms somewhat different than prior episode. No CP, SOB.   Past Medical History:  Diagnosis Date   Anxiety    Asthma    bipolar 1    Bipolar   Eczema    Fibromyalgia 2018   Heart palpitations    History of kidney stones    Hypoglycemia    Migraines    PONV (postoperative nausea and vomiting)     Past Surgical History:  Procedure Laterality Date   d and c N/A 2017   14 week size   DILATION AND EVACUATION N/A 08/01/2023   Procedure: DILATATION AND EVACUATION;  Surgeon: Jayne Vonn DEL, MD;  Location: [facility];  Service: Gynecology;  Laterality: N/A;   NASAL SEPTUM SURGERY Bilateral 2015   SINOSCOPY      Family History  Problem Relation Age of Onset   Heart attack Father    Diabetes Father    Eczema Brother    Allergic rhinitis Brother    Urticaria Brother    Cancer Paternal Aunt        brain   Sarcoidosis Maternal Grandmother    Kidney failure Maternal Grandmother    Cancer Maternal Grandfather        skin   Colon cancer Neg Hx    Colon polyps Neg Hx     Social History   Tobacco Use   Smoking status: Former    Types: Cigarettes    Passive exposure: Current   Smokeless tobacco: Never  Vaping Use   Vaping status: Former  Substance Use Topics   Alcohol use: Not  Currently   Drug use: Not Currently    Allergies:  Allergies  Allergen Reactions   Fentanyl Shortness Of Breath and Rash    lethargic   Mometasone Furo-Formoterol Fum Shortness Of Breath and Nausea And Vomiting    Asthma   Montelukast Hives and Shortness Of Breath   Penicillins Anaphylaxis    Family History    Tricyclic Antidepressants Other (See Comments)    Panic Attacks, Asthma Flare    Venlafaxine Hcl Other (See Comments)    Blurred vision, feeling faint   Clindamycin Rash   Latex Rash   Prednisone Anxiety and Rash    Medications Prior to Admission  Medication Sig Dispense Refill Last Dose/Taking   acetaminophen  (TYLENOL ) 500 MG tablet Take 1,000 mg by mouth every 6 (six) hours as needed for moderate pain (pain score 4-6).   Past Week   cetirizine  (ZYRTEC ) 10 MG tablet TAKE 1 TO 2 TABLETS BY MOUTH DAILY 60 tablet 0 02/22/2024   MAGNESIUM PO Take 483 mg by mouth daily.   02/23/2024 Morning   ondansetron  (ZOFRAN ) 4 MG tablet Take 1 tablet (4 mg total) by mouth every 8 (eight)  hours as needed. 30 tablet 1 Past Week   pantoprazole  (PROTONIX ) 20 MG tablet Take 1 tablet (20 mg total) by mouth daily. 30 tablet 6 Past Week   Prenatal Vit-Fe Fumarate-FA (PRENATAL VITAMIN PO) Take 1 tablet by mouth daily.   02/23/2024 Morning   progesterone  (PROMETRIUM ) 200 MG capsule PLACE 1 CAPSULE IN VAGINA AT BEDTIME NIGHTLY 90 capsule 1 02/22/2024   zafirlukast  (ACCOLATE ) 10 MG tablet TAKE 1 TABLET BY MOUTH TWICE A DAY. 60 tablet 0 02/22/2024   aspirin  81 MG chewable tablet Chew 2 tablets (162 mg total) by mouth daily. (Patient not taking: Reported on 02/23/2024) 60 tablet 7 Not Taking   Elastic Bandages & Supports (COMFORT FIT MATERNITY SUPP LG) MISC 1 Device by Does not apply route daily. 1 each 0    EPINEPHrine  (EPIPEN  2-PAK) 0.3 mg/0.3 mL IJ SOAJ injection Inject 0.3 mg into the muscle as needed for anaphylaxis. 2 each 2    VENTOLIN  HFA 108 (90 Base) MCG/ACT inhaler Inhale 2 puffs into the lungs  every 4 (four) hours as needed for wheezing or shortness of breath.       ROS reviewed and pertinent positives and negatives as documented in HPI.  Physical Exam   Blood pressure 123/68, pulse 98, temperature 97.9 F (36.6 C), temperature source Oral, resp. rate 18, height 5' 4 (1.626 m), weight 99.5 kg, SpO2 100%, unknown if currently breastfeeding.  Physical Exam Constitutional:      General: She is not in acute distress.    Appearance: Normal appearance. She is not ill-appearing.  HENT:     Head: Normocephalic and atraumatic.  Cardiovascular:     Rate and Rhythm: Normal rate.  Pulmonary:     Effort: Pulmonary effort is normal.     Breath sounds: Normal breath sounds.  Abdominal:     Palpations: Abdomen is soft.     Tenderness: There is no abdominal tenderness. There is no guarding.  Musculoskeletal:        General: Normal range of motion.  Skin:    General: Skin is warm and dry.     Findings: No rash.  Neurological:     General: No focal deficit present.     Mental Status: She is alert and oriented to person, place, and time.   EFM: 145/mod/-a/-d  EKG: Sinus rhythm w sinus arrhythmia, no ST or T wave changes, normal intervals.   MAU Course  Procedures  MDM 28 y.o. G10P0090 at [redacted]w[redacted]d presenting for dizziness and hot flashes throughout the day. Not positional in nature, unable to associate w any specific triggers. Her BP and HR are wnl here. Labs obtained -- glucose ok, no electrolyte derangements, pt not anemic. TSH is pending at time of discharge. EKG w sinus arrhythmia, but no ST or T wave changes, normal intervals. Suspect may be related to periods of hypoglycemia vs arrhythmia. I encouraged pt to check CBG when having symptoms, increase hydration, and will refer to [facility] cardiology for further evaluation and possible event monitor. Return precautions reviewed, stable for d/c.  Assessment and Plan     ICD-10-CM   1. Palpitations  R00.2 Ambulatory referral to Cardiology     2. Hot flashes  R23.2 Ambulatory referral to Cardiology    Rec hydration, watch BP and CBG closely Referral to cardio OB sent Return precautions given   Shayra Anton, MD OB Fellow, [facility], [facility] for [facility]  02/23/2024, 5:32 AM

## 2024-02-23 NOTE — MAU Note (Addendum)
 EKG completed and results given to Dr Von. OK to d/c EFM per Dr Von. FM had traced [facility] intermittently since 0500. FHTs 150s when EFM removed.

## 2024-02-23 NOTE — Progress Notes (Signed)
 Written and verbal d/c instructions given and understanding voiced.

## 2024-02-23 NOTE — MAU Note (Signed)
 MAU Triage Note:  .Katherine Mora is a 28 y.o. at [redacted]w[redacted]d here in MAU reporting: around 1100 started having episodes of intermittent headaches, dizziness, hot flashes, and nausea. Theses episodes will last for 30-45 minutes before disappearing and coming back later. Triage phone nurse encouraged her to come in for evaluation of PEC. Has had 3-4 episodes since it first began. Reports 3 episodes of nausea today. No active vomiting in triage. Reports BP at home was 126/88. Denies VB or watery LOF.   PIH Assessment: Headache present: Yes see above note Visual disturbances: Blurred RUQ pain/Epigastric: None Atypical edema: None Hx of HBP: Patient denies BP Medications: None prescribed  Patient complaint: possible pre eclampsia  Pain Score: 2  Pain Location: Head     Onset of complaint: today LMP: No LMP recorded (lmp unknown). Patient is pregnant.  Vitals:   02/23/24 0216  BP: 123/68  Pulse: 98  Resp: 18  Temp: 97.9 F (36.6 C)  SpO2: 100%    FHT:  Fetal Heart Rate Mode: Doppler Baseline Rate (A): 146 bpm Multiple birth?: No Lab orders placed from triage: UA

## 2024-02-23 NOTE — Progress Notes (Signed)
 Cardio-Obstetrics Clinic  New Evaluation  Date:  02/29/2024   ID:  Katherine Mora, DOB July 19, 1995, MRN [id number]  PCP:  Terry Wilhelmena Lloyd Hilario, FNP   [facility] Providers Cardiologist:  Redell Shallow, MD  Electrophysiologist:  None       Referring MD: Del Orbe Polanco, Ilian*   Chief Complaint:  I am oK  History of Present Illness:    Katherine Mora is a 28 y.o. female [G10P0090] who is being seen today for the evaluation of palpitations at the request of Del Orbe Polanco, Ilian*.   Medical hx shows rare evidence of PVC, Fibromyalgia, Bipolar, Asthma, anxiety and migraines.   She was seen in 2022 by Dr. Redell Shallow and Josefa Emelia PIETY at that time her testing showed rare PVC. She was started on Toprol  but appears she did not take the medication   Discussed the use of AI scribe software for clinical note transcription with the patient, who gave verbal consent to proceed  She  presents with heart palpitations and dizziness during pregnancy. She was referred by the ER for evaluation of heart-related symptoms during pregnancy.  She experiences episodes of feeling very hot, dizziness, and nausea, with blood pressure rising to the 120s-130s over high 80s. These occur even when sitting or lying down. Shortness of breath and a racing heart are noted, especially with physical activity such as climbing stairs or walking short distances, leading to dizziness and requiring rest. Her family history is significant for heart disease, with her father having multiple heart attacks and undergoing a triple bypass and stent placements. She has no leg swelling beyond her usual calf swelling related to fibromyalgia and does not experience nocturnal dyspnea, attributing nighttime awakenings to nocturia. She was previously on baby aspirin  but has discontinued it.  Prior CV Studies Reviewed: The following studies were reviewed today: None   Past Medical History:  Diagnosis  Date   Anxiety    Asthma    bipolar 1    Bipolar   Eczema    Fibromyalgia 2018   Heart palpitations    History of kidney stones    Hypoglycemia    Migraines    PONV (postoperative nausea and vomiting)     Past Surgical History:  Procedure Laterality Date   d and c N/A 2017   14 week size   DILATION AND EVACUATION N/A 08/01/2023   Procedure: DILATATION AND EVACUATION;  Surgeon: Jayne Vonn DEL, MD;  Location: [facility];  Service: Gynecology;  Laterality: N/A;   NASAL SEPTUM SURGERY Bilateral 2015   SINOSCOPY        OB History     Gravida  10   Para      Term      Preterm      AB  9   Living         SAB  9   IAB      Ectopic      Multiple      Live Births                  Current Medications: Current Meds  Medication Sig   acetaminophen  (TYLENOL ) 500 MG tablet Take 1,000 mg by mouth every 6 (six) hours as needed for moderate pain (pain score 4-6).   cetirizine  (ZYRTEC ) 10 MG tablet TAKE 1 TO 2 TABLETS BY MOUTH DAILY   Elastic Bandages & Supports (COMFORT FIT MATERNITY SUPP LG) MISC 1 Device by Does not apply route daily.  EPINEPHrine  (EPIPEN  2-PAK) 0.3 mg/0.3 mL IJ SOAJ injection Inject 0.3 mg into the muscle as needed for anaphylaxis.   MAGNESIUM PO Take 483 mg by mouth daily.   ondansetron  (ZOFRAN ) 4 MG tablet Take 1 tablet (4 mg total) by mouth every 8 (eight) hours as needed.   pantoprazole  (PROTONIX ) 20 MG tablet Take 1 tablet (20 mg total) by mouth daily.   Prenatal Vit-Fe Fumarate-FA (PRENATAL VITAMIN PO) Take 1 tablet by mouth daily.   progesterone  (PROMETRIUM ) 200 MG capsule PLACE 1 CAPSULE IN VAGINA AT BEDTIME NIGHTLY   VENTOLIN  HFA 108 (90 Base) MCG/ACT inhaler Inhale 2 puffs into the lungs every 4 (four) hours as needed for wheezing or shortness of breath.   zafirlukast  (ACCOLATE ) 10 MG tablet TAKE 1 TABLET BY MOUTH TWICE A DAY.     Allergies:   Fentanyl, Mometasone furo-formoterol fum, Montelukast, Penicillins, Tricyclic antidepressants,  Venlafaxine hcl, Clindamycin, Latex, and Prednisone   Social History   Socioeconomic History   Marital status: Single    Spouse name: Not on file   Number of children: 0   Years of education: Not on file   Highest education level: Some college, no degree  Occupational History   Not on file  Tobacco Use   Smoking status: Former    Types: Cigarettes    Passive exposure: Current   Smokeless tobacco: Never  Vaping Use   Vaping status: Former  Substance and Sexual Activity   Alcohol use: Not Currently   Drug use: Not Currently   Sexual activity: Yes    Birth control/protection: None  Other Topics Concern   Not on file  Social History Narrative   Right handed   Drinks soda (rare)   Waiting to get glasses    Social Drivers of Health   Financial Resource Strain: Medium Risk (07/24/2023)   Overall Financial Resource Strain (CARDIA)    Difficulty of Paying Living Expenses: Somewhat hard  Food Insecurity: No Food Insecurity (07/24/2023)   Hunger Vital Sign    Worried About Running Out of Food in the Last Year: Never true    Ran Out of Food in the Last Year: Never true  Transportation Needs: No Transportation Needs (07/24/2023)   PRAPARE - Administrator, Civil Service (Medical): No    Lack of Transportation (Non-Medical): No  Physical Activity: Insufficiently Active (07/24/2023)   Exercise Vital Sign    Days of Exercise per Week: 3 days    Minutes of Exercise per Session: 30 min  Stress: No Stress Concern Present (07/24/2023)   [employer] of Occupational Health - Occupational Stress Questionnaire    Feeling of Stress : Only a little  Social Connections: Moderately Isolated (07/24/2023)   Social Connection and Isolation Panel    Frequency of Communication with Friends and Family: Twice a week    Frequency of Social Gatherings with Friends and Family: Once a week    Attends Religious Services: 1 to 4 times per year    Active Member of [employer] or Organizations: No     Attends Engineer, structural: Never    Marital Status: Never married      Family History  Problem Relation Age of Onset   Heart attack Father    Diabetes Father    Eczema Brother    Allergic rhinitis Brother    Urticaria Brother    Cancer Paternal Aunt        brain   Sarcoidosis Maternal Grandmother    Kidney failure  Maternal Grandmother    Cancer Maternal Grandfather        skin   Colon cancer Neg Hx    Colon polyps Neg Hx       ROS:   Please see the history of present illness.     All other systems reviewed and are negative.   Labs/EKG Reviewed:    EKG:   EKG today shows sinus rhythm with sinus arrhythmia   Recent Labs: 02/23/2024: ALT 20; BUN 8; Creatinine, Ser 0.54; Hemoglobin 13.3; Platelets 205; Potassium 4.1; Sodium 137; TSH 2.576   Recent Lipid Panel Lab Results  Component Value Date/Time   CHOL 190 11/11/2022 03:53 PM   TRIG 147 11/11/2022 03:53 PM   HDL 57 11/11/2022 03:53 PM   CHOLHDL 3.3 11/11/2022 03:53 PM   LDLCALC 107 (H) 11/11/2022 03:53 PM    Physical Exam:    VS:  BP 118/68 (BP Location: Left Arm, Patient Position: Sitting, Cuff Size: Normal)   Pulse 90   Ht 5' 4 (1.626 m)   Wt 220 lb 1.6 oz (99.8 kg)   LMP  (LMP Unknown)   SpO2 98%   BMI 37.78 kg/m     Wt Readings from Last 3 Encounters:  02/23/24 220 lb 1.6 oz (99.8 kg)  02/23/24 219 lb 4.8 oz (99.5 kg)  02/07/24 216 lb (98 kg)     GEN:  Well nourished, well developed in no acute distress HEENT: Normal NECK: No JVD; No carotid bruits LYMPHATICS: No lymphadenopathy CARDIAC: RRR, no murmurs, rubs, gallops RESPIRATORY:  Clear to auscultation without rales, wheezing or rhonchi  ABDOMEN: Soft, non-tender, non-distended MUSCULOSKELETAL:  No edema; No deformity  SKIN: Warm and dry NEUROLOGIC:  Alert and oriented x 3 PSYCHIATRIC:  Normal affect    Risk Assessment/Risk Calculators:     CARPREG II Risk Prediction Index Score:  1.  The patient's risk for a primary  cardiac event is 5%.   Modified World Health Organization [facility]) Classification of Maternal CV Risk   Class I         ASSESSMENT & PLAN:    PVC now with palpitations [redacted] weeks gestation   Palpitations and tachycardia during pregnancy Intermittent palpitations and tachycardia likely exacerbated by pregnancy-related stress. Previous monitoring showed skipped beats without significant findings. Echocardiogram not repeated due to normal previous results and young age. - Place a cardiac monitor for 14 days to assess heart rhythm during pregnancy. - Review results of the cardiac monitor to guide future management.   Gastroesophageal reflux during pregnancy Increased acid reflux symptoms during second trimester, exacerbated by certain foods and eating habits. - Advise dietary modifications, including avoiding oily foods and tomatoes, eating more than two hours before bedtime, and choosing dry snacks.   Patient Instructions  Medication Instructions:  Your physician recommends that you continue on your current medications as directed. Please refer to the Current Medication list given to you today.  *If you need a refill on your cardiac medications before your next appointment, please call your pharmacy*   Testing/Procedures: ZIO XT- Long Term Monitor Instructions  Your physician has requested you wear a ZIO patch monitor for 14 days.  This is a single patch monitor. [employer] supplies one patch monitor per enrollment. Additional stickers are not available. Please do not apply patch if you will be having a Nuclear Stress Test,  Echocardiogram, Cardiac CT, MRI, or Chest Xray during the period you would be wearing the  monitor. The patch cannot be worn during these tests. You  cannot remove and re-apply the  ZIO XT patch monitor.  Your ZIO patch monitor will be mailed 3 day USPS to your address on file. It may take 3-5 days  to receive your monitor after you have been enrolled.  Once you have  received your monitor, please review the enclosed instructions. Your monitor  has already been registered assigning a specific monitor serial # to you.  Billing and Patient Assistance Program Information  We have supplied [employer] with any of your insurance information on file for billing purposes. [employer] offers a sliding scale Patient Assistance Program for patients that do not have  insurance, or whose insurance does not completely cover the cost of the ZIO monitor.  You must apply for the Patient Assistance Program to qualify for this discounted rate.  To apply, please call [employer] at [phone], select option 4, select option 2, ask to apply for  Patient Assistance Program. Meredeth will ask your household income, and how many people  are in your household. They will quote your out-of-pocket cost based on that information.  [employer] will also be able to set up a 70-month, interest-free payment plan if needed.  Applying the monitor   Shave hair from upper left chest.  Hold abrader disc by orange tab. Rub abrader in 40 strokes over the upper left chest as  indicated in your monitor instructions.  Clean area with 4 enclosed alcohol pads. Let dry.  Apply patch as indicated in monitor instructions. Patch will be placed under collarbone on left  side of chest with arrow pointing upward.  Rub patch adhesive wings for 2 minutes. Remove white label marked 1. Remove the white  label marked 2. Rub patch adhesive wings for 2 additional minutes.  While looking in a mirror, press and release button in center of patch. A small green light will  flash 3-4 times. This will be your only indicator that the monitor has been turned on.  Do not shower for the first 24 hours. You may shower after the first 24 hours.  Press the button if you feel a symptom. You will hear a small click. Record Date, Time and  Symptom in the Patient Logbook.  When you are ready to remove the patch, follow instructions on  the last 2 pages of Patient  Logbook. Stick patch monitor onto the last page of Patient Logbook.  Place Patient Logbook in the blue and white box. Use locking tab on box and tape box closed  securely. The blue and white box has prepaid postage on it. Please place it in the mailbox as  soon as possible. Your physician should have your test results approximately 7 days after the  monitor has been mailed back to [facility].  Call [facility] Customer Care at [phone] if you have questions regarding  your ZIO XT patch monitor. Call them immediately if you see an orange light blinking on your  monitor.  If your monitor falls off in less than 4 days, contact our Monitor department at [phone].  If your monitor becomes loose or falls off after 4 days call [employer] at [phone] for  suggestions on securing your monitor   Follow-Up: At [facility], you and your health needs are our priority.  As part of our continuing mission to provide you with exceptional heart care, our providers are all part of one team.  This team includes your primary Cardiologist (physician) and Advanced Practice Providers or APPs (Physician Assistants and Nurse Practitioners) who all work  together to provide you with the care you need, when you need it.  Your next appointment:   10 week(s)  Provider:   Prabhjot Maddux, DO    Dispo:  No follow-ups on file.   Medication Adjustments/Labs and Tests Ordered: Current medicines are reviewed at length with the patient today.  Concerns regarding medicines are outlined above.  Tests Ordered: Orders Placed This Encounter  Procedures   LONG TERM MONITOR (3-14 DAYS)   Medication Changes: No orders of the defined types were placed in this encounter.

## 2024-03-03 ENCOUNTER — Encounter: Payer: Self-pay | Admitting: Women's Health

## 2024-03-06 ENCOUNTER — Encounter: Payer: Self-pay | Admitting: Women's Health

## 2024-03-06 ENCOUNTER — Ambulatory Visit: Admitting: Family Medicine

## 2024-03-06 ENCOUNTER — Ambulatory Visit

## 2024-03-06 ENCOUNTER — Ambulatory Visit: Admitting: Women's Health

## 2024-03-06 ENCOUNTER — Other Ambulatory Visit

## 2024-03-06 VITALS — BP 113/78 | HR 69 | Wt 226.4 lb

## 2024-03-06 DIAGNOSIS — R8271 Bacteriuria: Secondary | ICD-10-CM

## 2024-03-06 DIAGNOSIS — Z131 Encounter for screening for diabetes mellitus: Secondary | ICD-10-CM

## 2024-03-06 DIAGNOSIS — Z3A27 27 weeks gestation of pregnancy: Secondary | ICD-10-CM

## 2024-03-06 DIAGNOSIS — F3170 Bipolar disorder, currently in remission, most recent episode unspecified: Secondary | ICD-10-CM | POA: Diagnosis not present

## 2024-03-06 DIAGNOSIS — Q270 Congenital absence and hypoplasia of umbilical artery: Secondary | ICD-10-CM

## 2024-03-06 DIAGNOSIS — O0990 Supervision of high risk pregnancy, unspecified, unspecified trimester: Secondary | ICD-10-CM

## 2024-03-06 DIAGNOSIS — Z3483 Encounter for supervision of other normal pregnancy, third trimester: Secondary | ICD-10-CM | POA: Diagnosis not present

## 2024-03-06 NOTE — Progress Notes (Signed)
 LOW-RISK PREGNANCY VISIT Patient name: Katherine Mora MRN [id number]  Date of birth: 05/01/1996 Chief Complaint:   Routine Prenatal Visit and Pregnancy Ultrasound (PN2, tdap decline)  History of Present Illness:   Katherine Mora is a 28 y.o. G10P0090 female at [redacted]w[redacted]d with an Estimated Date of Delivery: 06/01/24 being seen today for ongoing management of a low-risk pregnancy.   Today she reports saw OB cards for tachy/palpitations, wearing Zeo monitor. Went to [facility] 8/22 w/ dizziness, hot flashes, states was instructed to check sugars to assess for hypoglycemia- out of strips. Took waterbirth class.  Contractions: Not present.  .  Movement: Present. denies leaking of fluid.     03/06/2024   10:03 AM 11/22/2023   10:40 AM 07/24/2023   11:45 AM 06/29/2023    1:35 PM 01/13/2023   10:36 AM  Depression screen PHQ 2/9  Decreased Interest 0 0 0 0 0  Down, Depressed, Hopeless 0 0 0 0 0  PHQ - 2 Score 0 0 0 0 0  Altered sleeping 1 2 2  0 1  Tired, decreased energy 1 1 2  0 1  Change in appetite 0 0 0 0 1  Feeling bad or failure about yourself  0 0 0 0 1  Trouble concentrating 1 2 0 0 0  Moving slowly or fidgety/restless 0 0 0 0 0  Suicidal thoughts 0 0 0 0 0  PHQ-9 Score 3 5 4  0 4  Difficult doing work/chores    Not difficult at all         11/22/2023   10:40 AM 07/24/2023   11:45 AM 06/29/2023    1:36 PM 01/13/2023   10:38 AM  GAD 7 : Generalized Anxiety Score  Nervous, Anxious, on Edge 2 0 0 1  Control/stop worrying 2 0 0 0  Worry too much - different things 1 1 0 0  Trouble relaxing 0 0 0 1  Restless 0 0 0 0  Easily annoyed or irritable 2 0 0 2  Afraid - awful might happen 0 0 0 0  Total GAD 7 Score 7 1 0 4  Anxiety Difficulty    Somewhat difficult      Review of Systems:   Pertinent items are noted in HPI Denies abnormal vaginal discharge w/ itching/odor/irritation, headaches, visual changes, shortness of breath, chest pain, abdominal pain, severe nausea/vomiting, or  problems with urination or bowel movements unless otherwise stated above. Pertinent History Reviewed:  Reviewed past medical,surgical, social, obstetrical and family history.  Reviewed problem list, medications and allergies. Physical Assessment:   Vitals:   03/06/24 0959  BP: 113/78  Pulse: 69  Weight: 226 lb 6.4 oz (102.7 kg)  Body mass index is 38.86 kg/m.        Physical Examination:   General appearance: Well appearing, and in no distress  Mental status: Alert, oriented to person, place, and time  Skin: Warm & dry  Cardiovascular: Normal heart rate noted  Respiratory: Normal respiratory effort, no distress  Abdomen: Soft, gravid, nontender  Pelvic: Cervical exam deferred         Extremities: Edema: None  Fetal Status:     Movement: Present  US  27+4 wks,cephalic,cx 3 cm,anterior fundal placenta gr 0,FHR 156 bpm,LVEICF,SUA,AFI 17 cm,EFW 1064 g 30%   Chaperone: N/A No results found for this or any previous visit (from the past 24 hours).  Assessment & Plan:  1) Low-risk pregnancy G10P0090 at [redacted]w[redacted]d with an Estimated Date of Delivery: 06/01/24  2) SUA, EFW today 30%, U/S 32, 36wks    no testing    no change in delivery   3) Palpitations/tachycardia> saw OB cards 8/22, wearing Zeo monitor, f/u 10/28 as scheduled  4) Interested in waterbirth> has taken class, to send certificate, understands providers at hospital on admission have final say on eligibility. OB cards f/u pending  5) H/O recurrent SABs   Meds: No orders of the defined types were placed in this encounter.  Labs/procedures today: U/S, PN2, and declined Tdap  Plan:  Continue routine obstetrical care  Next visit: prefers in person    Reviewed: Preterm labor symptoms and general obstetric precautions including but not limited to vaginal bleeding, contractions, leaking of fluid and fetal movement were reviewed in detail with the patient.  All questions were answered. Does have home bp cuff. Office bp cuff given:  not applicable. Check bp weekly, let us  know if consistently >140 and/or >90.  Follow-up: Return in about 3 weeks (around 03/27/2024) for LROB, CNM, in person; EFW & LROB 32wks & 36wks.  Future Appointments  Date Time Provider Department Center  04/30/2024 11:20 AM Tobb, Kardie, DO [facility]  07/08/2024 12:45 PM Whitfield Raisin, NP [facility] None  07/17/2024  1:00 PM Bevely Doffing, FNP [facility] [address]    No orders of the defined types were placed in this encounter.  Suzen JONELLE Fetters CNM, [facility] 03/06/2024 10:44 AM

## 2024-03-06 NOTE — Patient Instructions (Addendum)
 Katherine Mora, thank you for choosing our office today! We appreciate the opportunity to meet your healthcare needs. You may receive a short survey by mail, e-mail, or through [employer]. If you are happy with your care we would appreciate if you could take just a few minutes to complete the survey questions. We read all of your comments and take your feedback very seriously. Thank you again for choosing our office.  Center for [employer] Team at [facility]  [facility] at [facility] ([address] [place], [place] [address]) Entrance C, located off of [address] Free 24/7 valet parking   CLASSES: Go to [email or web address] to register for classes (childbirth, breastfeeding, waterbirth, infant CPR, daddy bootcamp, etc.)  Call the office [phone]) or go to [facility] if: You begin to have strong, frequent contractions Your water breaks.  Sometimes it is a big gush of fluid, sometimes it is just a trickle that keeps getting your panties wet or running down your legs You have vaginal bleeding.  It is normal to have a small amount of spotting if your cervix was checked.  You don't feel your baby moving like normal.  If you don't, get you something to eat and drink and lay down and focus on feeling your baby move.   If your baby is still not moving like normal, you should call the office or go to [facility].  Call the office [phone]) or go to [facility] hospital for these signs of pre-eclampsia: Severe headache that does not go away with Tylenol  Visual changes- seeing spots, double, blurred vision Pain under your right breast or upper abdomen that does not go away with Tums or heartburn medicine Nausea and/or vomiting Severe swelling in your hands, feet, and face   Tdap Vaccine It is recommended that you get the Tdap vaccine during the third trimester of EACH pregnancy to help protect your baby from getting pertussis (whooping cough) 27-36 weeks is the BEST time to do  this so that you can pass the protection on to your baby. During pregnancy is better than after pregnancy, but if you are unable to get it during pregnancy it will be offered at the hospital.  You can get this vaccine with us , at the health department, your family doctor, or some local pharmacies Everyone who will be around your baby should also be up-to-date on their vaccines before the baby comes. Adults (who are not pregnant) only need 1 dose of Tdap during adulthood.   [facility] Pediatricians/Family Doctors [facility]): [address] Luba BROCKS, [phone]           [facility]: [address], [phone]                [facility]): [address], [phone] (call to ask if accepting patients) [facility]: [address], [place], [phone]    [facility] Pediatricians/Family Doctors [facility]): [phone] [address], [phone] [facility]: [address] [place], [phone] [facility]: [address], [phone]  [facility] Doctors  [facility]): [phone] [facility]: [address], [phone]   [facility] Doctors [facility]: [address], [phone]  [facility] Family Doctors  [facility]: [phone], [phone]  Home Blood Pressure Monitoring for Patients   Your provider has recommended that you check your  blood pressure (BP) at least once a week at home. If you do not have a blood pressure cuff at home, one will be provided for you. Contact your provider if you have not received your monitor within 1 week.   Helpful Tips for Accurate Home Blood Pressure Checks  Don't smoke, exercise, or drink caffeine 30 minutes before checking your BP Use the restroom before checking your BP (a full bladder can raise your  pressure) Relax in a comfortable upright chair Feet on the ground Left arm resting comfortably on a flat surface at the level of your heart Legs uncrossed Back supported Sit quietly and don't talk Place the cuff on your bare arm Adjust snuggly, so that only two fingertips can fit between your skin and the top of the cuff Check 2 readings separated by at least one minute Keep a log of your BP readings For a visual, please reference this diagram: http://ccnc.care/bpdiagram  Provider Name: [facility]     Phone: [phone]  Zone 1: ALL CLEAR  Continue to monitor your symptoms:  BP reading is less than 140 (top number) or less than 90 (bottom number)  No right upper stomach pain No headaches or seeing spots No feeling nauseated or throwing up No swelling in face and hands  Zone 2: CAUTION Call your doctor's office for any of the following:  BP reading is greater than 140 (top number) or greater than 90 (bottom number)  Stomach pain under your ribs in the middle or right side Headaches or seeing spots Feeling nauseated or throwing up Swelling in face and hands  Zone 3: EMERGENCY  Seek immediate medical care if you have any of the following:  BP reading is greater than160 (top number) or greater than 110 (bottom number) Severe headaches not improving with Tylenol  Serious difficulty catching your breath Any worsening symptoms from Zone 2   Third Trimester of Pregnancy The third trimester is from week 29 through week 42, months 7 through 9. The third trimester is a time when the fetus is growing rapidly. At the end of the ninth month, the fetus is about 20 inches in length and weighs 6-10 pounds.  BODY CHANGES Your body goes through many changes during pregnancy. The changes vary from woman to woman.  Your weight will continue to increase. You can expect to gain 25-35 pounds (11-16 kg) by the end of the pregnancy. You may begin to get stretch marks on your hips, abdomen,  and breasts. You may urinate more often because the fetus is moving lower into your pelvis and pressing on your bladder. You may develop or continue to have heartburn as a result of your pregnancy. You may develop constipation because certain hormones are causing the muscles that push waste through your intestines to slow down. You may develop hemorrhoids or swollen, bulging veins (varicose veins). You may have pelvic pain because of the weight gain and pregnancy hormones relaxing your joints between the bones in your pelvis. Backaches may result from overexertion of the muscles supporting your posture. You may have changes in your hair. These can include thickening of your hair, rapid growth, and changes in texture. Some women also have hair loss during or after pregnancy, or hair that feels dry or thin. Your hair will most likely return to normal after your baby is born. Your breasts will continue to grow and be tender. A yellow discharge may leak from your breasts called colostrum. Your belly button may stick out. You may  feel short of breath because of your expanding uterus. You may notice the fetus dropping, or moving lower in your abdomen. You may have a bloody mucus discharge. This usually occurs a few days to a week before labor begins. Your cervix becomes thin and soft (effaced) near your due date. WHAT TO EXPECT AT YOUR PRENATAL EXAMS  You will have prenatal exams every 2 weeks until week 36. Then, you will have weekly prenatal exams. During a routine prenatal visit: You will be weighed to make sure you and the fetus are growing normally. Your blood pressure is taken. Your abdomen will be measured to track your baby's growth. The fetal heartbeat will be listened to. Any test results from the previous visit will be discussed. You may have a cervical check near your due date to see if you have effaced. At around 36 weeks, your caregiver will check your cervix. At the same time, your  caregiver will also perform a test on the secretions of the vaginal tissue. This test is to determine if a type of bacteria, Group B streptococcus, is present. Your caregiver will explain this further. Your caregiver may ask you: What your birth plan is. How you are feeling. If you are feeling the baby move. If you have had any abnormal symptoms, such as leaking fluid, bleeding, severe headaches, or abdominal cramping. If you have any questions. Other tests or screenings that may be performed during your third trimester include: Blood tests that check for low iron levels (anemia). Fetal testing to check the health, activity level, and growth of the fetus. Testing is done if you have certain medical conditions or if there are problems during the pregnancy. FALSE LABOR You may feel small, irregular contractions that eventually go away. These are called Braxton Hicks contractions, or false labor. Contractions may last for hours, days, or even weeks before true labor sets in. If contractions come at regular intervals, intensify, or become painful, it is best to be seen by your caregiver.  SIGNS OF LABOR  Menstrual-like cramps. Contractions that are 5 minutes apart or less. Contractions that start on the top of the uterus and spread down to the lower abdomen and back. A sense of increased pelvic pressure or back pain. A watery or bloody mucus discharge that comes from the vagina. If you have any of these signs before the 37th week of pregnancy, call your caregiver right away. You need to go to the hospital to get checked immediately. HOME CARE INSTRUCTIONS  Avoid all smoking, herbs, alcohol, and unprescribed drugs. These chemicals affect the formation and growth of the baby. Follow your caregiver's instructions regarding medicine use. There are medicines that are either safe or unsafe to take during pregnancy. Exercise only as directed by your caregiver. Experiencing uterine cramps is a good sign to  stop exercising. Continue to eat regular, healthy meals. Wear a good support bra for breast tenderness. Do not use hot tubs, steam rooms, or saunas. Wear your seat belt at all times when driving. Avoid raw meat, uncooked cheese, cat litter boxes, and soil used by cats. These carry germs that can cause birth defects in the baby. Take your prenatal vitamins. Try taking a stool softener (if your caregiver approves) if you develop constipation. Eat more high-fiber foods, such as fresh vegetables or fruit and whole grains. Drink plenty of fluids to keep your urine clear or pale yellow. Take warm sitz baths to soothe any pain or discomfort caused by hemorrhoids. Use hemorrhoid cream if  your caregiver approves. If you develop varicose veins, wear support hose. Elevate your feet for 15 minutes, 3-4 times a day. Limit salt in your diet. Avoid heavy lifting, wear low heal shoes, and practice good posture. Rest a lot with your legs elevated if you have leg cramps or low back pain. Visit your dentist if you have not gone during your pregnancy. Use a soft toothbrush to brush your teeth and be gentle when you floss. A sexual relationship may be continued unless your caregiver directs you otherwise. Do not travel far distances unless it is absolutely necessary and only with the approval of your caregiver. Take prenatal classes to understand, practice, and ask questions about the labor and delivery. Make a trial run to the hospital. Pack your hospital bag. Prepare the baby's nursery. Continue to go to all your prenatal visits as directed by your caregiver. SEEK MEDICAL CARE IF: You are unsure if you are in labor or if your water has broken. You have dizziness. You have mild pelvic cramps, pelvic pressure, or nagging pain in your abdominal area. You have persistent nausea, vomiting, or diarrhea. You have a bad smelling vaginal discharge. You have pain with urination. SEEK IMMEDIATE MEDICAL CARE IF:  You  have a fever. You are leaking fluid from your vagina. You have spotting or bleeding from your vagina. You have severe abdominal cramping or pain. You have rapid weight loss or gain. You have shortness of breath with chest pain. You notice sudden or extreme swelling of your face, hands, ankles, feet, or legs. You have not felt your baby move in over an hour. You have severe headaches that do not go away with medicine. You have vision changes. Document Released: 06/14/2001 Document Revised: 06/25/2013 Document Reviewed: 08/21/2012 [facility] Patient Information 2015 [place], [place]. This information is not intended to replace advice given to you by your health care provider. Make sure you discuss any questions you have with your health care provider.    Considering Waterbirth? Guide for patients at Center for [employer] [facility]) Why consider waterbirth? Gentle birth for babies  Less pain medicine used in labor  May allow for passive descent/less pushing  May reduce perineal tears  More mobility and instinctive maternal position changes  Increased maternal relaxation   Is waterbirth safe? What are the risks of infection, drowning or other complications? Infection:  Very low risk (3.7 % for tub vs 4.8% for bed)  7 in 8000 waterbirths with documented infection  Poorly cleaned equipment most common cause  Slightly lower group B strep transmission rate  Drowning  Maternal:  Very low risk  Related to seizures or fainting  Newborn:  Very low risk. No evidence of increased risk of respiratory problems in multiple large studies  Physiological protection from breathing under water  Avoid underwater birth if there are any fetal complications  Once baby's head is out of the water, keep it out.  Birth complication  Some reports of cord trauma, but risk decreased by bringing baby to surface gradually  No evidence of increased risk of shoulder dystocia. Mothers can usually change  positions faster in water than in a bed, possibly aiding the maneuvers to free the shoulder.   There are 2 things you MUST do to have a waterbirth with [facility]: Attend a waterbirth class at [employer] [facility] at [facility]   3rd Wednesday of every month from 7-9 pm (virtual during COVID) [employer] at [email or web address] or [email or web address] or by calling [phone] Bring  us  the certificate from the class to your prenatal appointment or send via MyChart Meet with a midwife at 36 weeks* to see if you can still plan a waterbirth and to sign the consent.   *We also recommend that you schedule as many of your prenatal visits with a midwife as possible.    Helpful information: You may want to bring a bathing suit top to the hospital to wear during labor but this is optional.  All other supplies are provided by the hospital. Please arrive at the hospital with signs of active labor, and do not wait at home until late in labor. It takes 45 min- 1 hour for fetal monitoring, and check in to your room to take place, plus transport and filling of the waterbirth tub.    Things that would prevent you from having a waterbirth: Premature, <37wks  Previous cesarean birth  Presence of thick meconium-stained fluid  Multiple gestation (Twins, triplets, etc.)  Uncontrolled diabetes or gestational diabetes requiring medication  Hypertension diagnosed in pregnancy or preexisting hypertension (gestational hypertension, preeclampsia, or chronic hypertension) Fetal growth restriction (your baby measures less than 10th percentile on ultrasound) Heavy vaginal bleeding  Non-reassuring fetal heart rate  Active infection (MRSA, etc.). Group B Strep is NOT a contraindication for waterbirth.  If your labor has to be induced and induction method requires continuous monitoring of the baby's heart rate  Other risks/issues identified by your obstetrical provider   Please remember that birth is  unpredictable. Under certain unforeseeable circumstances your provider may advise against giving birth in the tub. These decisions will be made on a case-by-case basis and with the safety of you and your baby as our highest priority.    Updated 10/06/21

## 2024-03-06 NOTE — Progress Notes (Signed)
 US  27+4 wks,cephalic,cx 3 cm,anterior fundal placenta gr 0,FHR 156 bpm,LVEICF,SUA,AFI 17 cm,EFW 1064 g 30%

## 2024-03-07 ENCOUNTER — Ambulatory Visit: Payer: Self-pay | Admitting: Women's Health

## 2024-03-07 ENCOUNTER — Encounter: Payer: Self-pay | Admitting: Women's Health

## 2024-03-07 DIAGNOSIS — O0990 Supervision of high risk pregnancy, unspecified, unspecified trimester: Secondary | ICD-10-CM

## 2024-03-07 DIAGNOSIS — R42 Dizziness and giddiness: Secondary | ICD-10-CM

## 2024-03-07 LAB — CBC
Hematocrit: 40.3 % (ref 34.0–46.6)
Hemoglobin: 13.1 g/dL (ref 11.1–15.9)
MCH: 30.4 pg (ref 26.6–33.0)
MCHC: 32.5 g/dL (ref 31.5–35.7)
MCV: 94 fL (ref 79–97)
Platelets: 205 x10E3/uL (ref 150–450)
RBC: 4.31 x10E6/uL (ref 3.77–5.28)
RDW: 13.1 % (ref 11.7–15.4)
WBC: 11.2 x10E3/uL — ABNORMAL HIGH (ref 3.4–10.8)

## 2024-03-07 LAB — GLUCOSE TOLERANCE, 2 HOURS W/ 1HR
Glucose, 1 hour: 103 mg/dL (ref 70–179)
Glucose, 2 hour: 85 mg/dL (ref 70–152)
Glucose, Fasting: 69 mg/dL — ABNORMAL LOW (ref 70–91)

## 2024-03-07 LAB — ANTIBODY SCREEN: Antibody Screen: NEGATIVE

## 2024-03-07 LAB — RPR: RPR Ser Ql: NONREACTIVE

## 2024-03-07 LAB — HIV ANTIBODY (ROUTINE TESTING W REFLEX): HIV Screen 4th Generation wRfx: NONREACTIVE

## 2024-03-07 MED ORDER — GLUCOSE BLOOD VI STRP: ORAL_STRIP | 12 refills | Status: DC

## 2024-03-08 ENCOUNTER — Encounter: Payer: Self-pay | Admitting: Obstetrics and Gynecology

## 2024-03-14 ENCOUNTER — Encounter: Payer: Self-pay | Admitting: Women's Health

## 2024-03-22 ENCOUNTER — Ambulatory Visit: Payer: Self-pay | Admitting: Cardiology

## 2024-03-22 DIAGNOSIS — R002 Palpitations: Secondary | ICD-10-CM

## 2024-03-25 ENCOUNTER — Encounter: Payer: Self-pay | Admitting: Women's Health

## 2024-03-27 ENCOUNTER — Ambulatory Visit (INDEPENDENT_AMBULATORY_CARE_PROVIDER_SITE_OTHER): Admitting: Obstetrics & Gynecology

## 2024-03-27 VITALS — BP 109/74 | HR 107 | Wt 231.2 lb

## 2024-03-27 DIAGNOSIS — Q270 Congenital absence and hypoplasia of umbilical artery: Secondary | ICD-10-CM | POA: Diagnosis not present

## 2024-03-27 DIAGNOSIS — R8271 Bacteriuria: Secondary | ICD-10-CM

## 2024-03-27 DIAGNOSIS — Z3A30 30 weeks gestation of pregnancy: Secondary | ICD-10-CM | POA: Diagnosis not present

## 2024-03-27 DIAGNOSIS — O0990 Supervision of high risk pregnancy, unspecified, unspecified trimester: Secondary | ICD-10-CM

## 2024-03-27 DIAGNOSIS — O0993 Supervision of high risk pregnancy, unspecified, third trimester: Secondary | ICD-10-CM | POA: Diagnosis not present

## 2024-03-27 NOTE — Progress Notes (Signed)
 HIGH-RISK PREGNANCY VISIT Patient name: Katherine Mora MRN [id number]  Date of birth: 12-17-1995 Chief Complaint:   Routine Prenatal Visit  History of Present Illness:   Katherine Mora is a 28 y.o. G10P0090 female at [redacted]w[redacted]d with an Estimated Date of Delivery: 06/01/24 being seen today for ongoing management of a high-risk pregnancy complicated by:  -SUA -GBS bacteruria  She currently notes an upper respiratory infection, tried OTC meds and did not like the way she felt.  She would prefer to avoid any medication.  Contractions: Not present. Vag. Bleeding: None.  Movement: Present. denies leaking of fluid.      03/06/2024   10:03 AM 11/22/2023   10:40 AM 07/24/2023   11:45 AM 06/29/2023    1:35 PM 01/13/2023   10:36 AM  Depression screen PHQ 2/9  Decreased Interest 0 0 0 0 0  Down, Depressed, Hopeless 0 0 0 0 0  PHQ - 2 Score 0 0 0 0 0  Altered sleeping 1 2 2  0 1  Tired, decreased energy 1 1 2  0 1  Change in appetite 0 0 0 0 1  Feeling bad or failure about yourself  0 0 0 0 1  Trouble concentrating 1 2 0 0 0  Moving slowly or fidgety/restless 0 0 0 0 0  Suicidal thoughts 0 0 0 0 0  PHQ-9 Score 3 5 4  0 4  Difficult doing work/chores    Not difficult at all      Current Outpatient Medications  Medication Instructions   acetaminophen  (TYLENOL ) 1,000 mg, Every 6 hours PRN   aspirin  162 mg, Oral, Daily   cetirizine  (ZYRTEC ) 10-20 mg, Oral, Daily   Elastic Bandages & Supports (COMFORT FIT MATERNITY SUPP LG) MISC 1 Device, Does not apply, Daily   EPINEPHrine  (EPIPEN  2-PAK) 0.3 mg, Intramuscular, As needed   glucose blood test strip Use as instructed to check blood sugar four times daily   MAGNESIUM PO 483 mg, Daily   ondansetron  (ZOFRAN ) 4 mg, Oral, Every 8 hours PRN   pantoprazole  (PROTONIX ) 20 mg, Oral, Daily   Prenatal Vit-Fe Fumarate-FA (PRENATAL VITAMIN PO) 1 tablet, Daily   progesterone  (PROMETRIUM ) 200 MG capsule PLACE 1 CAPSULE IN VAGINA AT BEDTIME NIGHTLY    VENTOLIN  HFA 108 (90 Base) MCG/ACT inhaler 2 puffs, Every 4 hours PRN   zafirlukast  (ACCOLATE ) 10 MG tablet TAKE 1 TABLET BY MOUTH TWICE A DAY.     Review of Systems:   Pertinent items are noted in HPI Denies abnormal vaginal discharge w/ itching/odor/irritation, headaches, visual changes, shortness of breath, chest pain, abdominal pain, severe nausea/vomiting, or problems with urination or bowel movements unless otherwise stated above. Pertinent History Reviewed:  Reviewed past medical,surgical, social, obstetrical and family history.  Reviewed problem list, medications and allergies. Physical Assessment:   Vitals:   03/27/24 1013  BP: 109/74  Pulse: (!) 107  Weight: 231 lb 3.2 oz (104.9 kg)  Body mass index is 39.69 kg/m.           Physical Examination:   General appearance: alert, well appearing, and in no distress  Mental status: normal mood, behavior, speech, dress, motor activity, and thought processes  Skin: warm & dry   Extremities:      Cardiovascular: normal heart rate noted  Respiratory: normal respiratory effort, no distress  Abdomen: gravid, soft, non-tender  Pelvic: Cervical exam deferred         Fetal Status: Fetal Heart Rate (bpm): 150 Fundal Height: 29 cm Movement:  Present    Fetal Surveillance Testing today: doppler   Chaperone: N/A    No results found for this or any previous visit (from the past 24 hours).   Assessment & Plan:  High-risk pregnancy: G10P0090 at [redacted]w[redacted]d with an Estimated Date of Delivery: 06/01/24   1) SUA -followed by serial growth scans  2) GBS bacteriuria  Meds: No orders of the defined types were placed in this encounter.   Labs/procedures today: none  Treatment Plan: Routine OB care  Reviewed: Preterm labor symptoms and general obstetric precautions including but not limited to vaginal bleeding, contractions, leaking of fluid and fetal movement were reviewed in detail with the patient.  All questions were answered.  Patient  has home bp cuff. Check bp weekly, let us  know if >140/90.   Follow-up: Return for 2 weeks LROB as scheduled.   Future Appointments  Date Time Provider Department Center  04/10/2024 10:45 AM [facility] US  [facility] None  04/10/2024 11:30 AM Jayne Vonn DEL, MD [facility]TO[facility]YN  04/24/2024 11:10 AM Loreli Suzen BIRCH, CNM [facility]TO[facility]YN  04/30/2024 11:20 AM Tobb, Dub, DO [facility] H&V  05/08/2024 10:45 AM [facility] - [facility] US  [facility] None  05/08/2024 11:30 AM Marilynn Nest, DO [facility] FTOBGYN  07/08/2024 12:45 PM Whitfield, Harlene, NP [facility] None  07/17/2024  1:00 PM Bevely Doffing, FNP [facility] [address]    No orders of the defined types were placed in this encounter.   Decklyn Hornik, DO Attending Obstetrician & Gynecologist, [facility] for [employer], [facility]

## 2024-04-10 ENCOUNTER — Ambulatory Visit (INDEPENDENT_AMBULATORY_CARE_PROVIDER_SITE_OTHER): Admitting: Obstetrics & Gynecology

## 2024-04-10 ENCOUNTER — Other Ambulatory Visit

## 2024-04-10 VITALS — BP 121/77 | HR 98 | Wt 237.0 lb

## 2024-04-10 DIAGNOSIS — Q270 Congenital absence and hypoplasia of umbilical artery: Secondary | ICD-10-CM

## 2024-04-10 DIAGNOSIS — Z3A32 32 weeks gestation of pregnancy: Secondary | ICD-10-CM | POA: Diagnosis not present

## 2024-04-10 DIAGNOSIS — O0993 Supervision of high risk pregnancy, unspecified, third trimester: Secondary | ICD-10-CM

## 2024-04-10 DIAGNOSIS — O0990 Supervision of high risk pregnancy, unspecified, unspecified trimester: Secondary | ICD-10-CM

## 2024-04-10 DIAGNOSIS — Z3A37 37 weeks gestation of pregnancy: Secondary | ICD-10-CM | POA: Diagnosis not present

## 2024-04-10 DIAGNOSIS — O321XX0 Maternal care for breech presentation, not applicable or unspecified: Secondary | ICD-10-CM | POA: Diagnosis not present

## 2024-04-10 DIAGNOSIS — R8271 Bacteriuria: Secondary | ICD-10-CM

## 2024-04-10 NOTE — Progress Notes (Signed)
 US  32+4 wks,frank breech,small thick anterior fundal placenta (7 cm) gr 3,CX 4.5 cm,FHR 150 bpm,AFI 12 cm,SUA,EFW 1762 g 13%,AC 33%

## 2024-04-10 NOTE — Progress Notes (Signed)
 HIGH-RISK PREGNANCY VISIT Patient name: Katherine Mora MRN [id number]  Date of birth: 01/20/1996 Chief Complaint:   Routine Prenatal Visit  History of Present Illness:   Katherine Mora is a 28 y.o. G10P0090 female at [redacted]w[redacted]d with an Estimated Date of Delivery: 06/01/24 being seen today for ongoing management of a high-risk pregnancy complicated by     ICD-10-CM   1. Supervision of high risk pregnancy, antepartum  O09.90     2. Single umbilical artery  Q27.0      .    Today she reports no complaints. Contractions: Not present. Vag. Bleeding: None.  Movement: Present. denies leaking of fluid.      03/06/2024   10:03 AM 11/22/2023   10:40 AM 07/24/2023   11:45 AM 06/29/2023    1:35 PM 01/13/2023   10:36 AM  Depression screen PHQ 2/9  Decreased Interest 0 0 0 0 0  Down, Depressed, Hopeless 0 0 0 0 0  PHQ - 2 Score 0 0 0 0 0  Altered sleeping 1 2 2  0 1  Tired, decreased energy 1 1 2  0 1  Change in appetite 0 0 0 0 1  Feeling bad or failure about yourself  0 0 0 0 1  Trouble concentrating 1 2 0 0 0  Moving slowly or fidgety/restless 0 0 0 0 0  Suicidal thoughts 0 0 0 0 0  PHQ-9 Score 3 5 4  0 4  Difficult doing work/chores    Not difficult at all         11/22/2023   10:40 AM 07/24/2023   11:45 AM 06/29/2023    1:36 PM 01/13/2023   10:38 AM  GAD 7 : Generalized Anxiety Score  Nervous, Anxious, on Edge 2 0 0 1  Control/stop worrying 2 0 0 0  Worry too much - different things 1 1 0 0  Trouble relaxing 0 0 0 1  Restless 0 0 0 0  Easily annoyed or irritable 2 0 0 2  Afraid - awful might happen 0 0 0 0  Total GAD 7 Score 7 1 0 4  Anxiety Difficulty    Somewhat difficult     Review of Systems:   Pertinent items are noted in HPI Denies abnormal vaginal discharge w/ itching/odor/irritation, headaches, visual changes, shortness of breath, chest pain, abdominal pain, severe nausea/vomiting, or problems with urination or bowel movements unless otherwise stated  above. Pertinent History Reviewed:  Reviewed past medical,surgical, social, obstetrical and family history.  Reviewed problem list, medications and allergies. Physical Assessment:   Vitals:   04/10/24 1135  BP: 121/77  Pulse: 98  Weight: 237 lb (107.5 kg)  Body mass index is 40.68 kg/m.           Physical Examination:   General appearance: alert, well appearing, and in no distress  Mental status: alert, oriented to person, place, and time  Skin: warm & dry   Extremities:      Cardiovascular: normal heart rate noted  Respiratory: normal respiratory effort, no distress  Abdomen: gravid, soft, non-tender  Pelvic: Cervical exam deferred         Fetal Status:     Movement: Present    Fetal Surveillance Testing today: sonogram EFW `13% AC 33%   Chaperone: N/A    No results found for this or any previous visit (from the past 24 hours).  Assessment & Plan:  High-risk pregnancy: G10P0090 at [redacted]w[redacted]d with an Estimated Date of Delivery: 06/01/24  ICD-10-CM   1. Supervision of high risk pregnancy, antepartum  O09.90     2. Single umbilical artery  Q27.0          Meds: No orders of the defined types were placed in this encounter.   Orders: No orders of the defined types were placed in this encounter.    Labs/procedures today: U/S  Treatment Plan:  EFW 4 weeks    Follow-up: Return for keep scheduled.   Future Appointments  Date Time Provider Department Center  04/24/2024 11:10 AM Loreli Suzen BIRCH, CNM [facility]  04/30/2024 11:20 AM Tobb, Dub, DO [facility]  05/08/2024 10:45 AM [facility]  [facility] None  05/08/2024 11:30 AM Ozan, Jennifer, DO [facility]  07/08/2024 12:45 PM Whitfield Raisin, NP [facility] None  07/17/2024  1:00 PM Bevely Doffing, FNP [facility] [address]    No orders of the defined types were placed in this encounter.  Vonn VEAR Inch  Attending Physician for the Center for [facility] 04/10/2024 11:55 AM

## 2024-04-24 ENCOUNTER — Ambulatory Visit (INDEPENDENT_AMBULATORY_CARE_PROVIDER_SITE_OTHER): Admitting: Advanced Practice Midwife

## 2024-04-24 VITALS — BP 127/83 | HR 106 | Wt 241.0 lb

## 2024-04-24 DIAGNOSIS — O0990 Supervision of high risk pregnancy, unspecified, unspecified trimester: Secondary | ICD-10-CM

## 2024-04-24 DIAGNOSIS — Z3A34 34 weeks gestation of pregnancy: Secondary | ICD-10-CM

## 2024-04-24 DIAGNOSIS — O0993 Supervision of high risk pregnancy, unspecified, third trimester: Secondary | ICD-10-CM | POA: Diagnosis not present

## 2024-04-24 NOTE — Progress Notes (Signed)
 HIGH-RISK PREGNANCY VISIT Patient name: Katherine Mora MRN [id number]  Date of birth: 1995/11/23 Chief Complaint:   Routine Prenatal Visit  History of Present Illness:   Katherine Mora is a 28 y.o. G10P0090 female at [redacted]w[redacted]d with an Estimated Date of Delivery: 06/01/24 being seen today for ongoing management of a high-risk pregnancy complicated by single umbilical artery.    Today she reports decreased fetal movement. Contractions: Not present. Vag. Bleeding: None.  Movement: Present. denies leaking of fluid.      03/06/2024   10:03 AM 11/22/2023   10:40 AM 07/24/2023   11:45 AM 06/29/2023    1:35 PM 01/13/2023   10:36 AM  Depression screen PHQ 2/9  Decreased Interest 0 0 0 0 0  Down, Depressed, Hopeless 0 0 0 0 0  PHQ - 2 Score 0 0 0 0 0  Altered sleeping 1 2 2  0 1  Tired, decreased energy 1 1 2  0 1  Change in appetite 0 0 0 0 1  Feeling bad or failure about yourself  0 0 0 0 1  Trouble concentrating 1 2 0 0 0  Moving slowly or fidgety/restless 0 0 0 0 0  Suicidal thoughts 0 0 0 0 0  PHQ-9 Score 3 5 4  0 4  Difficult doing work/chores    Not difficult at all         11/22/2023   10:40 AM 07/24/2023   11:45 AM 06/29/2023    1:36 PM 01/13/2023   10:38 AM  GAD 7 : Generalized Anxiety Score  Nervous, Anxious, on Edge 2 0 0 1  Control/stop worrying 2 0 0 0  Worry too much - different things 1 1 0 0  Trouble relaxing 0 0 0 1  Restless 0 0 0 0  Easily annoyed or irritable 2 0 0 2  Afraid - awful might happen 0 0 0 0  Total GAD 7 Score 7 1 0 4  Anxiety Difficulty    Somewhat difficult     Review of Systems:   Pertinent items are noted in HPI Denies abnormal vaginal discharge w/ itching/odor/irritation, headaches, visual changes, shortness of breath, chest pain, abdominal pain, severe nausea/vomiting, or problems with urination or bowel movements unless otherwise stated above. Pertinent History Reviewed:  Reviewed past medical,surgical, social, obstetrical and family  history.  Reviewed problem list, medications and allergies. Physical Assessment:   Vitals:   04/24/24 1109  BP: 127/83  Pulse: (!) 106  Weight: 241 lb (109.3 kg)  Body mass index is 41.37 kg/m.           Physical Examination:   General appearance: alert, well appearing, and in no distress  Mental status: alert, oriented to person, place, and time  Skin: warm & dry   Extremities:      Cardiovascular: normal heart rate noted  Respiratory: normal respiratory effort, no distress  Abdomen: gravid, soft, non-tender  Pelvic: Cervical exam deferred         Fetal Status: Fetal Heart Rate (bpm): 130s NST   Movement: Present    Fetal Surveillance Testing today: NST baseline 130-140s, +accels, no decels, reactive, Cat 1; no ctx    No results found for this or any previous visit (from the past 24 hours).  Assessment & Plan:  High-risk pregnancy: G10P0090 at [redacted]w[redacted]d with an Estimated Date of Delivery: 06/01/24   1) SUA, stable with EFW 13% @ 32wks; has next EFW with next visit 11/5  2) Wants waterbirth, certificate under  media; if becomes FGR, will risk out  3) GBS bacteriuria, will get swab @ next visit for sensitivities due to PCN allergy   4) Decreased FM, she hasn't been awake long enough to eval FM this morning, but NST done due to concern; rev'd FKCs and what to expect  Meds: No orders of the defined types were placed in this encounter.   Labs/procedures today: NST  Treatment Plan:  EFW q 4wks  Reviewed: Preterm labor symptoms and general obstetric precautions including but not limited to vaginal bleeding, contractions, leaking of fluid and fetal movement were reviewed in detail with the patient.  All questions were answered. Does have home bp cuff. Office bp cuff given: not applicable. Check bp weekly, let us  know if consistently >140 and/or >90.  Follow-up: Return for As scheduled (GBS for sensitivities @ next visit).   Future Appointments  Date Time Provider Department  Center  04/30/2024 11:20 AM Tobb, Kardie, DO [facility] H[facility]  05/08/2024 10:45 AM [facility]  [facility] None  05/08/2024 11:30 AM Marilynn Nest, DO [facility]  05/15/2024 11:10 AM Loreli Suzen BIRCH, CNM [facility]  05/22/2024 11:10 AM Loreli Suzen BIRCH, CNM [facility]  05/27/2024  2:10 PM Jayne Vonn DEL, MD [facility]  07/08/2024 12:45 PM Whitfield Raisin, NP [facility] None  07/17/2024  1:00 PM Bevely Doffing, FNP [facility] [address]    No orders of the defined types were placed in this encounter.  Suzen BIRCH Loreli CNM 04/24/2024 12:14 PM

## 2024-04-26 ENCOUNTER — Encounter: Payer: Self-pay | Admitting: *Deleted

## 2024-04-30 ENCOUNTER — Encounter: Payer: Self-pay | Admitting: Cardiology

## 2024-04-30 ENCOUNTER — Ambulatory Visit: Attending: Cardiology | Admitting: Cardiology

## 2024-04-30 VITALS — BP 112/68 | HR 108 | Ht 64.0 in | Wt 245.4 lb

## 2024-04-30 DIAGNOSIS — I493 Ventricular premature depolarization: Secondary | ICD-10-CM | POA: Insufficient documentation

## 2024-04-30 DIAGNOSIS — Z3A35 35 weeks gestation of pregnancy: Secondary | ICD-10-CM | POA: Insufficient documentation

## 2024-04-30 NOTE — Progress Notes (Unsigned)
 Cardio-Obstetrics Clinic  New Evaluation  Date:  05/03/2024   ID:  Katherine Mora, DOB [birth year], MRN [id number]  PCP:  Terry Wilhelmena Lloyd Hilario, FNP   [facility] Providers Cardiologist:  Redell Shallow, MD  Electrophysiologist:  None       Referring MD: Del Orbe Polanco, Ilian*   Chief Complaint:  I am oK  History of Present Illness:    Katherine Mora is a 28 y.o. female [G10P0090] who is being seen today for the evaluation of palpitations at the request of Del Orbe Polanco, Ilian*.   Medical hx shows rare evidence of PVC, Fibromyalgia, Bipolar, Asthma, anxiety and migraines.   At her initial visit with me in cardio obstetrics clinic she was being seen significant palpitations with known PVCs.  I placed a monitor on the patient.  She did wear a monitor we did not show any significant arrhythmia.  She is here today with her mom.  Prior CV Studies Reviewed: The following studies were reviewed today: None   Past Medical History:  Diagnosis Date   Anxiety    Asthma    bipolar 1    Bipolar   Eczema    Fibromyalgia 2018   Heart palpitations    History of kidney stones    Hypoglycemia    Migraines    PONV (postoperative nausea and vomiting)     Past Surgical History:  Procedure Laterality Date   d and c N/A 2017   14 week size   DILATION AND EVACUATION N/A 08/01/2023   Procedure: DILATATION AND EVACUATION;  Surgeon: Jayne Vonn DEL, MD;  Location: [facility];  Service: Gynecology;  Laterality: N/A;   NASAL SEPTUM SURGERY Bilateral 2015   SINOSCOPY        OB History     Gravida  10   Para      Term      Preterm      AB  9   Living         SAB  9   IAB      Ectopic      Multiple      Live Births                  Current Medications: Current Meds  Medication Sig   cetirizine  (ZYRTEC ) 10 MG tablet TAKE 1 TO 2 TABLETS BY MOUTH DAILY   EPINEPHrine  (EPIPEN  2-PAK) 0.3 mg/0.3 mL IJ SOAJ injection Inject 0.3 mg into the  muscle as needed for anaphylaxis.   MAGNESIUM PO Take 483 mg by mouth daily.   pantoprazole  (PROTONIX ) 20 MG tablet Take 1 tablet (20 mg total) by mouth daily.   Prenatal Vit-Fe Fumarate-FA (PRENATAL VITAMIN PO) Take 1 tablet by mouth daily.   progesterone  (PROMETRIUM ) 200 MG capsule PLACE 1 CAPSULE IN VAGINA AT BEDTIME NIGHTLY   VENTOLIN  HFA 108 (90 Base) MCG/ACT inhaler Inhale 2 puffs into the lungs every 4 (four) hours as needed for wheezing or shortness of breath.   zafirlukast  (ACCOLATE ) 10 MG tablet TAKE 1 TABLET BY MOUTH TWICE A DAY.     Allergies:   Fentanyl, Mometasone furo-formoterol fum, Montelukast, Penicillins, Pitocin [oxytocin], Tricyclic antidepressants, Venlafaxine hcl, Clindamycin, Latex, and Prednisone   Social History   Socioeconomic History   Marital status: Single    Spouse name: Not on file   Number of children: 0   Years of education: Not on file   Highest education level: Some college, no degree  Occupational History  Not on file  Tobacco Use   Smoking status: Former    Types: Cigarettes    Passive exposure: Current   Smokeless tobacco: Never  Vaping Use   Vaping status: Former  Substance and Sexual Activity   Alcohol use: Not Currently   Drug use: Not Currently   Sexual activity: Yes    Birth control/protection: None  Other Topics Concern   Not on file  Social History Narrative   Right handed   Drinks soda (rare)   Waiting to get glasses    Social Drivers of Health   Financial Resource Strain: Medium Risk (07/24/2023)   Overall Financial Resource Strain (CARDIA)    Difficulty of Paying Living Expenses: Somewhat hard  Food Insecurity: No Food Insecurity (05/03/2024)   Hunger Vital Sign    Worried About Running Out of Food in the Last Year: Never true    Ran Out of Food in the Last Year: Never true  Transportation Needs: No Transportation Needs (05/03/2024)   PRAPARE - Administrator, Civil Service (Medical): No    Lack of  Transportation (Non-Medical): No  Physical Activity: Insufficiently Active (07/24/2023)   Exercise Vital Sign    Days of Exercise per Week: 3 days    Minutes of Exercise per Session: 30 min  Stress: No Stress Concern Present (07/24/2023)   [employer] of Occupational Health - Occupational Stress Questionnaire    Feeling of Stress : Only a little  Social Connections: Moderately Isolated (07/24/2023)   Social Connection and Isolation Panel    Frequency of Communication with Friends and Family: Twice a week    Frequency of Social Gatherings with Friends and Family: Once a week    Attends Religious Services: 1 to 4 times per year    Active Member of [employer] or Organizations: No    Attends Engineer, Structural: Never    Marital Status: Never married      Family History  Problem Relation Age of Onset   Heart attack Father    Diabetes Father    Eczema Brother    Allergic rhinitis Brother    Urticaria Brother    Cancer Paternal Aunt        brain   Sarcoidosis Maternal Grandmother    Kidney failure Maternal Grandmother    Cancer Maternal Grandfather        skin   Colon cancer Neg Hx    Colon polyps Neg Hx       ROS:   Please see the history of present illness.     All other systems reviewed and are negative.   Labs/EKG Reviewed:    EKG:   EKG today shows sinus rhythm with sinus arrhythmia   Recent Labs: 02/23/2024: ALT 20; BUN 8; Creatinine, Ser 0.54; Potassium 4.1; Sodium 137; TSH 2.576 03/06/2024: Hemoglobin 13.1; Platelets 205   Recent Lipid Panel Lab Results  Component Value Date/Time   CHOL 190 11/11/2022 03:53 PM   TRIG 147 11/11/2022 03:53 PM   HDL 57 11/11/2022 03:53 PM   CHOLHDL 3.3 11/11/2022 03:53 PM   LDLCALC 107 (H) 11/11/2022 03:53 PM    Physical Exam:    VS:  BP 112/68 (BP Location: Left Arm, Patient Position: Sitting, Cuff Size: Large)   Pulse (!) 108   Ht 5' 4 (1.626 m)   Wt 245 lb 6.4 oz (111.3 kg)   LMP  (LMP Unknown)   SpO2 97%    BMI 42.12 kg/m     Wt  Readings from Last 3 Encounters:  05/03/24 244 lb (110.7 kg)  04/30/24 245 lb 6.4 oz (111.3 kg)  04/24/24 241 lb (109.3 kg)     GEN:  Well nourished, well developed in no acute distress HEENT: Normal NECK: No JVD; No carotid bruits LYMPHATICS: No lymphadenopathy CARDIAC: RRR, no murmurs, rubs, gallops RESPIRATORY:  Clear to auscultation without rales, wheezing or rhonchi  ABDOMEN: Soft, non-tender, non-distended MUSCULOSKELETAL:  No edema; No deformity  SKIN: Warm and dry NEUROLOGIC:  Alert and oriented x 3 PSYCHIATRIC:  Normal affect    Risk Assessment/Risk Calculators:                  ASSESSMENT & PLAN:    PVC now with palpitations 35 weeks of gestation    She is doing well.  No significant blood pressure elevation thankfully on her monitor there was no arrhythmia.  She will follow-up     Patient Instructions  Medication Instructions:   No changes  *If you need a refill on your cardiac medications before your next appointment, please call your pharmacy*   Lab Work: Not needed    Testing/Procedures: Not needed   Follow-Up: At [facility], you and your health needs are our priority.  As part of our continuing mission to provide you with exceptional heart care, we have created designated Provider Care Teams.  These Care Teams include your primary Cardiologist (physician) and Advanced Practice Providers (APPs -  Physician Assistants and Nurse Practitioners) who all work together to provide you with the care you need, when you need it.     Your next appointment:   As needed    The format for your next appointment:   In Person  Provider:   Dr Crist Huntsman     Dispo:  Return if symptoms worsen or fail to improve.   Medication Adjustments/Labs and Tests Ordered: Current medicines are reviewed at length with the patient today.  Concerns regarding medicines are outlined above.  Tests Ordered: No orders of the defined  types were placed in this encounter.  Medication Changes: No orders of the defined types were placed in this encounter. 0

## 2024-04-30 NOTE — Patient Instructions (Signed)
 Medication Instructions:   No changes  *If you need a refill on your cardiac medications before your next appointment, please call your pharmacy*   Lab Work: Not needed    Testing/Procedures: Not needed   Follow-Up: At [facility], you and your health needs are our priority.  As part of our continuing mission to provide you with exceptional heart care, we have created designated Provider Care Teams.  These Care Teams include your primary Cardiologist (physician) and Advanced Practice Providers (APPs -  Physician Assistants and Nurse Practitioners) who all work together to provide you with the care you need, when you need it.     Your next appointment:   As needed    The format for your next appointment:   In Person  Provider:   Dr Crist Huntsman

## 2024-05-03 ENCOUNTER — Encounter (HOSPITAL_COMMUNITY): Payer: Self-pay | Admitting: Obstetrics and Gynecology

## 2024-05-03 ENCOUNTER — Inpatient Hospital Stay (HOSPITAL_COMMUNITY)
Admission: AD | Admit: 2024-05-03 | Discharge: 2024-05-03 | Disposition: A | Discharge status: Home / Self Care | Attending: Obstetrics and Gynecology | Admitting: Obstetrics and Gynecology

## 2024-05-03 ENCOUNTER — Other Ambulatory Visit: Payer: Self-pay

## 2024-05-03 DIAGNOSIS — Z3A35 35 weeks gestation of pregnancy: Secondary | ICD-10-CM | POA: Diagnosis not present

## 2024-05-03 DIAGNOSIS — O26893 Other specified pregnancy related conditions, third trimester: Secondary | ICD-10-CM | POA: Diagnosis not present

## 2024-05-03 DIAGNOSIS — Z3689 Encounter for other specified antenatal screening: Secondary | ICD-10-CM

## 2024-05-03 DIAGNOSIS — O0993 Supervision of high risk pregnancy, unspecified, third trimester: Secondary | ICD-10-CM | POA: Insufficient documentation

## 2024-05-03 DIAGNOSIS — N939 Abnormal uterine and vaginal bleeding, unspecified: Secondary | ICD-10-CM | POA: Diagnosis not present

## 2024-05-03 DIAGNOSIS — R8271 Bacteriuria: Secondary | ICD-10-CM

## 2024-05-03 DIAGNOSIS — Q270 Congenital absence and hypoplasia of umbilical artery: Secondary | ICD-10-CM

## 2024-05-03 DIAGNOSIS — O4693 Antepartum hemorrhage, unspecified, third trimester: Secondary | ICD-10-CM | POA: Insufficient documentation

## 2024-05-03 DIAGNOSIS — O0990 Supervision of high risk pregnancy, unspecified, unspecified trimester: Secondary | ICD-10-CM

## 2024-05-03 LAB — URINALYSIS, ROUTINE W REFLEX MICROSCOPIC
Bacteria, UA: NONE SEEN
Bilirubin Urine: NEGATIVE
Glucose, UA: NEGATIVE mg/dL
Ketones, ur: NEGATIVE mg/dL
Leukocytes,Ua: NEGATIVE
Nitrite: NEGATIVE
Protein, ur: NEGATIVE mg/dL
Specific Gravity, Urine: 1.016 (ref 1.005–1.030)
pH: 6 (ref 5.0–8.0)

## 2024-05-03 LAB — WET PREP, GENITAL
Clue Cells Wet Prep HPF POC: NONE SEEN
Sperm: NONE SEEN
Trich, Wet Prep: NONE SEEN
WBC, Wet Prep HPF POC: <10 (ref <10)
Yeast Wet Prep HPF POC: NONE SEEN

## 2024-05-03 NOTE — Discharge Instructions (Signed)
 We are so glad you are feeling better and your vaginal spotting has resolved!   General instructions: Do not use tampons. Do not douche. Keep all follow-up visits.  Come back to the ED for: You have cramps or bleeding. You have a fever. You have very bad cramps or pain in your back or belly. Your bleeding gets worse. You pass large clots or a lot of tissue from your vagina. You feel light-headed or weak. You faint. Your baby is moving less than usual, or not moving at all. You feel the need to be seen in the ED

## 2024-05-03 NOTE — MAU Provider Note (Addendum)
 Chief Complaint:  Vaginal Bleeding   HPI  Katherine Mora is a 28 y.o. G10P0090 at [redacted]w[redacted]d who presents to maternity admissions reporting mild vaginal bleeding. Patient states she noticed bright red blood on 4 toilet tissues when wiping this morning around 0700. She placed a pad but has not noticed any blood since then. Patient reports a stressful week with her being on her feet more than normal, dealing with car issues and back and forth visits to the vet with her 2 dogs. She did have intercourse last night. She endorses constant abdominal pain to the epigastric area and intermittent abdominal pain to her lower abdomen she attributes to fetal movement and fibromyalgia. Denies contractions, N/V/D, recent sick contacts, CP, SOB, LE edema, decreased fetal movement, vaginal discharge, large gush of blood, leakage or gush of fluid, or any other complaints at this time.   Past Medical History:  Diagnosis Date   Anxiety    Asthma    bipolar 1    Bipolar   Eczema    Fibromyalgia 2018   Heart palpitations    History of kidney stones    Hypoglycemia    Migraines    PONV (postoperative nausea and vomiting)    OB History  Gravida Para Term Preterm AB Living  10    9   SAB IAB Ectopic Multiple Live Births  9        # Outcome Date GA Lbr Len/2nd Weight Sex Type Anes PTL Lv  10 Current           9 SAB 07/2023          8 SAB 10/12/21          7 SAB 2017 [redacted]w[redacted]d         6 SAB           5 SAB           4 SAB           3 SAB           2 SAB           1 SAB            Past Surgical History:  Procedure Laterality Date   d and c N/A 2017   14 week size   DILATION AND EVACUATION N/A 08/01/2023   Procedure: DILATATION AND EVACUATION;  Surgeon: Jayne Vonn DEL, MD;  Location: [facility];  Service: Gynecology;  Laterality: N/A;   NASAL SEPTUM SURGERY Bilateral 2015   SINOSCOPY     Family History  Problem Relation Age of Onset   Heart attack Father    Diabetes Father    Eczema Brother    Allergic  rhinitis Brother    Urticaria Brother    Cancer Paternal Aunt        brain   Sarcoidosis Maternal Grandmother    Kidney failure Maternal Grandmother    Cancer Maternal Grandfather        skin   Colon cancer Neg Hx    Colon polyps Neg Hx    Social History   Tobacco Use   Smoking status: Former    Types: Cigarettes    Passive exposure: Current   Smokeless tobacco: Never  Vaping Use   Vaping status: Former  Substance Use Topics   Alcohol use: Not Currently   Drug use: Not Currently   Allergies  Allergen Reactions   Fentanyl Shortness Of Breath and Rash    lethargic  Mometasone Furo-Formoterol Fum Shortness Of Breath and Nausea And Vomiting    Asthma   Montelukast Hives and Shortness Of Breath   Penicillins Anaphylaxis    Family History    Pitocin [Oxytocin] Hives    Family history, grandma anaplaxis, mom rash and hives   Tricyclic Antidepressants Other (See Comments)    Panic Attacks, Asthma Flare    Venlafaxine Hcl Other (See Comments)    Blurred vision, feeling faint   Clindamycin Rash   Latex Rash   Prednisone Anxiety and Rash   Medications Prior to Admission  Medication Sig Dispense Refill Last Dose/Taking   cetirizine  (ZYRTEC ) 10 MG tablet TAKE 1 TO 2 TABLETS BY MOUTH DAILY 60 tablet 0 05/02/2024   MAGNESIUM PO Take 483 mg by mouth daily.   05/02/2024   pantoprazole  (PROTONIX ) 20 MG tablet Take 1 tablet (20 mg total) by mouth daily. 30 tablet 6 05/02/2024   Prenatal Vit-Fe Fumarate-FA (PRENATAL VITAMIN PO) Take 1 tablet by mouth daily.   05/02/2024   progesterone  (PROMETRIUM ) 200 MG capsule PLACE 1 CAPSULE IN VAGINA AT BEDTIME NIGHTLY 90 capsule 1 05/02/2024   VENTOLIN  HFA 108 (90 Base) MCG/ACT inhaler Inhale 2 puffs into the lungs every 4 (four) hours as needed for wheezing or shortness of breath.   Past Week   zafirlukast  (ACCOLATE ) 10 MG tablet TAKE 1 TABLET BY MOUTH TWICE A DAY. 60 tablet 0 05/02/2024   acetaminophen  (TYLENOL ) 500 MG tablet Take 1,000 mg by  mouth every 6 (six) hours as needed for moderate pain (pain score 4-6). (Patient not taking: Reported on 04/30/2024)      aspirin  81 MG chewable tablet Chew 2 tablets (162 mg total) by mouth daily. (Patient not taking: Reported on 04/30/2024) 60 tablet 7    Elastic Bandages & Supports (COMFORT FIT MATERNITY SUPP LG) MISC 1 Device by Does not apply route daily. (Patient not taking: Reported on 04/30/2024) 1 each 0    EPINEPHrine  (EPIPEN  2-PAK) 0.3 mg/0.3 mL IJ SOAJ injection Inject 0.3 mg into the muscle as needed for anaphylaxis. 2 each 2    glucose blood test strip Use as instructed to check blood sugar four times daily (Patient not taking: Reported on 04/30/2024) 100 each 12     I have reviewed patient's Past Medical Hx, Surgical Hx, Family Hx, Social Hx, medications and allergies.   ROS  Pertinent items noted in HPI and remainder of comprehensive ROS otherwise negative.   PHYSICAL EXAM  Patient Vitals for the past 24 hrs:  BP Temp Temp src Pulse Resp SpO2 Height Weight  05/03/24 0924 118/82 98.2 F (36.8 C) Oral 95 16 96 % -- --  05/03/24 9077 -- -- -- -- -- -- 5' 4 (1.626 m) 110.7 kg    Constitutional: Well-developed, obese female in no acute distress but appears anxious  Cardiovascular: normal rate & rhythm, warm and well-perfused, radial pulses 2+ Respiratory: normal effort, no problems with respiration noted, good air movement throughout all lung fields GI: Abd soft, non-tender, gravid uterus MS: Extremities nontender, no edema, normal ROM Neurologic: Alert and oriented x 4  Pelvic: normal appearing external genitalia without lesions. Vagina is moist with physiologic discharge. Cervix closed.  Chaperone present: Jeoffrey Gal, RN   Fetal Tracing: Reactive NST for GA Baseline: 130-140s FHR Variability: present Accelerations: present Decelerations: none Toco: 20 mmHg   Labs: Results for orders placed or performed during the hospital encounter of 05/03/24 (from the past 24  hours)  Urinalysis, Routine w reflex microscopic -Urine,  Random     Status: Abnormal   Collection Time: 05/03/24 10:25 AM  Result Value Ref Range   Color, Urine YELLOW YELLOW   APPearance CLEAR CLEAR   Specific Gravity, Urine 1.016 1.005 - 1.030   pH 6.0 5.0 - 8.0   Glucose, UA NEGATIVE NEGATIVE mg/dL   Hgb urine dipstick SMALL (A) NEGATIVE   Bilirubin Urine NEGATIVE NEGATIVE   Ketones, ur NEGATIVE NEGATIVE mg/dL   Protein, ur NEGATIVE NEGATIVE mg/dL   Nitrite NEGATIVE NEGATIVE   Leukocytes,Ua NEGATIVE NEGATIVE   RBC / HPF 0-5 0 - 5 RBC/hpf   WBC, UA 0-5 0 - 5 WBC/hpf   Bacteria, UA NONE SEEN NONE SEEN   Squamous Epithelial / HPF 0-5 0 - 5 /HPF   Mucus PRESENT   Wet prep, genital     Status: None   Collection Time: 05/03/24 10:37 AM   Specimen: PATH Cytology Cervicovaginal Ancillary Only  Result Value Ref Range   Yeast Wet Prep HPF POC NONE SEEN NONE SEEN   Trich, Wet Prep NONE SEEN NONE SEEN   Clue Cells Wet Prep HPF POC NONE SEEN NONE SEEN   WBC, Wet Prep HPF POC <10 <10   Sperm NONE SEEN    MDM & MAU COURSE  MDM:   HIGH   MAU Course: Orders Placed This Encounter  Procedures   Wet prep, genital   Culture, OB Urine   Urinalysis, Routine w reflex microscopic -Urine, Random   Discharge patient   ASSESSMENT   1. Vaginal spotting   2. Supervision of high risk pregnancy, antepartum   3. GBS bacteriuria   4. Single umbilical artery   5. [redacted] weeks gestation of pregnancy   6. NST (non-stress test) reactive    28yo F G10P0 [redacted]w[redacted]d presents with primary complaint of vaginal spotting with bright red blood on toilet tissue this morning. Patient noted some blood on tissue after providing urine sample here however has not had any blood noted on feminine hygiene pad she placed on the way to MAU. Denies contractions, large amount of bleeding, leakage of fluid. H/o subchorionic hematoma early in pregnancy, resolved. No h/o placenta previa, low likelihood without US  confirmation of  presence. NST appropriate and reassuring. UA and wet prep unremarkable. Reassured patient and FOB who verbalize understanding and are agreeable with d/c plan.  PLAN  Discharge home in stable condition with return precautions.    OB F/U as scheduled   Allergies as of 05/03/2024       Reactions   Fentanyl Shortness Of Breath, Rash   lethargic   Mometasone Furo-formoterol Fum Shortness Of Breath, Nausea And Vomiting   Asthma   Montelukast Hives, Shortness Of Breath   Penicillins Anaphylaxis   Family History    Pitocin [oxytocin] Hives   Family history, grandma anaplaxis, mom rash and hives   Tricyclic Antidepressants Other (See Comments)   Panic Attacks, Asthma Flare    Venlafaxine Hcl Other (See Comments)   Blurred vision, feeling faint   Clindamycin Rash   Latex Rash   Prednisone Anxiety, Rash        Medication List     STOP taking these medications    aspirin  81 MG chewable tablet       TAKE these medications    acetaminophen  500 MG tablet Commonly known as: TYLENOL  Take 1,000 mg by mouth every 6 (six) hours as needed for moderate pain (pain score 4-6).   cetirizine  10 MG tablet Commonly known as: ZYRTEC  TAKE 1  TO 2 TABLETS BY MOUTH DAILY   Comfort Fit Maternity Supp Lg Misc 1 Device by Does not apply route daily.   EPINEPHrine  0.3 mg/0.3 mL Soaj injection Commonly known as: EpiPen  2-Pak Inject 0.3 mg into the muscle as needed for anaphylaxis.   glucose blood test strip Use as instructed to check blood sugar four times daily   MAGNESIUM PO Take 483 mg by mouth daily.   pantoprazole  20 MG tablet Commonly known as: Protonix  Take 1 tablet (20 mg total) by mouth daily.   PRENATAL VITAMIN PO Take 1 tablet by mouth daily.   progesterone  200 MG capsule Commonly known as: PROMETRIUM  PLACE 1 CAPSULE IN VAGINA AT BEDTIME NIGHTLY   Ventolin  HFA 108 (90 Base) MCG/ACT inhaler Generic drug: albuterol  Inhale 2 puffs into the lungs every 4 (four) hours as  needed for wheezing or shortness of breath.   zafirlukast  10 MG tablet Commonly known as: ACCOLATE  TAKE 1 TABLET BY MOUTH TWICE A DAY.       Camie Dixons, DO PGY-1 Family Medicine Resident [facility]  Attestation of Supervision of Student:  I confirm that I have verified the information documented in the Resident Physician's  student's note and that I have also personally reperformed the history, physical exam and all medical decision making activities.  I have verified that all services and findings are accurately documented in this student's note; and I agree with management and plan as outlined in the documentation. I have also made any necessary editorial changes.   I personally confirmed the HPI, Physical exam, and performed the BSUS for maternal reassurance. No evidence of SROM at this time   Pt informed that the ultrasound is considered a limited OB ultrasound and is not intended to be a complete ultrasound exam.  Patient also informed that the ultrasound is not being completed with the intent of assessing for fetal or placental anomalies or any pelvic abnormalities.  Explained that the purpose of today's ultrasound is to assess for  maternal reassurance  and presentation.  Patient acknowledges the purpose of the exam and the limitations of the study.    Dempsey Breech presentation, good FM's observed and visually adequate AFI   Olam DELENA Dalton, NP Center for [employer], [facility]

## 2024-05-03 NOTE — MAU Note (Addendum)
 Katherine Mora is a 28 y.o. at [redacted]w[redacted]d here in MAU reporting: VB since early this morning and occ contractions. She reports some but less FMs, No LOF, no blurry vision, headaches, peripheral edema, or RUQ pain. Breech presentation at last US .   LMP:  Onset of complaint: early this morning Pain score: 5/10 Vitals:   05/03/24 0924  BP: 118/82  Pulse: 95  Resp: 16  Temp: 98.2 F (36.8 C)  SpO2: 96%     FHT: 145  Lab orders placed from triage: ua

## 2024-05-04 LAB — CULTURE, OB URINE: Culture: 10000 — AB

## 2024-05-05 ENCOUNTER — Other Ambulatory Visit: Payer: Self-pay

## 2024-05-05 ENCOUNTER — Telehealth: Payer: Self-pay

## 2024-05-05 ENCOUNTER — Ambulatory Visit: Payer: Self-pay | Admitting: Obstetrics and Gynecology

## 2024-05-05 DIAGNOSIS — O0990 Supervision of high risk pregnancy, unspecified, unspecified trimester: Secondary | ICD-10-CM

## 2024-05-05 DIAGNOSIS — O2343 Unspecified infection of urinary tract in pregnancy, third trimester: Secondary | ICD-10-CM

## 2024-05-05 MED ORDER — NITROFURANTOIN MONOHYD MACRO 100 MG PO CAPS: 100.0000 mg | ORAL_CAPSULE | Freq: Two times a day (BID) | ORAL | 1 refills | Status: DC

## 2024-05-05 NOTE — Telephone Encounter (Signed)
 Following up on positive GBS cx. Fm hx of PCN allergy . Nitrofurantoin sent to pt's pharmacy.

## 2024-05-06 LAB — GC/CHLAMYDIA PROBE AMP (~~LOC~~) NOT AT ARMC
Chlamydia: NEGATIVE
Comment: NEGATIVE
Comment: NORMAL
Neisseria Gonorrhea: NEGATIVE

## 2024-05-08 ENCOUNTER — Ambulatory Visit (INDEPENDENT_AMBULATORY_CARE_PROVIDER_SITE_OTHER): Admitting: Obstetrics & Gynecology

## 2024-05-08 ENCOUNTER — Other Ambulatory Visit: Payer: Self-pay | Admitting: Advanced Practice Midwife

## 2024-05-08 ENCOUNTER — Encounter (HOSPITAL_COMMUNITY): Payer: Self-pay | Admitting: *Deleted

## 2024-05-08 ENCOUNTER — Telehealth (HOSPITAL_COMMUNITY): Payer: Self-pay | Admitting: *Deleted

## 2024-05-08 ENCOUNTER — Other Ambulatory Visit

## 2024-05-08 ENCOUNTER — Encounter: Payer: Self-pay | Admitting: Obstetrics & Gynecology

## 2024-05-08 VITALS — BP 122/78 | HR 91 | Wt 244.6 lb

## 2024-05-08 DIAGNOSIS — O99820 Streptococcus B carrier state complicating pregnancy: Secondary | ICD-10-CM

## 2024-05-08 DIAGNOSIS — R8271 Bacteriuria: Secondary | ICD-10-CM

## 2024-05-08 DIAGNOSIS — O0990 Supervision of high risk pregnancy, unspecified, unspecified trimester: Secondary | ICD-10-CM | POA: Diagnosis not present

## 2024-05-08 DIAGNOSIS — O321XX0 Maternal care for breech presentation, not applicable or unspecified: Secondary | ICD-10-CM

## 2024-05-08 DIAGNOSIS — R000 Tachycardia, unspecified: Secondary | ICD-10-CM | POA: Diagnosis not present

## 2024-05-08 DIAGNOSIS — O99323 Drug use complicating pregnancy, third trimester: Secondary | ICD-10-CM

## 2024-05-08 DIAGNOSIS — O43193 Other malformation of placenta, third trimester: Secondary | ICD-10-CM | POA: Diagnosis not present

## 2024-05-08 DIAGNOSIS — F3170 Bipolar disorder, currently in remission, most recent episode unspecified: Secondary | ICD-10-CM | POA: Diagnosis not present

## 2024-05-08 DIAGNOSIS — Z3A36 36 weeks gestation of pregnancy: Secondary | ICD-10-CM | POA: Diagnosis not present

## 2024-05-08 DIAGNOSIS — Q270 Congenital absence and hypoplasia of umbilical artery: Secondary | ICD-10-CM

## 2024-05-08 DIAGNOSIS — O365990 Maternal care for other known or suspected poor fetal growth, unspecified trimester, not applicable or unspecified: Secondary | ICD-10-CM

## 2024-05-08 DIAGNOSIS — F1910 Other psychoactive substance abuse, uncomplicated: Secondary | ICD-10-CM

## 2024-05-08 DIAGNOSIS — O99891 Other specified diseases and conditions complicating pregnancy: Secondary | ICD-10-CM

## 2024-05-08 DIAGNOSIS — F411 Generalized anxiety disorder: Secondary | ICD-10-CM | POA: Diagnosis not present

## 2024-05-08 LAB — OB RESULTS CONSOLE GBS: GBS: POSITIVE

## 2024-05-08 NOTE — Telephone Encounter (Signed)
 Preadmission screen

## 2024-05-08 NOTE — Progress Notes (Signed)
 HIGH-RISK PREGNANCY VISIT Patient name: Katherine Mora MRN [id number]  Date of birth: [birth year] Chief Complaint:   Routine Prenatal Visit  History of Present Illness:   Katherine Mora is a 28 y.o. G10P0090 female at [redacted]w[redacted]d with an Estimated Date of Delivery: 06/01/24 being seen today for ongoing management of a high-risk pregnancy complicated by;  -SUA -Tachycardia s/p cards consult -GBS bacteruria -Dempsey breech presentation  Today she reports no complaints.   Contractions: Irregular. Vag. Bleeding: None.  Movement: Present. denies leaking of fluid.      03/06/2024   10:03 AM 11/22/2023   10:40 AM 07/24/2023   11:45 AM 06/29/2023    1:35 PM 01/13/2023   10:36 AM  Depression screen PHQ 2/9  Decreased Interest 0 0 0 0 0  Down, Depressed, Hopeless 0 0 0 0 0  PHQ - 2 Score 0 0 0 0 0  Altered sleeping 1 2 2  0 1  Tired, decreased energy 1 1 2  0 1  Change in appetite 0 0 0 0 1  Feeling bad or failure about yourself  0 0 0 0 1  Trouble concentrating 1 2 0 0 0  Moving slowly or fidgety/restless 0 0 0 0 0  Suicidal thoughts 0 0 0 0 0  PHQ-9 Score 3 5 4  0 4  Difficult doing work/chores    Not difficult at all      Current Outpatient Medications  Medication Instructions   cetirizine  (ZYRTEC ) 10-20 mg, Oral, Daily   EPINEPHrine  (EPIPEN  2-PAK) 0.3 mg, Intramuscular, As needed   MAGNESIUM PO 483 mg, Daily   nitrofurantoin (macrocrystal-monohydrate) (MACROBID) 100 mg, Oral, 2 times daily   pantoprazole  (PROTONIX ) 20 mg, Oral, Daily   Prenatal Vit-Fe Fumarate-FA (PRENATAL VITAMIN PO) 1 tablet, Daily   progesterone  (PROMETRIUM ) 200 MG capsule PLACE 1 CAPSULE IN VAGINA AT BEDTIME NIGHTLY   VENTOLIN  HFA 108 (90 Base) MCG/ACT inhaler 2 puffs, Every 4 hours PRN   zafirlukast  (ACCOLATE ) 10 MG tablet TAKE 1 TABLET BY MOUTH TWICE A DAY.     Review of Systems:   Pertinent items are noted in HPI Denies abnormal vaginal discharge w/ itching/odor/irritation, headaches, visual  changes, shortness of breath, chest pain, abdominal pain, severe nausea/vomiting, or problems with urination or bowel movements unless otherwise stated above. Pertinent History Reviewed:  Reviewed past medical,surgical, social, obstetrical and family history.  Reviewed problem list, medications and allergies. Physical Assessment:   Vitals:   05/08/24 1153  BP: 122/78  Pulse: 91  Weight: 244 lb 9.6 oz (110.9 kg)  Body mass index is 41.99 kg/m.           Physical Examination:   General appearance: alert, well appearing, and in no distress  Mental status: normal mood, behavior, speech, dress, motor activity, and thought processes  Skin: warm & dry   Extremities: Edema: None    Cardiovascular: normal heart rate noted  Respiratory: normal respiratory effort, no distress  Abdomen: gravid, soft, non-tender  Pelvic: Cervical exam deferred  GBS obtained       Fetal Status:     Movement: Present    Fetal Surveillance Testing today:  frank breech,short thick placenta fundal gr 3,FHR 148 bpm,BPP 8/8,SUA,AFI 12 cm,RI .53,.57=24%,EFW 2433 g 10%   Chaperone: Latisha Cresenzo    No results found for this or any previous visit (from the past 24 hours).   Assessment & Plan:  High-risk pregnancy: G10P0090 at [redacted]w[redacted]d with an Estimated Date of Delivery: 06/01/24   1) SUA -  Growth today 10% which is above cutoff for FGR 2) Tachycardia s/p cards consult 3) GBS bacteruria  4) Frank breech presentation - Reviewed ECV versus primary C-section - Discussed risk benefits - Patient desires ECV, scheduled for November 12 at 8 AM, patient called with this information -orders placed  Meds: No orders of the defined types were placed in this encounter.   Labs/procedures today: GBS  Treatment Plan:  routine OB care and as outlined above  Reviewed: Preterm labor symptoms and general obstetric precautions including but not limited to vaginal bleeding, contractions, leaking of fluid and fetal movement  were reviewed in detail with the patient.  All questions were answered.   Follow-up: Return in about 1 week (around 05/15/2024) for LROB visit.   Future Appointments  Date Time Provider Department Center  05/15/2024  8:00 AM [facility] SCHED ROOM [facility] None  05/15/2024 11:10 AM Loreli Suzen BIRCH, CNM [facility]  05/22/2024 11:10 AM Loreli Suzen BIRCH, CNM [facility]  05/27/2024  2:10 PM Jayne Vonn DEL, MD [facility]  07/08/2024 12:45 PM Whitfield Raisin, NP [facility] None  07/17/2024  1:00 PM Bevely Doffing, FNP [facility] [id number]    Orders Placed This Encounter  Procedures   Strep Gp B NAA+Rflx    Delon Prude, DO Attending Obstetrician & Gynecologist, Faculty Practice [facility] [employer], [facility]

## 2024-05-08 NOTE — Progress Notes (Signed)
 US  36+4 wks,frank breech,short thick placenta fundal gr 3,FHR 148 bpm,BPP 8/8,SUA,AFI 12 cm,RI .53,.57=24%,EFW 2433 g 10%

## 2024-05-12 ENCOUNTER — Ambulatory Visit: Payer: Self-pay | Admitting: Obstetrics & Gynecology

## 2024-05-12 LAB — STREP GP B SUSCEPTIBILITY

## 2024-05-12 LAB — STREP GP B NAA+RFLX: Strep Gp B NAA+Rflx: POSITIVE — AB

## 2024-05-15 ENCOUNTER — Encounter (HOSPITAL_COMMUNITY): Payer: Self-pay | Admitting: Family Medicine

## 2024-05-15 ENCOUNTER — Encounter (HOSPITAL_COMMUNITY): Payer: Self-pay | Admitting: Anesthesiology

## 2024-05-15 ENCOUNTER — Encounter: Admitting: Advanced Practice Midwife

## 2024-05-15 ENCOUNTER — Observation Stay (HOSPITAL_COMMUNITY)
Admission: AD | Admit: 2024-05-15 | Discharge: 2024-05-15 | Disposition: A | Attending: Family Medicine | Admitting: Family Medicine

## 2024-05-15 ENCOUNTER — Observation Stay (HOSPITAL_COMMUNITY)

## 2024-05-15 ENCOUNTER — Other Ambulatory Visit: Payer: Self-pay

## 2024-05-15 DIAGNOSIS — J45909 Unspecified asthma, uncomplicated: Secondary | ICD-10-CM | POA: Insufficient documentation

## 2024-05-15 DIAGNOSIS — Z91040 Latex allergy status: Secondary | ICD-10-CM | POA: Diagnosis not present

## 2024-05-15 DIAGNOSIS — O321XX0 Maternal care for breech presentation, not applicable or unspecified: Secondary | ICD-10-CM | POA: Diagnosis present

## 2024-05-15 DIAGNOSIS — Z3A37 37 weeks gestation of pregnancy: Secondary | ICD-10-CM | POA: Diagnosis not present

## 2024-05-15 DIAGNOSIS — Z87891 Personal history of nicotine dependence: Secondary | ICD-10-CM | POA: Insufficient documentation

## 2024-05-15 LAB — TYPE AND SCREEN
ABO/RH(D): A POS
Antibody Screen: NEGATIVE

## 2024-05-15 LAB — CBC
HCT: 40.3 % (ref 36.0–46.0)
Hemoglobin: 13.3 g/dL (ref 12.0–15.0)
MCH: 30.1 pg (ref 26.0–34.0)
MCHC: 33 g/dL (ref 30.0–36.0)
MCV: 91.2 fL (ref 80.0–100.0)
Platelets: 185 K/uL (ref 150–400)
RBC: 4.42 MIL/uL (ref 3.87–5.11)
RDW: 13.7 % (ref 11.5–15.5)
WBC: 9.7 K/uL (ref 4.0–10.5)
nRBC: 0 % (ref 0.0–0.2)

## 2024-05-15 LAB — RPR: RPR Ser Ql: NONREACTIVE

## 2024-05-15 LAB — GLUCOSE, CAPILLARY: Glucose-Capillary: 78 mg/dL (ref 70–99)

## 2024-05-15 MED ORDER — TERBUTALINE SULFATE 1 MG/ML IJ SOLN
INTRAMUSCULAR | Status: AC
  Filled 2024-05-15: qty 1

## 2024-05-15 MED ORDER — LACTATED RINGERS IV SOLN: INTRAVENOUS | Status: DC

## 2024-05-15 MED ORDER — POVIDONE-IODINE 10 % EX SWAB
2.0000 | Freq: Once | CUTANEOUS | Status: AC
  Administered 2024-05-15: 2 via TOPICAL

## 2024-05-15 MED ORDER — TERBUTALINE SULFATE 1 MG/ML IJ SOLN
0.2500 mg | Freq: Once | INTRAMUSCULAR | Status: AC
  Administered 2024-05-15: 0.25 mg via SUBCUTANEOUS

## 2024-05-15 NOTE — Discharge Summary (Signed)
 Postpartum Discharge Summary     Patient Name: Katherine Mora DOB: 1995/10/14 MRN: [id number]  Date of admission: 05/15/2024 Delivery date:This patient has no babies on file. Delivering provider: This patient has no babies on file. Date of discharge: 05/15/2024  Admitting diagnosis: Dempsey breech presentation [O32.1XX0] Intrauterine pregnancy: [redacted]w[redacted]d     Secondary diagnosis:  Principal Problem:   Dempsey breech presentation  Additional problems:     Discharge diagnosis: Vertex presentation of fetus, successful [facility]                                               Hospital course: Patient arrived for scheduled ECV. After verbal and written consent was obtained, she was given terbutaline and underwent a successful ECV with one attempt. She was monitored after the procedure and had a reactive NST. She was instructed to continue routine prenatal care.   Immunizations received: Immunization History  Administered Date(s) Administered   DTaP 05/28/1997, 09/01/1997, 09/29/1997, 12/29/1998, 02/19/2002   HIB, Unspecified 05/28/1997, 09/01/1997, 12/29/1998   HPV Quadrivalent 09/09/2008, 11/10/2008, 03/10/2009   Hepatitis A, Ped/Adol-2 Dose 02/19/2002, 08/06/2004   Hepatitis B, PED/ADOLESCENT 05/28/1997, 09/01/1997, 12/29/1998   IPV 12/29/1998, 02/19/2002   MMR 03/31/1998, 02/19/2002   Meningococcal Conjugate 11/10/2008, 11/07/2013   OPV 05/28/1997, 09/01/1997   PPD Test 08/08/2016   Pneumococcal Polysaccharide-23 08/02/2016   Tdap 09/09/2008, 07/16/2019   Varicella 03/31/1998, 09/29/2005    Physical exam  Vitals:   05/15/24 0828 05/15/24 0839 05/15/24 0945  BP:  119/79 119/65  Pulse:  90 92  Resp:  18   Temp:  98.4 F (36.9 C) 98.2 F (36.8 C)  TempSrc: Oral Oral Oral  SpO2:  96% 94%  Weight: 112.3 kg    Height: 5' 3 (1.6 m)      Physical Exam Vitals reviewed.  Constitutional:      General: She is not in acute distress.    Appearance: She is well-developed. She is not  diaphoretic.  Cardiovascular:     Rate and Rhythm: Normal rate and regular rhythm.     Heart sounds: Normal heart sounds. No murmur heard. Pulmonary:     Effort: Pulmonary effort is normal. No respiratory distress.     Breath sounds: Normal breath sounds. No wheezing or rales.  Abdominal:     General: Bowel sounds are normal. There is no distension.     Palpations: Abdomen is soft.     Tenderness: There is no abdominal tenderness. There is no guarding or rebound.  Skin:    General: Skin is warm and dry.  Neurological:     Mental Status: She is alert.     Coordination: Coordination normal.       Labs: Lab Results  Component Value Date   WBC 9.7 05/15/2024   HGB 13.3 05/15/2024   HCT 40.3 05/15/2024   MCV 91.2 05/15/2024   PLT 185 05/15/2024      Latest Ref Rng & Units 02/23/2024    4:22 AM  CMP  Glucose 70 - 99 mg/dL 96   BUN 6 - 20 mg/dL 8   Creatinine 9.55 - 8.99 mg/dL 9.45   Sodium 864 - 854 mmol/L 137   Potassium 3.5 - 5.1 mmol/L 4.1   Chloride 98 - 111 mmol/L 106   CO2 22 - 32 mmol/L 21   Calcium 8.9 - 10.3 mg/dL 9.2  Total Protein 6.5 - 8.1 g/dL 6.4   Total Bilirubin 0.0 - 1.2 mg/dL 0.4   Alkaline Phos 38 - 126 U/L 51   AST 15 - 41 U/L 20   ALT 0 - 44 U/L 20    Edinburgh Score:     No data to display         No data recorded  After visit meds:  Allergies as of 05/15/2024       Reactions   Fentanyl Shortness Of Breath, Rash   lethargic   Mometasone Furo-formoterol Fum Shortness Of Breath, Nausea And Vomiting   Asthma   Montelukast Hives, Shortness Of Breath   Penicillins Anaphylaxis   Family History    Pitocin [oxytocin] Hives   Family history, grandma anaplaxis, mom rash and hives   Tricyclic Antidepressants Other (See Comments)   Panic Attacks, Asthma Flare    Venlafaxine Hcl Other (See Comments)   Blurred vision, feeling faint   Clindamycin Rash   Latex Rash   Prednisone Anxiety, Rash        Medication List     STOP taking  these medications    nitrofurantoin (macrocrystal-monohydrate) 100 MG capsule Commonly known as: MACROBID   progesterone  200 MG capsule Commonly known as: PROMETRIUM        TAKE these medications    cetirizine  10 MG tablet Commonly known as: ZYRTEC  TAKE 1 TO 2 TABLETS BY MOUTH DAILY   EPINEPHrine  0.3 mg/0.3 mL Soaj injection Commonly known as: EpiPen  2-Pak Inject 0.3 mg into the muscle as needed for anaphylaxis.   MAGNESIUM PO Take 483 mg by mouth daily.   pantoprazole  20 MG tablet Commonly known as: Protonix  Take 1 tablet (20 mg total) by mouth daily.   PRENATAL VITAMIN PO Take 1 tablet by mouth daily.   Ventolin  HFA 108 (90 Base) MCG/ACT inhaler Generic drug: albuterol  Inhale 2 puffs into the lungs every 4 (four) hours as needed for wheezing or shortness of breath.   zafirlukast  10 MG tablet Commonly known as: ACCOLATE  TAKE 1 TABLET BY MOUTH TWICE A DAY.         Discharge home in stable condition   Future Appointments: Future Appointments  Date Time Provider Department Center  05/16/2024  1:50 PM Kizzie Suzen SAUNDERS, CNM [facility]  05/22/2024 11:10 AM Loreli Suzen BIRCH, CNM [facility]  05/27/2024  2:10 PM Jayne Vonn DEL, MD [facility]  07/08/2024 12:45 PM Whitfield Raisin, NP [facility] None  07/17/2024  1:00 PM Bevely Doffing, FNP [facility] [phone]     05/15/2024 Donnice CHRISTELLA Carolus, MD

## 2024-05-15 NOTE — Procedures (Signed)
 ECV Procedure Note Patient was not given neuraxial anesthesia prior to attempting the procedure. NST was reactive prior to the procedure.  Patient was given 0.25 mg of SQ terbutaline prior to initiating the procedure. ECV was attempted 1 time. Procedure was successful. Patient will be monitored post procedure until we have at least 20 minutes of tracing and a reactive NST.

## 2024-05-15 NOTE — H&P (Signed)
 LABOR AND DELIVERY ADMISSION HISTORY AND PHYSICAL NOTE  Katherine Mora is a 28 y.o. female G10P0090 with IUP at [redacted]w[redacted]d presenting for scheduled ECV.   Patient reports the fetal movement as active. Patient reports uterine contraction  activity as none. Patient reports  vaginal bleeding as none. Patient describes fluid per vagina as None.   She plans on breast feeding feeding. Her contraception plan is: condoms.  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   Hyperpigmented skin lesion 11/11/2022   Malabsorption 11/11/2022   Family history of sarcoidosis 11/11/2022   Fibromyalgia 02/15/2021   Food intolerance 09/09/2020   Seasonal allergic rhinitis due to pollen 09/09/2020   Mild intermittent asthma, uncomplicated 09/09/2020    Past Medical History: Past Medical History:  Diagnosis Date   Anxiety    Asthma    bipolar 1    Bipolar   Eczema    Fibromyalgia 2018   Heart palpitations    History of kidney stones    Hypoglycemia    Migraines    PONV (postoperative nausea and vomiting)     Past Surgical History: Past Surgical History:  Procedure Laterality Date   d and c N/A 2017   14 week size   DILATION AND EVACUATION N/A 08/01/2023   Procedure: DILATATION AND EVACUATION;  Surgeon: Jayne Vonn DEL, MD;  Location: [facility];  Service: Gynecology;  Laterality: N/A;   NASAL SEPTUM SURGERY Bilateral 2015   SINOSCOPY      Obstetrical History: OB History      Gravida  10   Para      Term      Preterm      AB  9   Living         SAB  9   IAB      Ectopic      Multiple      Live Births              Social History: Social History   Socioeconomic History   Marital status: Single    Spouse name: Not on file   Number of children: 0   Years of education: Not on file   Highest education level: Some college, no degree  Occupational History   Not on file  Tobacco Use   Smoking status: Former    Types: Cigarettes    Passive exposure: Current   Smokeless tobacco: Never  Vaping Use   Vaping status: Former  Substance and Sexual Activity   Alcohol use: Not Currently   Drug use: Not Currently   Sexual activity: Yes    Birth control/protection: None  Other Topics Concern   Not on file  Social History Narrative   Right handed   Drinks soda (rare)   Waiting to get glasses    Social Drivers of Health   Financial Resource Strain: Medium Risk (07/24/2023)   Overall Financial Resource Strain (CARDIA)    Difficulty of Paying Living Expenses: Somewhat hard  Food Insecurity: No Food Insecurity (05/15/2024)  Hunger Vital Sign    Worried About Running Out of Food in the Last Year: Never true    Ran Out of Food in the Last Year: Never true  Transportation Needs: No Transportation Needs (05/15/2024)   PRAPARE - Administrator, Civil Service (Medical): No    Lack of Transportation (Non-Medical): No  Physical Activity: Insufficiently Active (07/24/2023)   Exercise Vital Sign    Days of Exercise per Week: 3 days    Minutes of Exercise per Session: 30 min  Stress: No Stress Concern Present (07/24/2023)   [employer] of Occupational Health - Occupational Stress Questionnaire    Feeling of Stress : Only a little  Social Connections: Moderately Isolated (07/24/2023)   Social Connection and Isolation Panel    Frequency of Communication with Friends and Family: Twice a week    Frequency of Social Gatherings with  Friends and Family: Once a week    Attends Religious Services: 1 to 4 times per year    Active Member of [employer] or Organizations: No    Attends Engineer, Structural: Never    Marital Status: Never married    Family History: Family History  Problem Relation Age of Onset   Heart attack Father    Diabetes Father    Eczema Brother    Allergic rhinitis Brother    Urticaria Brother    Cancer Paternal Aunt        brain   Sarcoidosis Maternal Grandmother    Kidney failure Maternal Grandmother    Cancer Maternal Grandfather        skin   Colon cancer Neg Hx    Colon polyps Neg Hx     Allergies: Allergies  Allergen Reactions   Fentanyl Shortness Of Breath and Rash    lethargic   Mometasone Furo-Formoterol Fum Shortness Of Breath and Nausea And Vomiting    Asthma   Montelukast Hives and Shortness Of Breath   Penicillins Anaphylaxis    Family History    Pitocin [Oxytocin] Hives    Family history, grandma anaplaxis, mom rash and hives   Tricyclic Antidepressants Other (See Comments)    Panic Attacks, Asthma Flare    Venlafaxine Hcl Other (See Comments)    Blurred vision, feeling faint   Clindamycin Rash   Latex Rash   Prednisone Anxiety and Rash    Medications Prior to Admission  Medication Sig Dispense Refill Last Dose/Taking   cetirizine  (ZYRTEC ) 10 MG tablet TAKE 1 TO 2 TABLETS BY MOUTH DAILY 60 tablet 0 05/14/2024   EPINEPHrine  (EPIPEN  2-PAK) 0.3 mg/0.3 mL IJ SOAJ injection Inject 0.3 mg into the muscle as needed for anaphylaxis. 2 each 2 05/14/2024   MAGNESIUM PO Take 483 mg by mouth daily.   05/14/2024   pantoprazole  (PROTONIX ) 20 MG tablet Take 1 tablet (20 mg total) by mouth daily. 30 tablet 6 05/14/2024   Prenatal Vit-Fe Fumarate-FA (PRENATAL VITAMIN PO) Take 1 tablet by mouth daily.   05/14/2024   zafirlukast  (ACCOLATE ) 10 MG tablet TAKE 1 TABLET BY MOUTH TWICE A DAY. 60 tablet 0 05/14/2024   nitrofurantoin, macrocrystal-monohydrate, (MACROBID) 100 MG  capsule Take 1 capsule (100 mg total) by mouth 2 (two) times daily. (Patient not taking: Reported on 05/08/2024) 14 capsule 1    progesterone  (PROMETRIUM ) 200 MG capsule PLACE 1 CAPSULE IN VAGINA AT BEDTIME NIGHTLY (Patient not taking: Reported on 05/08/2024) 90 capsule 1    VENTOLIN  HFA 108 (90 Base) MCG/ACT inhaler Inhale 2 puffs into the  lungs every 4 (four) hours as needed for wheezing or shortness of breath.        Review of Systems  All systems reviewed and negative except as stated in HPI  Physical Exam BP 119/79 (BP Location: Left Arm)   Pulse 90   Temp 98.4 F (36.9 C) (Oral)   Resp 18   Ht 5' 3 (1.6 m)   Wt 112.3 kg   LMP  (LMP Unknown)   SpO2 96%   BMI 43.84 kg/m   Physical Exam Vitals reviewed.  Constitutional:      General: She is not in acute distress.    Appearance: She is well-developed. She is not diaphoretic.  Cardiovascular:     Rate and Rhythm: Normal rate and regular rhythm.     Heart sounds: Normal heart sounds. No murmur heard. Pulmonary:     Effort: Pulmonary effort is normal. No respiratory distress.     Breath sounds: Normal breath sounds. No wheezing or rales.  Abdominal:     General: Bowel sounds are normal. There is no distension.     Palpations: Abdomen is soft.     Tenderness: There is no abdominal tenderness. There is no guarding or rebound.  Skin:    General: Skin is warm and dry.  Neurological:     Mental Status: She is alert.     Coordination: Coordination normal.     Presentation: frank breech by bedside US   Fetal monitoring: Baseline: 145 bpm, Variability: Good {> 6 bpm), Accelerations: Reactive, and Decelerations: Absent Uterine activity: None     Prenatal labs: ABO, Rh: --/--/PENDING (11/12 0845) Antibody: PENDING (11/12 0845) Rubella: 1.14 (05/21 1346) RPR: Non Reactive (09/03 0837)  HBsAg: Negative (05/21 1346)  HIV: Non Reactive (09/03 0837)  GC/Chlamydia:  Neisseria Gonorrhea  Date Value Ref Range Status   05/03/2024 Negative  Final   Chlamydia  Date Value Ref Range Status  05/03/2024 Negative  Final   GBS: --Katherine Mora (11/05 1542)    Prenatal Transfer Tool  Maternal Diabetes: No Genetic Screening: Normal Maternal Ultrasounds/Referrals: Normal but borderline for IUGR Fetal Ultrasounds or other Referrals:  None Maternal Substance Abuse:  No Significant Maternal Medications:  Meds include: Progesterone  Protonix  Other: allergy  meds Significant Maternal Lab Results: Group B Strep positive  Results for orders placed or performed during the hospital encounter of 05/15/24 (from the past 24 hours)  Type and screen   Collection Time: 05/15/24  8:45 AM  Result Value Ref Range   ABO/RH(D) PENDING    Antibody Screen PENDING    Sample Expiration      05/18/2024,2359 Performed at [facility], [address], [place], [place] [address]   Glucose, capillary   Collection Time: 05/15/24  8:53 AM  Result Value Ref Range   Glucose-Capillary 78 70 - 99 mg/dL    Assessment: Katherine Mora is a 28 y.o. G10P0090 at [redacted]w[redacted]d here for scheduled ECV  #Breech presentation:  Discussed risks and benefits of procedure. Discussed intent of procedure is to turn baby to vertex to avoid cesarean, but if unsuccessful will need primary cesarean around 39 weeks. Discussed success rate is generally around 50%. Reviewed approximately 1% risk of fetal intolerance requiring emergency cesarean section, due to placental abruption, cord accident, rupture of membranes +/- cord prolapse, or other events leading to fetal distress.   The risks of cesarean section were also discussed with the patient and included but were not limited to: bleeding which may require transfusion or reoperation; infection which  may require antibiotics; injury to bowel, bladder, ureters or other surrounding organs; injury to the fetus; need for additional procedures including hysterectomy in the event of a life-threatening hemorrhage;  placental abnormalities with subsequent pregnancies, incisional problems, thromboembolic phenomenon and other postoperative/anesthesia complications.   Verbal and written consent was obtained.  Blood type is Rh positive, rhogam was not indicated. Patient has been NPO since last night she will remain NPO for procedure. Anesthesia and OR aware.   Will give terbutaline 0.25 mg SQ and wait until heart rate is >100 prior to starting procedure.     #Pain: No neuraxial anesthesia #FHT: Category I #GBS/ID: Positive #MOF: breast feeding #MOC: condoms #Circ: outpatient  Katherine CHRISTELLA Carolus, MD/MPH Attending Family Medicine Physician, [facility] for [facility], [facility]  05/15/2024, 9:11 AM

## 2024-05-15 NOTE — Discharge Instructions (Signed)
 You came to the hospital for an external cephalic version (ECV). This was successful, and your baby is now head down. Make sure to wear your binder as much as possible prevent baby from returning to a breech position. Go to your regularly scheduled prenatal appointments.

## 2024-05-16 ENCOUNTER — Encounter: Payer: Self-pay | Admitting: Women's Health

## 2024-05-16 ENCOUNTER — Ambulatory Visit: Admitting: Women's Health

## 2024-05-16 ENCOUNTER — Ambulatory Visit: Payer: Self-pay | Admitting: Obstetrics & Gynecology

## 2024-05-16 VITALS — BP 123/85 | HR 102 | Wt 248.4 lb

## 2024-05-16 DIAGNOSIS — Z3483 Encounter for supervision of other normal pregnancy, third trimester: Secondary | ICD-10-CM | POA: Diagnosis not present

## 2024-05-16 DIAGNOSIS — Z3A37 37 weeks gestation of pregnancy: Secondary | ICD-10-CM

## 2024-05-16 DIAGNOSIS — Q270 Congenital absence and hypoplasia of umbilical artery: Secondary | ICD-10-CM | POA: Diagnosis not present

## 2024-05-16 NOTE — Patient Instructions (Signed)
 Katherine Mora, thank you for choosing our office today! We appreciate the opportunity to meet your healthcare needs. You may receive a short survey by mail, e-mail, or through [employer]. If you are happy with your care we would appreciate if you could take just a few minutes to complete the survey questions. We read all of your comments and take your feedback very seriously. Thank you again for choosing our office.  Center for [employer] Team at [facility]  [facility] ([address] [place], [place] [address]) Entrance C, located off of [address] Free 24/7 valet parking   CLASSES: Go to [email or web address] to register for classes (childbirth, breastfeeding, waterbirth, infant CPR, daddy bootcamp, etc.)  Call the office [phone]) or go to [facility] if: You begin to have strong, frequent contractions Your water breaks.  Sometimes it is a big gush of fluid, sometimes it is just a trickle that keeps getting your panties wet or running down your legs You have vaginal bleeding.  It is normal to have a small amount of spotting if your cervix was checked.  You don't feel your baby moving like normal.  If you don't, get you something to eat and drink and lay down and focus on feeling your baby move.   If your baby is still not moving like normal, you should call the office or go to [facility].  Call the office [phone]) or go to [facility] hospital for these signs of pre-eclampsia: Severe headache that does not go away with Tylenol  Visual changes- seeing spots, double, blurred vision Pain under your right breast or upper abdomen that does not go away with Tums or heartburn medicine Nausea and/or vomiting Severe swelling in your hands, feet, and face   [place] Pediatricians/Family Doctors [facility]): [address] Luba BROCKS, [phone]           [facility]: [address], [phone]                 [facility]): [address], [phone] (call to ask if accepting patients) [facility] Department: [address] [place], [phone]    [facility] Pediatricians/Family Doctors [facility]): [address], [phone] [facility]: [address] [place], [phone] [facility]: [address], [phone]  [facility] Doctors  [facility]): [phone] [facility]: [address] [phone]   [facility] Doctors [facility]: [address] [phone]  [facility] Doctors  [facility]: [phone], [phone]  Home Blood Pressure Monitoring for Patients   Your provider has recommended that you check your blood pressure (BP) at least once a week at home. If you do not have a blood pressure cuff at home, one will be provided for you. Contact your provider if you have not received your monitor within 1 week.   Helpful Tips for Accurate Home Blood Pressure Checks  Don't smoke, exercise, or drink caffeine 30 minutes before checking your BP Use the restroom before checking your BP (a full bladder can raise your pressure) Relax in a comfortable upright chair Feet on the ground Left arm resting comfortably on a flat surface at the level of your heart Legs uncrossed Back supported Sit quietly and don't talk Place the cuff on your bare arm Adjust snuggly, so that only two fingertips  can fit between your skin and the top of the cuff Check 2 readings separated by at least one minute Keep a log of your BP readings For a visual, please reference this diagram: http://ccnc.care/bpdiagram  Provider Name: [facility]     Phone: [phone]  Zone 1: ALL CLEAR  Continue to monitor your symptoms:  BP reading is less than 140 (top number) or less than 90 (bottom number)  No right  upper stomach pain No headaches or seeing spots No feeling nauseated or throwing up No swelling in face and hands  Zone 2: CAUTION Call your doctor's office for any of the following:  BP reading is greater than 140 (top number) or greater than 90 (bottom number)  Stomach pain under your ribs in the middle or right side Headaches or seeing spots Feeling nauseated or throwing up Swelling in face and hands  Zone 3: EMERGENCY  Seek immediate medical care if you have any of the following:  BP reading is greater than160 (top number) or greater than 110 (bottom number) Severe headaches not improving with Tylenol  Serious difficulty catching your breath Any worsening symptoms from Zone 2   Braxton Hicks Contractions Contractions of the uterus can occur throughout pregnancy, but they are not always a sign that you are in labor. You may have practice contractions called Braxton Hicks contractions. These false labor contractions are sometimes confused with true labor. What are Darol Irving contractions? Braxton Hicks contractions are tightening movements that occur in the muscles of the uterus before labor. Unlike true labor contractions, these contractions do not result in opening (dilation) and thinning of the cervix. Toward the end of pregnancy (32-34 weeks), Braxton Hicks contractions can happen more often and may become stronger. These contractions are sometimes difficult to tell apart from true labor because they can be very uncomfortable. You should not feel embarrassed if you go to the hospital with false labor. Sometimes, the only way to tell if you are in true labor is for your health care provider to look for changes in the cervix. The health care provider will do a physical exam and may monitor your contractions. If you are not in true labor, the exam should show that your cervix is not dilating and your water has not broken. If there are no other health problems associated with your  pregnancy, it is completely safe for you to be sent home with false labor. You may continue to have Braxton Hicks contractions until you go into true labor. How to tell the difference between true labor and false labor True labor Contractions last 30-70 seconds. Contractions become very regular. Discomfort is usually felt in the top of the uterus, and it spreads to the lower abdomen and low back. Contractions do not go away with walking. Contractions usually become more intense and increase in frequency. The cervix dilates and gets thinner. False labor Contractions are usually shorter and not as strong as true labor contractions. Contractions are usually irregular. Contractions are often felt in the front of the lower abdomen and in the groin. Contractions may go away when you walk around or change positions while lying down. Contractions get weaker and are shorter-lasting as time goes on. The cervix usually does not dilate or become thin. Follow these instructions at home:  Take over-the-counter and prescription medicines only as told by your health care provider. Keep up with your usual exercises and follow other instructions from your health care provider. Eat and drink lightly if you think  you are going into labor. If Braxton Hicks contractions are making you uncomfortable: Change your position from lying down or resting to walking, or change from walking to resting. Sit and rest in a tub of warm water. Drink enough fluid to keep your urine pale yellow. Dehydration may cause these contractions. Do slow and deep breathing several times an hour. Keep all follow-up prenatal visits as told by your health care provider. This is important. Contact a health care provider if: You have a fever. You have continuous pain in your abdomen. Get help right away if: Your contractions become stronger, more regular, and closer together. You have fluid leaking or gushing from your vagina. You pass  blood-tinged mucus (bloody show). You have bleeding from your vagina. You have low back pain that you never had before. You feel your baby's head pushing down and causing pelvic pressure. Your baby is not moving inside you as much as it used to. Summary Contractions that occur before labor are called Braxton Hicks contractions, false labor, or practice contractions. Braxton Hicks contractions are usually shorter, weaker, farther apart, and less regular than true labor contractions. True labor contractions usually become progressively stronger and regular, and they become more frequent. Manage discomfort from [facility] contractions by changing position, resting in a warm bath, drinking plenty of water, or practicing deep breathing. This information is not intended to replace advice given to you by your health care provider. Make sure you discuss any questions you have with your health care provider. Document Revised: 06/02/2017 Document Reviewed: 11/03/2016 Elsevier Patient Education  2020 [employer].

## 2024-05-16 NOTE — Progress Notes (Signed)
 LOW-RISK PREGNANCY VISIT Patient name: Katherine Mora MRN [id number]  Date of birth: 04-13-1996 Chief Complaint:   Routine Prenatal Visit (Cervical check)  History of Present Illness:   Katherine Mora is a 28 y.o. G10P0090 female at [redacted]w[redacted]d with an Estimated Date of Delivery: 06/01/24 being seen today for ongoing management of a low-risk pregnancy.   Today she reports belly sore from ECV yesterday.  Contractions: Irritability.  .  Movement: Present. denies leaking of fluid.     03/06/2024   10:03 AM 11/22/2023   10:40 AM 07/24/2023   11:45 AM 06/29/2023    1:35 PM 01/13/2023   10:36 AM  Depression screen PHQ 2/9  Decreased Interest 0 0 0 0 0  Down, Depressed, Hopeless 0 0 0 0 0  PHQ - 2 Score 0 0 0 0 0  Altered sleeping 1 2 2  0 1  Tired, decreased energy 1 1 2  0 1  Change in appetite 0 0 0 0 1  Feeling bad or failure about yourself  0 0 0 0 1  Trouble concentrating 1 2 0 0 0  Moving slowly or fidgety/restless 0 0 0 0 0  Suicidal thoughts 0 0 0 0 0  PHQ-9 Score 3  5  4   0  4   Difficult doing work/chores    Not difficult at all      Data saved with a previous flowsheet row definition        11/22/2023   10:40 AM 07/24/2023   11:45 AM 06/29/2023    1:36 PM 01/13/2023   10:38 AM  GAD 7 : Generalized Anxiety Score  Nervous, Anxious, on Edge 2 0 0 1  Control/stop worrying 2 0 0 0  Worry too much - different things 1 1 0 0  Trouble relaxing 0 0 0 1  Restless 0 0 0 0  Easily annoyed or irritable 2 0 0 2  Afraid - awful might happen 0 0 0 0  Total GAD 7 Score 7 1 0 4  Anxiety Difficulty    Somewhat difficult      Review of Systems:   Pertinent items are noted in HPI Denies abnormal vaginal discharge w/ itching/odor/irritation, headaches, visual changes, shortness of breath, chest pain, abdominal pain, severe nausea/vomiting, or problems with urination or bowel movements unless otherwise stated above. Pertinent History Reviewed:  Reviewed past medical,surgical,  social, obstetrical and family history.  Reviewed problem list, medications and allergies. Physical Assessment:   Vitals:   05/16/24 1346  BP: 123/85  Pulse: (!) 102  Weight: 248 lb 6.4 oz (112.7 kg)  Body mass index is 44 kg/m.        Physical Examination:   General appearance: Well appearing, and in no distress  Mental status: Alert, oriented to person, place, and time  Skin: Warm & dry  Cardiovascular: Normal heart rate noted  Respiratory: Normal respiratory effort, no distress  Abdomen: Soft, gravid, nontender  Pelvic: Cervical exam performed  Dilation: 1 Effacement (%): Thick Station: Ballotable  Extremities: Edema: Trace  Fetal Status: Fetal Heart Rate (bpm): 144 Fundal Height: 36 cm Movement: Present Presentation: Vertex  Chaperone: Peggy Dones No results found for this or any previous visit (from the past 24 hours).  Assessment & Plan:  1) Low-risk pregnancy G10P0090 at [redacted]w[redacted]d with an Estimated Date of Delivery: 06/01/24   2) SUA> EFW 10% (AC 16%)-short FL, w/ normal UAD at 36.4  3) S/P successful ECV yesterday> wearing abd binder  4)  Plans waterbirth> went to class and sent certificate, understands providers at hospital during admission will determine eligibility    Meds: No orders of the defined types were placed in this encounter.  Labs/procedures today: SVE  Plan:  Continue routine obstetrical care Next visit: w/ Dr. Jayne tomorrow to discuss IOL/timing, states she was told 39w  Reviewed: Term labor symptoms and general obstetric precautions including but not limited to vaginal bleeding, contractions, leaking of fluid and fetal movement were reviewed in detail with the patient.  All questions were answered. Does have home bp cuff. Office bp cuff given: not applicable. Check bp weekly, let us  know if consistently >140 and/or >90.  Follow-up: Return for tomorrow w/ Dr. Jayne only.  Future Appointments  Date Time Provider Department Center  05/17/2024 10:50 AM  Jayne Vonn DEL, MD [facility]  05/22/2024 11:10 AM Loreli Suzen BIR[facility], CNM [facility]  05/27/2024  2:10 PM Jayne Vonn DEL, MD [facility]  07/08/2024 12:45 PM Whitfield Raisin, NP [facility] None  07/17/2024  1:00 PM Bevely Doffing, FNP [facility] [address]    No orders of the defined types were placed in this encounter.  Suzen JONELLE Fetters CNM, [facility] 05/16/2024 2:58 PM

## 2024-05-17 ENCOUNTER — Encounter: Payer: Self-pay | Admitting: Obstetrics & Gynecology

## 2024-05-17 ENCOUNTER — Ambulatory Visit: Admitting: Obstetrics & Gynecology

## 2024-05-17 VITALS — BP 123/81 | HR 102 | Wt 248.4 lb

## 2024-05-17 DIAGNOSIS — O365990 Maternal care for other known or suspected poor fetal growth, unspecified trimester, not applicable or unspecified: Secondary | ICD-10-CM

## 2024-05-17 DIAGNOSIS — Z3A37 37 weeks gestation of pregnancy: Secondary | ICD-10-CM

## 2024-05-17 DIAGNOSIS — Q270 Congenital absence and hypoplasia of umbilical artery: Secondary | ICD-10-CM | POA: Diagnosis not present

## 2024-05-17 DIAGNOSIS — O0993 Supervision of high risk pregnancy, unspecified, third trimester: Secondary | ICD-10-CM

## 2024-05-17 DIAGNOSIS — O365930 Maternal care for other known or suspected poor fetal growth, third trimester, not applicable or unspecified: Secondary | ICD-10-CM

## 2024-05-17 DIAGNOSIS — O0990 Supervision of high risk pregnancy, unspecified, unspecified trimester: Secondary | ICD-10-CM

## 2024-05-17 NOTE — Progress Notes (Signed)
 HIGH-RISK PREGNANCY VISIT Patient name: Katherine Mora MRN [id number]  Date of birth: 1996/02/08 Chief Complaint:   Routine Prenatal Visit (Has concern about pregnancy. Want lab work)  History of Present Illness:   Katherine Mora is a 28 y.o. [phone] female at [redacted]w[redacted]d with an Estimated Date of Delivery: 06/01/24 being seen today for ongoing management of a high-risk pregnancy complicated by     ICD-10-CM   1. Supervision of high risk pregnancy, antepartum  O09.90     2. [redacted] weeks gestation of pregnancy  Z3A.37     3. Single umbilical artery  Q27.0     4. Fetal growth restriction antepartum: 10% + SUA  O36.5990      .    Today she reports no complaints. Contractions: Irritability.  .  Movement: Present. denies leaking of fluid.      03/06/2024   10:03 AM 11/22/2023   10:40 AM 07/24/2023   11:45 AM 06/29/2023    1:35 PM 01/13/2023   10:36 AM  Depression screen PHQ 2/9  Decreased Interest 0 0 0 0 0  Down, Depressed, Hopeless 0 0 0 0 0  PHQ - 2 Score 0 0 0 0 0  Altered sleeping 1 2 2  0 1  Tired, decreased energy 1 1 2  0 1  Change in appetite 0 0 0 0 1  Feeling bad or failure about yourself  0 0 0 0 1  Trouble concentrating 1 2 0 0 0  Moving slowly or fidgety/restless 0 0 0 0 0  Suicidal thoughts 0 0 0 0 0  PHQ-9 Score 3  5  4   0  4   Difficult doing work/chores    Not difficult at all      Data saved with a previous flowsheet row definition        11/22/2023   10:40 AM 07/24/2023   11:45 AM 06/29/2023    1:36 PM 01/13/2023   10:38 AM  GAD 7 : Generalized Anxiety Score  Nervous, Anxious, on Edge 2 0 0 1  Control/stop worrying 2 0 0 0  Worry too much - different things 1 1 0 0  Trouble relaxing 0 0 0 1  Restless 0 0 0 0  Easily annoyed or irritable 2 0 0 2  Afraid - awful might happen 0 0 0 0  Total GAD 7 Score 7 1 0 4  Anxiety Difficulty    Somewhat difficult     Review of Systems:   Pertinent items are noted in HPI Denies abnormal vaginal discharge  w/ itching/odor/irritation, headaches, visual changes, shortness of breath, chest pain, abdominal pain, severe nausea/vomiting, or problems with urination or bowel movements unless otherwise stated above. Pertinent History Reviewed:  Reviewed past medical,surgical, social, obstetrical and family history.  Reviewed problem list, medications and allergies. Physical Assessment:   Vitals:   05/17/24 1113  BP: 123/81  Pulse: (!) 102  Weight: 248 lb 6.4 oz (112.7 kg)  Body mass index is 44 kg/m.           Physical Examination:   General appearance: alert, well appearing, and in no distress  Mental status: alert, oriented to person, place, and time  Skin: warm & dry   Extremities: Edema: Trace    Cardiovascular: normal heart rate noted  Respiratory: normal respiratory effort, no distress  Abdomen: gravid, soft, non-tender  Pelvic: Cervical exam deferred         Fetal Status:     Movement: Present  Fetal Surveillance Testing today: na   Chaperone: N/A    No results found for this or any previous visit (from the past 24 hours).  Assessment & Plan:  High-risk pregnancy: G10P0090 at [redacted]w[redacted]d with an Estimated Date of Delivery: 06/01/24      ICD-10-CM   1. Supervision of high risk pregnancy, antepartum  O09.90     2. [redacted] weeks gestation of pregnancy  Z3A.37     3. Single umbilical artery  Q27.0     4. Fetal growth restriction antepartum: 10% + SUA  O36.5990          Meds: No orders of the defined types were placed in this encounter.   Orders: No orders of the defined types were placed in this encounter.    Labs/procedures today: none  Treatment Plan:  IOL 39w scheduled    Follow-up: No follow-ups on file.   Future Appointments  Date Time Provider Department Center  05/22/2024 11:10 AM Loreli Suzen BIR[facility], CNM [facility]  05/26/2024 12:00 AM [facility] SCHED ROOM [facility] None  07/08/2024 12:45 PM Whitfield Raisin, NP [facility] None  07/17/2024  1:00 PM Bevely Doffing, FNP  [facility] [address]    No orders of the defined types were placed in this encounter.  Vonn VEAR Inch  Attending Physician for the [facility] 05/17/2024 12:06 PM

## 2024-05-17 NOTE — Addendum Note (Signed)
 Addended by: JAYNE VONN DEL on: 05/17/2024 12:48 PM   Modules accepted: Orders

## 2024-05-17 NOTE — Progress Notes (Signed)
   Induction Assessment Scheduling Form: Fax to [facility]:  [phone]  JERUSHA REISING                                                                                   DOB:  10-14-1995                                                            MRN:  [id number]                                                                     Phone #:       [phone]                      Provider:  [facility]  GP:  [phone]                                                            Estimated Date of Delivery: 06/01/24  Dating Criteria: early scan    Medical Indications for induction:  FGR (10%) + SUA Admission Date/Time:  05/25/24 @ 2345 Gestational age on admission:  107w1d   Filed Weights   05/17/24 1113  Weight: 248 lb 6.4 oz (112.7 kg)   HIV:  Non Reactive (09/03 9162) GBS: --Marzette (11/05 1542)  Cx exam pending   Method of induction(proposed):  choice   Scheduling Provider Signature:  Vonn VEAR Inch, MD                                            Today's Date:  05/17/2024

## 2024-05-20 ENCOUNTER — Encounter (HOSPITAL_COMMUNITY): Payer: Self-pay | Admitting: *Deleted

## 2024-05-20 ENCOUNTER — Telehealth (HOSPITAL_COMMUNITY): Payer: Self-pay | Admitting: *Deleted

## 2024-05-20 NOTE — Telephone Encounter (Signed)
 Preadmission screen

## 2024-05-22 ENCOUNTER — Ambulatory Visit (INDEPENDENT_AMBULATORY_CARE_PROVIDER_SITE_OTHER): Admitting: Advanced Practice Midwife

## 2024-05-22 ENCOUNTER — Encounter: Payer: Self-pay | Admitting: Advanced Practice Midwife

## 2024-05-22 VITALS — BP 119/79 | HR 103 | Wt 251.0 lb

## 2024-05-22 DIAGNOSIS — O0990 Supervision of high risk pregnancy, unspecified, unspecified trimester: Secondary | ICD-10-CM

## 2024-05-22 DIAGNOSIS — O0993 Supervision of high risk pregnancy, unspecified, third trimester: Secondary | ICD-10-CM

## 2024-05-22 DIAGNOSIS — Z1389 Encounter for screening for other disorder: Secondary | ICD-10-CM | POA: Diagnosis not present

## 2024-05-22 DIAGNOSIS — Z331 Pregnant state, incidental: Secondary | ICD-10-CM

## 2024-05-22 DIAGNOSIS — Z3A38 38 weeks gestation of pregnancy: Secondary | ICD-10-CM | POA: Diagnosis not present

## 2024-05-22 LAB — POCT URINALYSIS DIPSTICK OB
Blood, UA: NEGATIVE
Glucose, UA: NEGATIVE
Ketones, UA: NEGATIVE
Leukocytes, UA: NEGATIVE
Nitrite, UA: NEGATIVE
POC,PROTEIN,UA: NEGATIVE

## 2024-05-22 NOTE — Progress Notes (Signed)
 HIGH-RISK PREGNANCY VISIT Patient name: Katherine Mora MRN [id number]  Date of birth: [birth year] Chief Complaint:   Routine Prenatal Visit (Cervical check)  History of Present Illness:   Katherine Mora is a 28 y.o. G10P0090 female at [redacted]w[redacted]d with an Estimated Date of Delivery: 06/01/24 being seen today for ongoing management of a high-risk pregnancy complicated by single umbilical artery.    Today she reports no complaints. Contractions: Irregular.  .  Movement: Present. denies leaking of fluid.      03/06/2024   10:03 AM 11/22/2023   10:40 AM 07/24/2023   11:45 AM 06/29/2023    1:35 PM 01/13/2023   10:36 AM  Depression screen PHQ 2/9  Decreased Interest 0 0 0 0 0  Down, Depressed, Hopeless 0 0 0 0 0  PHQ - 2 Score 0 0 0 0 0  Altered sleeping 1 2 2  0 1  Tired, decreased energy 1 1 2  0 1  Change in appetite 0 0 0 0 1  Feeling bad or failure about yourself  0 0 0 0 1  Trouble concentrating 1 2 0 0 0  Moving slowly or fidgety/restless 0 0 0 0 0  Suicidal thoughts 0 0 0 0 0  PHQ-9 Score 3  5  4   0  4   Difficult doing work/chores    Not difficult at all      Data saved with a previous flowsheet row definition        11/22/2023   10:40 AM 07/24/2023   11:45 AM 06/29/2023    1:36 PM 01/13/2023   10:38 AM  GAD 7 : Generalized Anxiety Score  Nervous, Anxious, on Edge 2 0 0 1  Control/stop worrying 2 0 0 0  Worry too much - different things 1 1 0 0  Trouble relaxing 0 0 0 1  Restless 0 0 0 0  Easily annoyed or irritable 2 0 0 2  Afraid - awful might happen 0 0 0 0  Total GAD 7 Score 7 1 0 4  Anxiety Difficulty    Somewhat difficult     Review of Systems:   Pertinent items are noted in HPI Denies abnormal vaginal discharge w/ itching/odor/irritation, headaches, visual changes, shortness of breath, chest pain, abdominal pain, severe nausea/vomiting, or problems with urination or bowel movements unless otherwise stated above. Pertinent History Reviewed:  Reviewed past  medical,surgical, social, obstetrical and family history.  Reviewed problem list, medications and allergies. Physical Assessment:   Vitals:   05/22/24 1124  BP: 119/79  Pulse: (!) 103  Weight: 251 lb (113.9 kg)  Body mass index is 44.46 kg/m.           Physical Examination:   General appearance: alert, well appearing, and in no distress  Mental status: alert, oriented to person, place, and time  Skin: warm & dry   Extremities: Edema: Trace    Cardiovascular: normal heart rate noted  Respiratory: normal respiratory effort, no distress  Abdomen: gravid, soft, non-tender  Pelvic: Cervical exam performed  Dilation: 1.5 Effacement (%): Thick Station: -3  Fetal Status: Fetal Heart Rate (bpm): 147   Movement: Present Presentation: Vertex  Fetal Surveillance Testing today: doppler    Results for orders placed or performed in visit on 05/22/24 (from the past 24 hours)  POC Urinalysis Dipstick OB   Collection Time: 05/22/24 11:39 AM  Result Value Ref Range   Color, UA     Clarity, UA     Glucose, UA  Negative Negative   Bilirubin, UA     Ketones, UA negative    Spec Grav, UA     Blood, UA negative    pH, UA     POC,PROTEIN,UA Negative Negative, Trace, Small (1+), Moderate (2+), Large (3+), 4+   Urobilinogen, UA     Nitrite, UA negative    Leukocytes, UA Negative Negative   Appearance     Odor      Assessment & Plan:  High-risk pregnancy: G10P0090 at [redacted]w[redacted]d with an Estimated Date of Delivery: 06/01/24   1) SUA, EFW 10% (AC 16%), nl UAD  2) s/p ECV, still vertex  3) Wants waterbirth, went to class and sent certificate; understands providers at the hospital will determine eligibility  Meds: No orders of the defined types were placed in this encounter.   Labs/procedures today: SVE  Treatment Plan:  IOL @ 39wks  Reviewed: Term labor symptoms and general obstetric precautions including but not limited to vaginal bleeding, contractions, leaking of fluid and fetal movement  were reviewed in detail with the patient.  All questions were answered. Does have home bp cuff. Office bp cuff given: not applicable. Check bp daily, let us  know if consistently >140 and/or >90.  Follow-up: Return for none; please scan in birth plan to media tab.   Future Appointments  Date Time Provider Department Center  05/26/2024 12:00 AM [facility] SCHED ROOM [facility] None  07/08/2024 12:45 PM Whitfield Raisin, NP [facility] None  07/17/2024  1:00 PM Bevely Doffing, FNP [facility] [address]    Orders Placed This Encounter  Procedures   POC Urinalysis Dipstick OB   Suzen JONETTA Gentry CNM 05/22/2024 1:35 PM

## 2024-05-22 NOTE — Patient Instructions (Signed)
 Try the website [email or web address] to help the baby stay in a good position for labor.

## 2024-05-24 ENCOUNTER — Inpatient Hospital Stay (HOSPITAL_COMMUNITY)
Admission: AD | Admit: 2024-05-24 | Discharge: 2024-05-27 | DRG: 807 | Disposition: A | Discharge status: Home / Self Care | Attending: Obstetrics & Gynecology | Admitting: Obstetrics & Gynecology

## 2024-05-24 ENCOUNTER — Other Ambulatory Visit: Payer: Self-pay

## 2024-05-24 ENCOUNTER — Telehealth: Payer: Self-pay | Admitting: *Deleted

## 2024-05-24 ENCOUNTER — Encounter (HOSPITAL_COMMUNITY): Payer: Self-pay | Admitting: Obstetrics & Gynecology

## 2024-05-24 DIAGNOSIS — O26893 Other specified pregnancy related conditions, third trimester: Secondary | ICD-10-CM | POA: Diagnosis not present

## 2024-05-24 DIAGNOSIS — Z8249 Family history of ischemic heart disease and other diseases of the circulatory system: Secondary | ICD-10-CM

## 2024-05-24 DIAGNOSIS — Z87891 Personal history of nicotine dependence: Secondary | ICD-10-CM | POA: Diagnosis not present

## 2024-05-24 DIAGNOSIS — Q270 Congenital absence and hypoplasia of umbilical artery: Secondary | ICD-10-CM

## 2024-05-24 DIAGNOSIS — O365930 Maternal care for other known or suspected poor fetal growth, third trimester, not applicable or unspecified: Secondary | ICD-10-CM | POA: Diagnosis present

## 2024-05-24 DIAGNOSIS — Z3A39 39 weeks gestation of pregnancy: Secondary | ICD-10-CM | POA: Diagnosis not present

## 2024-05-24 DIAGNOSIS — O24420 Gestational diabetes mellitus in childbirth, diet controlled: Secondary | ICD-10-CM | POA: Diagnosis present

## 2024-05-24 DIAGNOSIS — K219 Gastro-esophageal reflux disease without esophagitis: Secondary | ICD-10-CM | POA: Diagnosis not present

## 2024-05-24 DIAGNOSIS — O4292 Full-term premature rupture of membranes, unspecified as to length of time between rupture and onset of labor: Secondary | ICD-10-CM | POA: Diagnosis not present

## 2024-05-24 DIAGNOSIS — O9962 Diseases of the digestive system complicating childbirth: Secondary | ICD-10-CM | POA: Diagnosis not present

## 2024-05-24 DIAGNOSIS — Z3A38 38 weeks gestation of pregnancy: Secondary | ICD-10-CM

## 2024-05-24 DIAGNOSIS — R8271 Bacteriuria: Secondary | ICD-10-CM

## 2024-05-24 DIAGNOSIS — O99824 Streptococcus B carrier state complicating childbirth: Secondary | ICD-10-CM | POA: Diagnosis present

## 2024-05-24 DIAGNOSIS — E66813 Obesity, class 3: Secondary | ICD-10-CM | POA: Diagnosis present

## 2024-05-24 DIAGNOSIS — O0990 Supervision of high risk pregnancy, unspecified, unspecified trimester: Principal | ICD-10-CM

## 2024-05-24 DIAGNOSIS — Z833 Family history of diabetes mellitus: Secondary | ICD-10-CM | POA: Diagnosis not present

## 2024-05-24 DIAGNOSIS — O99214 Obesity complicating childbirth: Secondary | ICD-10-CM | POA: Diagnosis not present

## 2024-05-24 LAB — POCT FERN TEST: POCT Fern Test: NEGATIVE

## 2024-05-24 LAB — CBC
HCT: 40 % (ref 36.0–46.0)
Hemoglobin: 13.6 g/dL (ref 12.0–15.0)
MCH: 30 pg (ref 26.0–34.0)
MCHC: 34 g/dL (ref 30.0–36.0)
MCV: 88.1 fL (ref 80.0–100.0)
Platelets: 190 K/uL (ref 150–400)
RBC: 4.54 MIL/uL (ref 3.87–5.11)
RDW: 13.7 % (ref 11.5–15.5)
WBC: 11.1 K/uL — ABNORMAL HIGH (ref 4.0–10.5)
nRBC: 0 % (ref 0.0–0.2)

## 2024-05-24 LAB — TYPE AND SCREEN
ABO/RH(D): A POS
Antibody Screen: NEGATIVE

## 2024-05-24 LAB — WET PREP, GENITAL
Clue Cells Wet Prep HPF POC: NONE SEEN
Sperm: NONE SEEN
Trich, Wet Prep: NONE SEEN
WBC, Wet Prep HPF POC: >=10 — AB (ref <10)
Yeast Wet Prep HPF POC: NONE SEEN

## 2024-05-24 LAB — RUPTURE OF MEMBRANE (ROM)PLUS: Rom Plus: POSITIVE

## 2024-05-24 MED ORDER — CEFAZOLIN SODIUM-DEXTROSE 1-4 GM/50ML-% IV SOLN
1.0000 g | Freq: Three times a day (TID) | INTRAVENOUS | Status: DC
  Administered 2024-05-25 – 2024-05-26 (×4): 1 g via INTRAVENOUS
  Filled 2024-05-24 (×4): qty 50

## 2024-05-24 MED ORDER — ACETAMINOPHEN 325 MG PO TABS: 650.0000 mg | ORAL_TABLET | ORAL | Status: DC | PRN

## 2024-05-24 MED ORDER — MISOPROSTOL 25 MCG QUARTER TABLET
25.0000 ug | ORAL_TABLET | ORAL | Status: DC | PRN
  Administered 2024-05-24: 25 ug via VAGINAL
  Filled 2024-05-24: qty 1

## 2024-05-24 MED ORDER — EPINEPHRINE 0.3 MG/0.3ML IJ SOAJ: 0.3000 mg | INTRAMUSCULAR | Status: DC | PRN

## 2024-05-24 MED ORDER — OXYTOCIN BOLUS FROM INFUSION
333.0000 mL | Freq: Once | INTRAVENOUS | Status: AC
  Administered 2024-05-26: 333 mL via INTRAVENOUS

## 2024-05-24 MED ORDER — SOD CITRATE-CITRIC ACID 500-334 MG/5ML PO SOLN: 30.0000 mL | ORAL | Status: DC | PRN

## 2024-05-24 MED ORDER — ZOLPIDEM TARTRATE 5 MG PO TABS: 5.0000 mg | ORAL_TABLET | Freq: Every evening | ORAL | Status: DC | PRN

## 2024-05-24 MED ORDER — MISOPROSTOL 25 MCG QUARTER TABLET: 25.0000 ug | ORAL_TABLET | ORAL | Status: DC | PRN

## 2024-05-24 MED ORDER — OXYCODONE-ACETAMINOPHEN 5-325 MG PO TABS: 2.0000 | ORAL_TABLET | ORAL | Status: DC | PRN

## 2024-05-24 MED ORDER — CEFAZOLIN SODIUM-DEXTROSE 2-4 GM/100ML-% IV SOLN
2.0000 g | Freq: Once | INTRAVENOUS | Status: AC
  Administered 2024-05-24: 2 g via INTRAVENOUS
  Filled 2024-05-24: qty 100

## 2024-05-24 MED ORDER — LACTATED RINGERS IV SOLN: INTRAVENOUS | Status: AC

## 2024-05-24 MED ORDER — OXYCODONE-ACETAMINOPHEN 5-325 MG PO TABS: 1.0000 | ORAL_TABLET | ORAL | Status: DC | PRN

## 2024-05-24 MED ORDER — ONDANSETRON HCL 4 MG/2ML IJ SOLN
4.0000 mg | Freq: Four times a day (QID) | INTRAMUSCULAR | Status: DC | PRN
  Administered 2024-05-25 – 2024-05-26 (×2): 4 mg via INTRAVENOUS
  Filled 2024-05-24 (×2): qty 2

## 2024-05-24 MED ORDER — TERBUTALINE SULFATE 1 MG/ML IJ SOLN: 0.2500 mg | Freq: Once | INTRAMUSCULAR | Status: DC | PRN

## 2024-05-24 MED ORDER — OXYTOCIN-SODIUM CHLORIDE 30-0.9 UT/500ML-% IV SOLN
1.0000 m[IU]/min | INTRAVENOUS | Status: DC
  Administered 2024-05-25: 2 m[IU]/min via INTRAVENOUS
  Filled 2024-05-24: qty 500

## 2024-05-24 MED ORDER — MISOPROSTOL 50MCG HALF TABLET
50.0000 ug | ORAL_TABLET | ORAL | Status: DC | PRN
  Administered 2024-05-24: 50 ug via BUCCAL
  Filled 2024-05-24: qty 1

## 2024-05-24 MED ORDER — LIDOCAINE HCL (PF) 1 % IJ SOLN: 30.0000 mL | INTRAMUSCULAR | Status: DC | PRN

## 2024-05-24 MED ORDER — LACTATED RINGERS IV SOLN: 500.0000 mL | INTRAVENOUS | Status: AC | PRN

## 2024-05-24 MED ORDER — ALBUTEROL SULFATE HFA 108 (90 BASE) MCG/ACT IN AERS: 2.0000 | INHALATION_SPRAY | RESPIRATORY_TRACT | Status: DC | PRN

## 2024-05-24 MED ORDER — OXYTOCIN-SODIUM CHLORIDE 30-0.9 UT/500ML-% IV SOLN: 2.5000 [IU]/h | INTRAVENOUS | Status: DC

## 2024-05-24 NOTE — Progress Notes (Signed)
 LABOR PROGRESS NOTE In to greet that patient at beginning of shift Patient comfortable without epidural.   FHT: baseline 150, mod variability, +accels, -decels; overall category I. Toco: rare  A/P:  Consider FB at next check  Barabara Maier, DO 10:08 PM

## 2024-05-24 NOTE — H&P (Addendum)
 OBSTETRIC ADMISSION HISTORY AND PHYSICAL  Katherine Mora is a 28 y.o. female G10P0090 with IUP at [redacted]w[redacted]d by 1st tri US  presenting for SROM with hx of FGR 10% and single umbilical artery. She reports +FMs, no VB, no blurry vision, change in headaches or peripheral edema, and RUQ pain. Has a history of migraines, and had a tension headache this morning, has not eaten today so she suspects it's from low blood sugar. She plans on breast feeding. She is undecided for birth control, but would like to decide outpatient. Presented to the MAU for leakage of clear fluid that started at 0700, ROM+ positive in MAU. Reports her father had an anaphylactic reaction to penicillin, she has had rash with a dose of amoxicillin a few years ago. She received her prenatal care at [facility]   Dating: By US  @ [redacted]w[redacted]d --->  Estimated Date of Delivery: 06/01/24  Sono:    @[redacted]w[redacted]d , CWD, anatomy notable for SUA, LVEICF, simple CPC, Breech presentation (s/p ECV 11/12), 2433g, 10% EFW   Prenatal History/Complications: none  Past Medical History: Past Medical History:  Diagnosis Date   Anxiety    Asthma    bipolar 1    Bipolar   Eczema    Fibromyalgia 2018   Heart palpitations    History of kidney stones    Hypoglycemia    Migraines    PONV (postoperative nausea and vomiting)     Past Surgical History: Past Surgical History:  Procedure Laterality Date   d and c N/A 2017   14 week size   DILATION AND EVACUATION N/A 08/01/2023   Procedure: DILATATION AND EVACUATION;  Surgeon: Jayne Vonn DEL, MD;  Location: [facility];  Service: Gynecology;  Laterality: N/A;   NASAL SEPTUM SURGERY Bilateral 2015   SINOSCOPY      Obstetrical History: OB History     Gravida  10   Para      Term      Preterm      AB  9   Living         SAB  9   IAB      Ectopic      Multiple      Live Births              Social History Social History   Socioeconomic History   Marital status: Single    Spouse  name: Not on file   Number of children: 0   Years of education: Not on file   Highest education level: Some college, no degree  Occupational History   Not on file  Tobacco Use   Smoking status: Former    Types: Cigarettes    Passive exposure: Current   Smokeless tobacco: Never  Vaping Use   Vaping status: Former  Substance and Sexual Activity   Alcohol use: Not Currently   Drug use: Not Currently    Types: Methamphetamines    Comment: Not used March 2024   Sexual activity: Yes    Birth control/protection: None  Other Topics Concern   Not on file  Social History Narrative   Right handed   Drinks soda (rare)   Waiting to get glasses    Social Drivers of Health   Financial Resource Strain: Medium Risk (07/24/2023)   Overall Financial Resource Strain (CARDIA)    Difficulty of Paying Living Expenses: Somewhat hard  Food Insecurity: No Food Insecurity (05/15/2024)   Hunger Vital Sign    Worried About Running Out  of Food in the Last Year: Never true    Ran Out of Food in the Last Year: Never true  Transportation Needs: No Transportation Needs (05/15/2024)   PRAPARE - Administrator, Civil Service (Medical): No    Lack of Transportation (Non-Medical): No  Physical Activity: Insufficiently Active (07/24/2023)   Exercise Vital Sign    Days of Exercise per Week: 3 days    Minutes of Exercise per Session: 30 min  Stress: No Stress Concern Present (07/24/2023)   [employer] of Occupational Health - Occupational Stress Questionnaire    Feeling of Stress : Only a little  Social Connections: Moderately Isolated (07/24/2023)   Social Connection and Isolation Panel    Frequency of Communication with Friends and Family: Twice a week    Frequency of Social Gatherings with Friends and Family: Once a week    Attends Religious Services: 1 to 4 times per year    Active Member of [employer] or Organizations: No    Attends Engineer, Structural: Never    Marital Status:  Never married    Family History: Family History  Problem Relation Age of Onset   Healthy Mother    Heart attack Father    Diabetes Father    Eczema Brother    Allergic rhinitis Brother    Urticaria Brother    Cancer Paternal Aunt        brain   Sarcoidosis Maternal Grandmother    Kidney failure Maternal Grandmother    Cancer Maternal Grandfather        skin   Colon cancer Neg Hx    Colon polyps Neg Hx     Allergies: Allergies  Allergen Reactions   Fentanyl  Shortness Of Breath and Rash    lethargic   Mometasone Furo-Formoterol Fum Shortness Of Breath and Nausea And Vomiting    Asthma   Montelukast Hives and Shortness Of Breath   Penicillins Anaphylaxis    Family History    Pitocin  [Oxytocin ] Hives    Family history, grandma anaplaxis, mom rash and hives   Tricyclic Antidepressants Other (See Comments)    Panic Attacks, Asthma Flare    Venlafaxine Hcl Other (See Comments)    Blurred vision, feeling faint   Clindamycin Rash   Latex Rash   Prednisone Anxiety and Rash    Medications Prior to Admission  Medication Sig Dispense Refill Last Dose/Taking   cetirizine  (ZYRTEC ) 10 MG tablet TAKE 1 TO 2 TABLETS BY MOUTH DAILY 60 tablet 0 05/23/2024 at 10:00 PM   MAGNESIUM PO Take 483 mg by mouth daily.   05/23/2024 at 10:00 PM   pantoprazole  (PROTONIX ) 20 MG tablet Take 1 tablet (20 mg total) by mouth daily. 30 tablet 6 05/23/2024 at 10:00 PM   Prenatal Vit-Fe Fumarate-FA (PRENATAL VITAMIN PO) Take 1 tablet by mouth daily.   05/23/2024 at 10:00 PM   VENTOLIN  HFA 108 (90 Base) MCG/ACT inhaler Inhale 2 puffs into the lungs every 4 (four) hours as needed for wheezing or shortness of breath.   Past Month   zafirlukast  (ACCOLATE ) 10 MG tablet TAKE 1 TABLET BY MOUTH TWICE A DAY. 60 tablet 0 05/23/2024 at 10:00 PM   EPINEPHrine  (EPIPEN  2-PAK) 0.3 mg/0.3 mL IJ SOAJ injection Inject 0.3 mg into the muscle as needed for anaphylaxis. (Patient not taking: Reported on 05/22/2024) 2 each 2       Review of Systems   All systems reviewed and negative except as stated in HPI  Blood pressure ROLLEN)  91/59, pulse 90, temperature 98.2 F (36.8 C), temperature source Oral, resp. rate 18, SpO2 97%, unknown if currently breastfeeding. General appearance: alert, cooperative, appears stated age, and no distress Lungs: clear to auscultation bilaterally Heart: regular rate and rhythm Abdomen: soft, non-tender; bowel sounds normal Pelvic: deferred Extremities: Homans sign is negative, no sign of DVT Presentation: cephalic by BSUS Fetal monitoringBaseline: 140 bpm, Variability: Good {> 6 bpm), Accelerations: Reactive, and Decelerations: Absent Uterine activity: None    Prenatal labs: NURSING  PROVIDER  Office Location [facility] Dating by U/S at 8.3 wks  [facility] Model Traditional Anatomy U/S Boy, SUA, LVEICF, simple CPC  Initiated care at  10wks                 Language  English               LAB RESULTS   Support Person   Genetics NIPS: LR female AFP:       NT/IT ([facility] only) negative      Carrier Screen Horizon: neg  Rhogam  A/Positive/-- (05/21 1346) A1C/GTT Early HgbA1C: 4.7 Third trimester 2 hr GTT: wnl  Flu Vaccine        TDaP Vaccine  declined Blood Type A/Positive/-- (05/21 1346)  RSV Vaccine   Antibody Negative (05/21 1346)  COVID Vaccine   Rubella 1.14 (05/21 1346)  Feeding Plan breast RPR Non Reactive (05/21 1346)  Contraception condoms HBsAg Negative (05/21 1346)  Circumcision Outpt at [facility] (no Vit K) HIV Non Reactive (05/21 1346)  Pediatrician  Considering [facility] (no vax) HCVAb Non Reactive (05/21 1346)  Prenatal Classes discussed      BTL Consent   Pap       Diagnosis  Date Value Ref Range Status  02/15/2021     Final    - Negative for intraepithelial lesion or malignancy (NILM)    BTL Pre-payment   GC/CT Initial: -/-  36wks:  neg  VBAC Consent   GBS For PCN allergy , check sensitivities   BRx Optimized? [ ]  yes   [ ]  no      DME Rx [ ]  BP cuff [ ]  Weight Scale  Waterbirth  [ ]  Class [ ]  Consent [ ]  CNM visit  PHQ9 & EDIN.DUHAMEL [  ] new OB [  ] 28 weeks  [  ] 36 weeks Induction  [ ]  Orders Entered [ ] Foley Y/N    Immunization History  Administered Date(s) Administered   DTaP 05/28/1997, 09/01/1997, 09/29/1997, 12/29/1998, 02/19/2002   HIB, Unspecified 05/28/1997, 09/01/1997, 12/29/1998   HPV Quadrivalent 09/09/2008, 11/10/2008, 03/10/2009   Hepatitis A, Ped/Adol-2 Dose 02/19/2002, 08/06/2004   Hepatitis B, PED/ADOLESCENT 05/28/1997, 09/01/1997, 12/29/1998   IPV 12/29/1998, 02/19/2002   MMR 03/31/1998, 02/19/2002   Meningococcal Conjugate 11/10/2008, 11/07/2013   OPV 05/28/1997, 09/01/1997   PPD Test 08/08/2016   Pneumococcal Polysaccharide-23 08/02/2016   Tdap 09/09/2008, 07/16/2019   Varicella 03/31/1998, 09/29/2005    Results for orders placed or performed during the hospital encounter of 05/24/24 (from the past 24 hours)  Wet prep, genital   Collection Time: 05/24/24 11:03 AM   Specimen: Cervix  Result Value Ref Range   Yeast Wet Prep HPF POC NONE SEEN NONE SEEN   Trich, Wet Prep NONE SEEN NONE SEEN   Clue Cells Wet Prep HPF POC NONE SEEN NONE SEEN   WBC, Wet Prep HPF POC >=10 (A) <10   Sperm NONE SEEN   POCT fern test   Collection Time: 05/24/24 11:13 AM  Result Value Ref Range   POCT [facility] Test Negative = intact amniotic membranes   Rupture of Membrane (ROM) Plus   Collection Time: 05/24/24 12:23 PM  Result Value Ref Range   Rom Plus POSITIVE     Patient Active Problem List   Diagnosis Date Noted   Normal labor 05/24/2024   Single umbilical artery 01/10/2024   GBS bacteriuria 12/02/2023   Supervision of high risk pregnancy, antepartum 11/21/2023   Hx of migraines 11/11/2022   Bipolar 1 disorder (HCC) 11/11/2022   History of methamphetamine abuse (HCC) 11/11/2022   Left breast lump 11/11/2022   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 G10P0090 at [redacted]w[redacted]d here for SROM with hx of FGR 10% and single umbilical artery.  #Labor: will start vaginal cytotec  and encourage ambulation. Will try to limit cervical exams due to SROM at 0700. #Pain: Desires water birth and Nitrous oxide #FWB: Category 1 #GBS status:  Positive and clincamycin resistant, history of family PCN anaphylaxis, will plan to start Cefazolin  and monitor closely for rxn. #Feeding: Breastmilk  #Reproductive Life planning: Undecided #Circ:  Yes, will be done OP because pt declining vitamin K  Fairy Amy, MD  05/24/2024, 1:46 PM  Extensively discussed/reviewed patient's extensive birth plan. She reports most things are preferences. She strongly does not want an epidural, but ok with a spinal. Discussed if need for emergency c-section she would require general anesthesia without an epidural in place. Patient expressed understanding and prefers general anesthesia in this even over an epidural.   She is open to forceps/vacuum, pitocin , Cytotec , C-section if medically indicated, but would prefer a minimum necessary approach.   Would like to avoid FSE if possible. Discuss before using.   Requests early lactation consult and assistance.   GME ATTESTATION:  Evaluation and management procedures were performed by the [facility] Medicine Resident under my supervision. I was immediately available for direct supervision, assistance and direction throughout this encounter.  I also confirm that I have verified the information documented in the resident's note, and that I have also personally reperformed the pertinent components of the physical exam and all of the medical decision making activities.  I have also made any necessary  editorial changes.  Leeroy KATHEE Pouch, MD OB Fellow, Faculty Practice [facility], Center for [facility] 05/24/2024 3:40 PM

## 2024-05-24 NOTE — Telephone Encounter (Signed)
 Patient called and stated that she believes that her water has broken. Pt reports water running down her legs when she got out of bed this morning. It has continued to happen and she has wet through a pad. Pt advised to go to MAU for evaluation. No other questions at this time.

## 2024-05-24 NOTE — MAU Provider Note (Signed)
 Chief Complaint:  Rupture of Membranes   HPI   None     Katherine Mora is a 28 y.o. G10P0090 at [redacted]w[redacted]d who presents to maternity admissions reporting that since 0700 she started leaking clear fluid and has continued to leak. Patient denies VB, CTX's and reports good FM's .   Her pregnancy is complicated by small for gestational age with an estimated fetal weight of 10% at [redacted]w[redacted]d with EFW = 2433.  The plan was for induction of labor at 39 weeks, patient also has a history of palpitations and tachycardia and has seen OB cardiology, history of asthma, GBS positive with clinda resistant, and patient declined all vaccines and is also declining erythromycin and vitamin K, Known  two-vessel cord   [facility]  Past Medical History:  Diagnosis Date   Anxiety    Asthma    bipolar 1    Bipolar   Eczema    Fibromyalgia 2018   Heart palpitations    History of kidney stones    Hypoglycemia    Migraines    PONV (postoperative nausea and vomiting)    OB History  Gravida Para Term Preterm AB Living  10    9   SAB IAB Ectopic Multiple Live Births  9        # Outcome Date GA Lbr Len/2nd Weight Sex Type Anes PTL Lv  10 Current           9 SAB 07/2023          8 SAB 10/12/21          7 SAB 2017 [redacted]w[redacted]d         6 SAB           5 SAB           4 SAB           3 SAB           2 SAB           1 SAB            Past Surgical History:  Procedure Laterality Date   d and c N/A 2017   14 week size   DILATION AND EVACUATION N/A 08/01/2023   Procedure: DILATATION AND EVACUATION;  Surgeon: Jayne Vonn DEL, MD;  Location: [facility];  Service: Gynecology;  Laterality: N/A;   NASAL SEPTUM SURGERY Bilateral 2015   SINOSCOPY     Family History  Problem Relation Age of Onset   Healthy Mother    Heart attack Father    Diabetes Father    Eczema Brother    Allergic rhinitis Brother    Urticaria Brother    Cancer Paternal Aunt        brain   Sarcoidosis Maternal Grandmother     Kidney failure Maternal Grandmother    Cancer Maternal Grandfather        skin   Colon cancer Neg Hx    Colon polyps Neg Hx    Social History   Tobacco Use   Smoking status: Former    Types: Cigarettes    Passive exposure: Current   Smokeless tobacco: Never  Vaping Use   Vaping status: Former  Substance Use Topics   Alcohol use: Not Currently   Drug use: Not Currently    Types: Methamphetamines    Comment: Not used March 2024   Allergies  Allergen Reactions   Fentanyl  Shortness Of Breath and Rash  lethargic   Mometasone Furo-Formoterol Fum Shortness Of Breath and Nausea And Vomiting    Asthma   Montelukast Hives and Shortness Of Breath   Penicillins Anaphylaxis    Family History    Pitocin  [Oxytocin ] Hives    Family history, grandma anaplaxis, mom rash and hives   Tricyclic Antidepressants Other (See Comments)    Panic Attacks, Asthma Flare    Venlafaxine Hcl Other (See Comments)    Blurred vision, feeling faint   Clindamycin Rash   Latex Rash   Prednisone Anxiety and Rash   Medications Prior to Admission  Medication Sig Dispense Refill Last Dose/Taking   cetirizine  (ZYRTEC ) 10 MG tablet TAKE 1 TO 2 TABLETS BY MOUTH DAILY 60 tablet 0 05/23/2024 at 10:00 PM   MAGNESIUM PO Take 483 mg by mouth daily.   05/23/2024 at 10:00 PM   pantoprazole  (PROTONIX ) 20 MG tablet Take 1 tablet (20 mg total) by mouth daily. 30 tablet 6 05/23/2024 at 10:00 PM   Prenatal Vit-Fe Fumarate-FA (PRENATAL VITAMIN PO) Take 1 tablet by mouth daily.   05/23/2024 at 10:00 PM   VENTOLIN  HFA 108 (90 Base) MCG/ACT inhaler Inhale 2 puffs into the lungs every 4 (four) hours as needed for wheezing or shortness of breath.   Past Month   zafirlukast  (ACCOLATE ) 10 MG tablet TAKE 1 TABLET BY MOUTH TWICE A DAY. 60 tablet 0 05/23/2024 at 10:00 PM   EPINEPHrine  (EPIPEN  2-PAK) 0.3 mg/0.3 mL IJ SOAJ injection Inject 0.3 mg into the muscle as needed for anaphylaxis. (Patient not taking: Reported on 05/22/2024) 2  each 2     I have reviewed patient's Past Medical Hx, Surgical Hx, Family Hx, Social Hx, medications and allergies.   ROS  Pertinent items noted in HPI and remainder of comprehensive ROS otherwise negative.   PHYSICAL EXAM  Patient Vitals for the past 24 hrs:  BP Temp Temp src Pulse Resp SpO2  05/24/24 1214 (!) 91/59 98.2 F (36.8 C) Oral 90 18 97 %  05/24/24 1040 126/76 -- -- (!) 101 20 97 %  05/24/24 1030 -- -- -- -- -- 96 %  05/24/24 1029 124/73 98.7 F (37.1 C) Oral (!) 112 18 97 %    Constitutional: Well-developed, well-nourished female in no acute distress.  Cardiovascular: normal rate & rhythm, warm and well-perfused Respiratory: normal effort, no problems with respiration noted GI: Abd soft, non-tender, gravid MS: Extremities nontender, no edema, normal ROM Neurologic: Alert and oriented x 4.  GU: no CVA tenderness Pelvic: Chaperoned by Bobbette Blumenthal RN   SSE: No pooling, no vaginal blood in the vault , physiological discharge present and cervix is visually closed ( Swabs  sent)  SVE: 1/Thick  Fetal Tracing: NST reactive Baseline: 150 Variability: moderate  Accelerations: present Decelerations: absent Toco: quite   Labs: Results for orders placed or performed during the hospital encounter of 05/24/24 (from the past 24 hours)  Wet prep, genital     Status: Abnormal   Collection Time: 05/24/24 11:03 AM   Specimen: Cervix  Result Value Ref Range   Yeast Wet Prep HPF POC NONE SEEN NONE SEEN   Trich, Wet Prep NONE SEEN NONE SEEN   Clue Cells Wet Prep HPF POC NONE SEEN NONE SEEN   WBC, Wet Prep HPF POC >=10 (A) <10   Sperm NONE SEEN   POCT fern test     Status: None   Collection Time: 05/24/24 11:13 AM  Result Value Ref Range   POCT Fern Test Negative = intact  amniotic membranes   Rupture of Membrane (ROM) Plus     Status: None   Collection Time: 05/24/24 12:23 PM  Result Value Ref Range   Rom Plus POSITIVE     Imaging:  Pt informed that the ultrasound is  considered a limited OB ultrasound and is not intended to be a complete ultrasound exam.  Patient also informed that the ultrasound is not being completed with the intent of assessing for fetal or placental anomalies or any pelvic abnormalities.  Explained that the purpose of today's ultrasound is to assess for  presentation.  Patient acknowledges the purpose of the exam and the limitations of the study.   VTX confirmed   MDM & MAU COURSE  MDM:  HIGH-> Admit to LD for PROM @ [redacted]w[redacted]d  GBS Positive Confirmed VTX by BSUS  Report given to Dr Trudy ( L/D OB Team)  MAU Course: Orders Placed This Encounter  Procedures   Wet prep, genital   Rupture of Membrane (ROM) Plus   Contraction - monitoring   External fetal heart monitoring   Vaginal exam   POCT fern test   Discharge patient Discharge disposition: 01-Home or Self Care; Discharge patient date: 05/24/2024    ASSESSMENT   1. Supervision of high risk pregnancy, antepartum   2. GBS bacteriuria   3. Single umbilical artery   4. Full-term premature rupture of membranes, unspecified duration to onset of labor   5. [redacted] weeks gestation of pregnancy     PLAN   Admission-> LD Team  Dr Trudy and LD Team aware GBS Positive Orders to follow from LD Team -----------------------------------------  Olam Dalton, MSN, [facility], Center for [facility]    This chart was dictated using voice recognition software, Dragon. Despite the best efforts of this provider to proofread and correct errors, errors may still occur which can change documentation meaning.

## 2024-05-24 NOTE — MAU Note (Signed)
 Katherine Mora is a 28 y.o. at [redacted]w[redacted]d here in MAU reporting: she began leaking clear fluid this morning at 0700 and has continued to leak since then.  Denies VB.  Endorses +FM.  Reports lower back pain and pelvic pressure.  LMP: NA Onset of complaint: today Pain score: 3 Vitals:   05/24/24 1029  BP: 124/73  Pulse: (!) 112  Resp: 18  Temp: 98.7 F (37.1 C)  SpO2: 97%     FHT: 150 bpm  Lab orders placed from triage: None

## 2024-05-25 LAB — SYPHILIS: RPR W/REFLEX TO RPR TITER AND TREPONEMAL ANTIBODIES, TRADITIONAL SCREENING AND DIAGNOSIS ALGORITHM: RPR Ser Ql: NONREACTIVE

## 2024-05-25 NOTE — Progress Notes (Signed)
 Labor Progress Note  Cervix remains unchanged after 4 hours. Discussed starting pitocin , patient would like to wait two hours before making that decision.

## 2024-05-25 NOTE — Progress Notes (Signed)
 LABOR PROGRESS NOTE Pt rechecked at 0040 Patient sitting on labor ball, reports cramping and can't sleep d/t this  SCE: 2/60/-3, cervix soft Cooks placed with speculum  FHT: baseline 130, mod variability, +accels, -decels; overall category I. Toco: q2-4 min  A/P:  Patient prefer non-pharmacologic induction and augmentation per birth plan. Defer pitocin  for now  [employer], DO 12:45 AM

## 2024-05-25 NOTE — Progress Notes (Signed)
 Labor Progress Note KYREN VAUX is a 28 y.o. G10P0090 at [redacted]w[redacted]d presented for SROM  S: Doing well.  O:  BP 123/79   Pulse 94   Temp 98.2 F (36.8 C) (Oral)   Resp 18   Ht 5' 3 (1.6 m)   Wt 113.8 kg   LMP  (LMP Unknown)   SpO2 97%   BMI 44.44 kg/m  EFM: 140/Moderate Variability/Accelerations (+),Decelerations (-)  CVE: Dilation: 6.5 Effacement (%): 80 Station: -2 Presentation: Vertex Exam by:: Dr. Magali   A&P: 28 y.o. [id number] [redacted]w[redacted]d  #Labor: Cervix remains unchanged. Discussed pitocin . Patient willing to start low dose pitocin  to get into active labor. #Pain: Per patient request #FWB: Category I #GBS positive > Ancef    Cheyane Ayon LITTIE Magali, MD 7:35 PM

## 2024-05-25 NOTE — Progress Notes (Signed)
 Katherine Mora is a 28 y.o. G10P0090 at [redacted]w[redacted]d admitted for PROM.  Subjective: Pt breathing with contractions, standing by the bed. S/O in room for support.    Objective: BP 130/84   Pulse 87   Temp 98.4 F (36.9 C) (Oral)   Resp 18   Ht 5' 3 (1.6 m)   Wt 113.8 kg   LMP  (LMP Unknown)   SpO2 100%   BMI 44.44 kg/m  No intake/output data recorded. No intake/output data recorded.  FHT:  FHR: 130 bpm, variability: moderate,  accelerations:  Present,  decelerations:  Absent UC:   regular, every 3-4 minutes SVE:   Dilation: 7 Effacement (%): 100 Station: -1 Exam by:: [employer]  Labs: Lab Results  Component Value Date   WBC 11.1 (H) 05/24/2024   HGB 13.6 05/24/2024   HCT 40.0 05/24/2024   MCV 88.1 05/24/2024   PLT 190 05/24/2024    Assessment / Plan: Augmentation of labor S/P Cytotec  and foley balloon  Labor: Pt initially declined Pitocin .  Discussed options with patient, including Pitocin , repeat dose of Cytotec , nipple stimulation and expectant management.  Discussed risks/benefits of each.  Pt desires to use nitrous oxide first, then start Pitocin  at 2 but prefers CNM discuss with pt and her husband each increase in rate.  I discussed with pt that this is not recommended, as patient will not want to know we are increasing pain each time and will opt to decline the increase to avoid pain.  But, I will honor pt wishes and discuss need for increase.  Pt still desires to use the tub if she can progress and Pitocin  can be turned off. Preeclampsia:  n/a Fetal Wellbeing:  Category I Pain Control:  Nitrous Oxide I/D:  GBS pos, PCN Anticipated MOD:  NSVD  Olam Boards, CNM 05/25/2024, 11:30 PM

## 2024-05-25 NOTE — Plan of Care (Signed)

## 2024-05-25 NOTE — Progress Notes (Addendum)
 Labor Progress Note Katherine Mora is a 28 y.o. G10P0090 at [redacted]w[redacted]d presented for SROM, hx FGR 10% and single umbilical artery  S: Labor progressing well. Pt ambulating. Tolerating contractions.   O:  BP 132/79   Pulse 88   Temp 98.2 F (36.8 C) (Oral)   Resp 18   Ht 5' 3 (1.6 m)   Wt 113.8 kg   LMP  (LMP Unknown)   SpO2 97%   BMI 44.44 kg/m  EFM: intermittent monitoring per pt preference; baseline 140, mod-marked variability, +accels  CVE: Dilation: 6.5 Effacement (%): 70 Station: -2 Presentation: Vertex Exam by:: Dr. Eldonna   A&P: 28 y.o. G10P0090 [redacted]w[redacted]d presented for SROM, hx FGR 10% and single umbilical artery  #Labor: Progressing well. Desires minimal interventions.  #Pain: per pt preference.  Planning natural delivery.  #FWB: intermittent monitoring ok per pt preference #GBS positive - cefazolin  running  Katherine JONELLE Carpen, MD 11:33 AM

## 2024-05-26 ENCOUNTER — Inpatient Hospital Stay (HOSPITAL_COMMUNITY): Admitting: Anesthesiology

## 2024-05-26 ENCOUNTER — Inpatient Hospital Stay (HOSPITAL_COMMUNITY): Admission: RE | Admit: 2024-05-26 | Payer: Self-pay | Admitting: Obstetrics & Gynecology

## 2024-05-26 ENCOUNTER — Inpatient Hospital Stay (HOSPITAL_COMMUNITY)

## 2024-05-26 ENCOUNTER — Encounter (HOSPITAL_COMMUNITY): Payer: Self-pay | Admitting: Obstetrics and Gynecology

## 2024-05-26 DIAGNOSIS — J45909 Unspecified asthma, uncomplicated: Secondary | ICD-10-CM | POA: Diagnosis not present

## 2024-05-26 DIAGNOSIS — Z3A38 38 weeks gestation of pregnancy: Secondary | ICD-10-CM

## 2024-05-26 DIAGNOSIS — O9952 Diseases of the respiratory system complicating childbirth: Secondary | ICD-10-CM | POA: Diagnosis not present

## 2024-05-26 DIAGNOSIS — O4202 Full-term premature rupture of membranes, onset of labor within 24 hours of rupture: Secondary | ICD-10-CM

## 2024-05-26 DIAGNOSIS — Z3A39 39 weeks gestation of pregnancy: Secondary | ICD-10-CM | POA: Diagnosis not present

## 2024-05-26 MED ORDER — LACTATED RINGERS IV SOLN: 500.0000 mL | Freq: Once | INTRAVENOUS | Status: DC

## 2024-05-26 MED ORDER — DIBUCAINE (PERIANAL) 1 % EX OINT: 1.0000 | TOPICAL_OINTMENT | CUTANEOUS | Status: DC | PRN

## 2024-05-26 MED ORDER — PHENYLEPHRINE 80 MCG/ML (10ML) SYRINGE FOR IV PUSH (FOR BLOOD PRESSURE SUPPORT)
80.0000 ug | PREFILLED_SYRINGE | INTRAVENOUS | Status: DC | PRN
Start: 2024-05-26 — End: 2024-05-26
  Filled 2024-05-26: qty 10

## 2024-05-26 MED ORDER — ONDANSETRON HCL 4 MG PO TABS: 4.0000 mg | ORAL_TABLET | ORAL | Status: DC | PRN

## 2024-05-26 MED ORDER — SODIUM CHLORIDE 0.9 % IV SOLN
12.0000 mL/h | INTRAVENOUS | Status: DC
  Filled 2024-05-26: qty 25

## 2024-05-26 MED ORDER — ALBUTEROL SULFATE (2.5 MG/3ML) 0.083% IN NEBU: 2.5000 mg | INHALATION_SOLUTION | RESPIRATORY_TRACT | Status: DC | PRN

## 2024-05-26 MED ORDER — COCONUT OIL OIL: 1.0000 | TOPICAL_OIL | Status: DC | PRN

## 2024-05-26 MED ORDER — DIPHENHYDRAMINE HCL 25 MG PO CAPS: 25.0000 mg | ORAL_CAPSULE | Freq: Four times a day (QID) | ORAL | Status: DC | PRN

## 2024-05-26 MED ORDER — SIMETHICONE 80 MG PO CHEW: 80.0000 mg | CHEWABLE_TABLET | ORAL | Status: DC | PRN

## 2024-05-26 MED ORDER — LIDOCAINE HCL (PF) 1 % IJ SOLN
INTRAMUSCULAR | Status: DC | PRN
  Administered 2024-05-26: 2 mL via EPIDURAL
  Administered 2024-05-26: 3 mL via EPIDURAL
  Administered 2024-05-26: 5 mL via EPIDURAL

## 2024-05-26 MED ORDER — IBUPROFEN 600 MG PO TABS
600.0000 mg | ORAL_TABLET | Freq: Four times a day (QID) | ORAL | Status: DC
  Administered 2024-05-26 – 2024-05-27 (×5): 600 mg via ORAL
  Filled 2024-05-26 (×5): qty 1

## 2024-05-26 MED ORDER — SODIUM CHLORIDE 0.9 % IV SOLN
INTRAVENOUS | Status: DC | PRN
  Administered 2024-05-26: 12 mL/h via EPIDURAL

## 2024-05-26 MED ORDER — PHENYLEPHRINE 80 MCG/ML (10ML) SYRINGE FOR IV PUSH (FOR BLOOD PRESSURE SUPPORT): 80.0000 ug | PREFILLED_SYRINGE | INTRAVENOUS | Status: DC | PRN

## 2024-05-26 MED ORDER — WITCH HAZEL-GLYCERIN EX PADS: 1.0000 | MEDICATED_PAD | CUTANEOUS | Status: DC | PRN

## 2024-05-26 MED ORDER — ACETAMINOPHEN 325 MG PO TABS: 650.0000 mg | ORAL_TABLET | ORAL | Status: DC | PRN

## 2024-05-26 MED ORDER — SENNOSIDES-DOCUSATE SODIUM 8.6-50 MG PO TABS
2.0000 | ORAL_TABLET | Freq: Every day | ORAL | Status: DC
  Administered 2024-05-27: 2 via ORAL
  Filled 2024-05-26: qty 2

## 2024-05-26 MED ORDER — BENZOCAINE-MENTHOL 20-0.5 % EX AERO: 1.0000 | INHALATION_SPRAY | CUTANEOUS | Status: DC | PRN

## 2024-05-26 MED ORDER — EPHEDRINE 5 MG/ML INJ: 10.0000 mg | INTRAVENOUS | Status: DC | PRN

## 2024-05-26 MED ORDER — PRENATAL MULTIVITAMIN CH
1.0000 | ORAL_TABLET | Freq: Every day | ORAL | Status: DC
  Administered 2024-05-26: 1 via ORAL
  Filled 2024-05-26 (×2): qty 1

## 2024-05-26 MED ORDER — SODIUM CHLORIDE 0.9 % IV SOLN: 12.0000 mL/h | INTRAVENOUS | Status: DC

## 2024-05-26 MED ORDER — DIPHENHYDRAMINE HCL 50 MG/ML IJ SOLN: 12.5000 mg | INTRAMUSCULAR | Status: DC | PRN

## 2024-05-26 MED ORDER — ONDANSETRON HCL 4 MG/2ML IJ SOLN: 4.0000 mg | INTRAMUSCULAR | Status: DC | PRN

## 2024-05-26 MED ORDER — TETANUS-DIPHTH-ACELL PERTUSSIS 5-2-15.5 LF-MCG/0.5 IM SUSP: 0.5000 mL | Freq: Once | INTRAMUSCULAR | Status: DC

## 2024-05-26 MED ORDER — FENTANYL-BUPIVACAINE-NACL 0.5-0.125-0.9 MG/250ML-% EP SOLN
12.0000 mL/h | EPIDURAL | Status: DC | PRN
  Filled 2024-05-26: qty 250

## 2024-05-26 MED ORDER — FENTANYL-BUPIVACAINE-NACL 0.5-0.125-0.9 MG/250ML-% EP SOLN: 12.0000 mL/h | EPIDURAL | Status: DC | PRN

## 2024-05-26 MED ORDER — ZOLPIDEM TARTRATE 5 MG PO TABS
5.0000 mg | ORAL_TABLET | Freq: Every evening | ORAL | Status: DC | PRN
Start: 2024-05-26 — End: 2024-05-27

## 2024-05-26 NOTE — Lactation Note (Signed)
 This note was copied from a baby's chart. Lactation Consultation Note  Patient Name: [name] Date: 05/26/2024 Age:27 hours Reason for consult: Initial assessment;1st time breastfeeding;RN request;Breastfeeding assistance  P1,  Mother states she has had difficulty latching baby on the R breast.  Unwrapped baby for feeding.  Had mother hand express and prepump with manual pump and attempt was made but baby did not wake to feed.  Baby has latched x 2 since birth.  Manual pump fitted with 17 mm flange insert that mother provided.  She knows to adjust flange size for comfort.  Reviewed basics. Feed on demand with cues.  Goal 8-12+ times per day after first 24 hrs.  Place baby STS if not cueing.   Maternal Data Has patient been taught Hand Expression?: Yes Does the patient have breastfeeding experience prior to this delivery?: No  Feeding Mother's Current Feeding Choice: Breast Milk   Lactation Tools Discussed/Used Tools: Pump;Flanges Flange Size: 18 Breast pump type: Manual Pump Education: Setup, frequency, and cleaning;Milk Storage Reason for Pumping: stimulation Pumping frequency: PRN  Interventions Interventions: Breast feeding basics reviewed;Assisted with latch;Skin to skin;Hand express;Pre-pump if needed;Position options;Support pillows;Hand pump;Education;LC Services brochure;CDC milk storage guidelines  Discharge Pump: Personal;Hands Free (Elvie St[employer])  Consult Status Consult Status: Follow-up Date: 05/27/24 Follow-up type: In-patient   Shannon Levorn Lemme  RN, IBCLC 05/26/2024, 3:15 PM

## 2024-05-26 NOTE — Progress Notes (Signed)
 Patient was checked as requested and remained unchanged. Epidural requested. Bupivacaine  epidural ordered promptly after request. Pharmacy called this RN to verify epidural order. Pharmacist directed to [facility] anesthesiologist. Pharmacist called this RN to discuss orders placed by OB anesthesia. This RN called OB anesthesia to verify orders. A regular epidural with fentanyl  was recommended and ordered for patient by anesthesiologist.This RN educated patient on MD recommendation, patient refused (she believes that she is allergic to fentanyl ). RN called MD back to make him aware of refusal and requested that he go and speak with the patient about why he recommends a regular epidural over one without fentanyl . Patient handed off to 2nd RN (see flowsheet). Bupivacaine  epidural placed shortly after handoff.

## 2024-05-26 NOTE — Progress Notes (Signed)
 This RN entered patient room to increase pitocin  from 2 to 4. This RN informed the patient as requested, patient refused. Provider notified and aware.

## 2024-05-26 NOTE — Anesthesia Procedure Notes (Signed)
 Epidural Patient location during procedure: OB Start time: 05/26/2024 1:33 AM End time: 05/26/2024 1:41 AM  Staffing Anesthesiologist: Corinne Garnette BRAVO, MD Performed: anesthesiologist   Preanesthetic Checklist Completed: patient identified, IV checked, risks and benefits discussed, monitors and equipment checked, pre-op evaluation and timeout performed  Epidural Patient position: sitting Prep: DuraPrep Patient monitoring: blood pressure and continuous pulse ox Approach: midline Location: L3-L4 Injection technique: LOR air  Needle:  Needle type: Tuohy  Needle gauge: 17 G Needle length: 9 cm Needle insertion depth: 6 cm Catheter size: 19 Gauge Catheter at skin depth: 11 cm Test dose: negative and Other (1% Lidocaine )  Additional Notes Patient identified.  Risk benefits discussed including failed block, incomplete pain control, headache, nerve damage, paralysis, blood pressure changes, nausea, vomiting, reactions to medication both toxic or allergic, and postpartum back pain.  Patient expressed understanding and wished to proceed.  All questions were answered.  Sterile technique used throughout procedure and epidural site dressed with sterile barrier dressing. No paresthesia or other complications noted. The patient did not experience any signs of intravascular injection such as tinnitus or metallic taste in mouth nor signs of intrathecal spread such as rapid motor block. Please see nursing notes for vital signs. Reason for block:procedure for pain

## 2024-05-26 NOTE — Anesthesia Preprocedure Evaluation (Signed)
 Anesthesia Evaluation  Patient identified by MRN, date of birth, ID band Patient awake    Reviewed: Allergy  & Precautions, NPO status , Patient's Chart, lab work & pertinent test results  History of Anesthesia Complications (+) PONV and history of anesthetic complications  Airway Mallampati: III  TM Distance: >3 FB Neck ROM: Full    Dental  (+) Teeth Intact, Dental Advisory Given   Pulmonary asthma , Patient abstained from smoking., former smoker   Pulmonary exam normal breath sounds clear to auscultation       Cardiovascular Exercise Tolerance: Good negative cardio ROS Normal cardiovascular exam Rhythm:Regular Rate:Normal     Neuro/Psych  Headaches PSYCHIATRIC DISORDERS Anxiety  Bipolar Disorder      GI/Hepatic Neg liver ROS,GERD  Medicated,,  Endo/Other    Class 3 obesity  Renal/GU negative Renal ROS     Musculoskeletal  (+)  Fibromyalgia -  Abdominal   Peds  Hematology negative hematology ROS (+) Plt 190k   Anesthesia Other Findings Day of surgery medications reviewed with the patient.  Reproductive/Obstetrics                              Anesthesia Physical Anesthesia Plan  ASA: 3  Anesthesia Plan: Epidural   Post-op Pain Management:    Induction:   PONV Risk Score and Plan: 2 and Treatment may vary due to age or medical condition  Airway Management Planned: Natural Airway  Additional Equipment:   Intra-op Plan:   Post-operative Plan:   Informed Consent: I have reviewed the patients History and Physical, chart, labs and discussed the procedure including the risks, benefits and alternatives for the proposed anesthesia with the patient or authorized representative who has indicated his/her understanding and acceptance.     Dental advisory given  Plan Discussed with:   Anesthesia Plan Comments: (Patient identified. Risks/Benefits/Options discussed with patient  including but not limited to bleeding, infection, nerve damage, paralysis, failed block, incomplete pain control, headache, blood pressure changes, nausea, vomiting, reactions to medication both or allergic, itching and postpartum back pain. Confirmed with bedside nurse the patient's most recent platelet count. Confirmed with patient that they are not currently taking any anticoagulation, have any bleeding history or any family history of bleeding disorders. Patient expressed understanding and wished to proceed. All questions were answered. )        Anesthesia Quick Evaluation

## 2024-05-26 NOTE — Discharge Summary (Signed)
 Postpartum Discharge Summary  Date of Service updated***     Patient Name: Katherine Mora DOB: 21-Oct-1995 MRN: [id number]  Date of admission: 05/24/2024 Delivery date:05/26/2024 Delivering provider: MILLY PLANAS A Date of discharge: 05/26/2024  Admitting diagnosis: Normal labor [O80, Z37.9] Intrauterine pregnancy: [redacted]w[redacted]d     Secondary diagnosis:  Principal Problem:   Normal labor  Additional problems: none    Discharge diagnosis: Term Pregnancy Delivered                                              Post partum procedures:{Postpartum procedures:23558} Augmentation: AROM, Pitocin , Cytotec , and IP Foley Complications: None  Hospital course: Induction of Labor With Vaginal Delivery   28 y.o. yo [id number] at [redacted]w[redacted]d was admitted to the hospital 05/24/2024 for induction of labor.  Indication for induction: PROM.  Patient had an labor course complicated by PROM x 40+ hours Membrane Rupture Time/Date: 7:00 AM,05/24/2024  Delivery Method:Vaginal, Spontaneous Operative Delivery:N/A Episiotomy: None Lacerations:  None Details of delivery can be found in separate delivery note.  Patient had an uncomplicated postpartum course. Patient is discharged home 05/26/24.  Newborn Data: Birth date:05/26/2024 Birth time:7:44 AM Gender:Female Living status:Living Apgars:8 ,9  Weight:3240 g  Magnesium Sulfate received: No BMZ received: No Rhophylac:No MMR:No T-DaP:declined Flu: No RSV Vaccine received: No Transfusion:No  Immunizations received: Immunization History  Administered Date(s) Administered   DTaP 05/28/1997, 09/01/1997, 09/29/1997, 12/29/1998, 02/19/2002   HIB, Unspecified 05/28/1997, 09/01/1997, 12/29/1998   HPV Quadrivalent 09/09/2008, 11/10/2008, 03/10/2009   Hepatitis A, Ped/Adol-2 Dose 02/19/2002, 08/06/2004   Hepatitis B, PED/ADOLESCENT 05/28/1997, 09/01/1997, 12/29/1998   IPV 12/29/1998, 02/19/2002   MMR 03/31/1998, 02/19/2002   Meningococcal Conjugate  11/10/2008, 11/07/2013   OPV 05/28/1997, 09/01/1997   PPD Test 08/08/2016   Pneumococcal Polysaccharide-23 08/02/2016   Tdap 09/09/2008, 07/16/2019   Varicella 03/31/1998, 09/29/2005    Physical exam  Vitals:   05/26/24 0701 05/26/24 0734 05/26/24 0800 05/26/24 0802  BP: 104/60 (!) 118/52 137/72   Pulse: 87 87 94   Resp:  18 18 18   Temp:      TempSrc:      SpO2:      Weight:      Height:       General: {Exam; general:21111117} Lochia: {Desc; appropriate/inappropriate:30686::appropriate} Uterine Fundus: {Desc; firm/soft:30687} Incision: {Exam; incision:21111123} DVT Evaluation: {Exam; dvt:2111122} Labs: Lab Results  Component Value Date   WBC 11.1 (H) 05/24/2024   HGB 13.6 05/24/2024   HCT 40.0 05/24/2024   MCV 88.1 05/24/2024   PLT 190 05/24/2024      Latest Ref Rng & Units 02/23/2024    4:22 AM  CMP  Glucose 70 - 99 mg/dL 96   BUN 6 - 20 mg/dL 8   Creatinine 9.55 - 8.99 mg/dL 9.45   Sodium 864 - 854 mmol/L 137   Potassium 3.5 - 5.1 mmol/L 4.1   Chloride 98 - 111 mmol/L 106   CO2 22 - 32 mmol/L 21   Calcium 8.9 - 10.3 mg/dL 9.2   Total Protein 6.5 - 8.1 g/dL 6.4   Total Bilirubin 0.0 - 1.2 mg/dL 0.4   Alkaline Phos 38 - 126 U/L 51   AST 15 - 41 U/L 20   ALT 0 - 44 U/L 20    Edinburgh Score:     No data to display  No data recorded  After visit meds:  Allergies as of 05/26/2024       Reactions   Fentanyl  Shortness Of Breath, Rash   lethargic   Mometasone Furo-formoterol Fum Shortness Of Breath, Nausea And Vomiting   Asthma   Montelukast Hives, Shortness Of Breath   Penicillins Anaphylaxis   Family History    Pitocin  [oxytocin ] Hives   Family history, grandma anaplaxis, mom rash and hives   Tricyclic Antidepressants Other (See Comments)   Panic Attacks, Asthma Flare    Venlafaxine Hcl Other (See Comments)   Blurred vision, feeling faint   Clindamycin Rash   Latex Rash   Prednisone Anxiety, Rash     Med Rec must be completed  prior to using this SMARTLINK***        Discharge home in stable condition Infant Feeding: {Baby feeding:23562} Infant Disposition:{CHL IP OB HOME WITH FNUYZM:76418} Discharge instruction: per After Visit Summary and Postpartum booklet. Activity: Advance as tolerated. Pelvic rest for 6 weeks.  Diet: {OB ipzu:[id number]} Future Appointments: Future Appointments  Date Time Provider Department Center  07/08/2024 12:45 PM Whitfield Raisin, NP [facility] None  07/17/2024  1:00 PM Bevely Doffing, FNP [facility] [address]   Follow up Visit:  Message sent to [facility] on 05/26/24:  Please schedule this patient for a In person postpartum visit in 6 weeks with the following provider: Any provider. Additional Postpartum F/U:***  Low risk pregnancy complicated by: PPROM without onset of labor Delivery mode:  Vaginal, Spontaneous Anticipated Birth Control:  Unsure   05/26/2024 Olam Boards, CNM

## 2024-05-27 ENCOUNTER — Other Ambulatory Visit (HOSPITAL_COMMUNITY): Payer: Self-pay

## 2024-05-27 ENCOUNTER — Encounter: Admitting: Obstetrics & Gynecology

## 2024-05-27 LAB — CBC
HCT: 37.4 % (ref 36.0–46.0)
Hemoglobin: 12.4 g/dL (ref 12.0–15.0)
MCH: 30 pg (ref 26.0–34.0)
MCHC: 33.2 g/dL (ref 30.0–36.0)
MCV: 90.3 fL (ref 80.0–100.0)
Platelets: 176 K/uL (ref 150–400)
RBC: 4.14 MIL/uL (ref 3.87–5.11)
RDW: 13.8 % (ref 11.5–15.5)
WBC: 10.5 K/uL (ref 4.0–10.5)
nRBC: 0 % (ref 0.0–0.2)

## 2024-05-27 LAB — GC/CHLAMYDIA PROBE AMP (~~LOC~~) NOT AT ARMC
Chlamydia: NEGATIVE
Comment: NEGATIVE
Comment: NORMAL
Neisseria Gonorrhea: NEGATIVE

## 2024-05-27 MED ORDER — WITCH HAZEL-GLYCERIN EX PADS: 1.0000 | MEDICATED_PAD | CUTANEOUS | Status: DC | PRN

## 2024-05-27 MED ORDER — DIBUCAINE (PERIANAL) 1 % EX OINT: 1.0000 | TOPICAL_OINTMENT | CUTANEOUS | Status: DC | PRN

## 2024-05-27 MED ORDER — ACETAMINOPHEN 325 MG PO TABS
650.0000 mg | ORAL_TABLET | ORAL | 0 refills | Status: AC | PRN
  Filled 2024-05-27: qty 30, 3d supply, fill #0

## 2024-05-27 MED ORDER — COCONUT OIL OIL: 1.0000 | TOPICAL_OIL | Status: DC | PRN

## 2024-05-27 MED ORDER — IBUPROFEN 600 MG PO TABS
600.0000 mg | ORAL_TABLET | Freq: Four times a day (QID) | ORAL | 0 refills | Status: DC
  Filled 2024-05-27: qty 30, 8d supply, fill #0

## 2024-05-27 MED ORDER — BENZOCAINE-MENTHOL 20-0.5 % EX AERO: 1.0000 | INHALATION_SPRAY | CUTANEOUS | Status: DC | PRN

## 2024-05-27 NOTE — Anesthesia Postprocedure Evaluation (Signed)
 Anesthesia Post Note  Patient: Katherine Mora  Procedure(s) Performed: AN AD HOC LABOR EPIDURAL     Patient location during evaluation: Mother Baby Anesthesia Type: Epidural Level of consciousness: awake and alert and oriented Pain management: satisfactory to patient Vital Signs Assessment: post-procedure vital signs reviewed and stable Respiratory status: respiratory function stable Cardiovascular status: stable Postop Assessment: no headache, no backache, epidural receding, patient able to bend at knees, no signs of nausea or vomiting, adequate PO intake and able to ambulate Anesthetic complications: no   No notable events documented.  Last Vitals:  Vitals:   05/26/24 2051 05/27/24 0546  BP: 124/84 115/77  Pulse: 77 79  Resp: 18 18  Temp: 37.1 C 37.1 C  SpO2: 97% 98%    Last Pain:  Vitals:   05/27/24 0724  TempSrc:   PainSc: 0-No pain   Pain Goal:                Epidural/Spinal Function Cutaneous sensation: Normal sensation (05/27/24 0812)  PURNELL PORTAL

## 2024-05-27 NOTE — Clinical Social Work Maternal (Signed)
 CLINICAL SOCIAL WORK MATERNAL/CHILD NOTE  Patient Details  Name: Katherine Mora MRN: [id number] Date of Birth: [birth year]  Date:  05/27/2024  Clinical Social Worker Initiating Note:  Jadelynn Boylan Date/Time: Initiated:  05/27/24/1348     Child's Name:      Biological Parents:  Mother, Father Katherine Mora 18-Jun-1997, Katherine Mora 07/09/1992)   Need for Interpreter:  None   Reason for Referral:  Behavioral Health Concerns   Address:  [address] [place] [address]    Phone number:  [phone] (home)     Additional phone number:   Household Members/Support Persons (HM/SP):   Household Member/Support Person 1   HM/SP Name Relationship DOB or Age  HM/SP -1 Lonni Duffy FOB 07/09/1992  HM/SP -2        HM/SP -3        HM/SP -4        HM/SP -5        HM/SP -6        HM/SP -7        HM/SP -8          Natural Supports (not living in the home):  Friends   Professional Supports: Radiographer, Therapeutic)   Employment: Full-time   Type of Work: Librarian, Academic   Education:  9 to 11 years   Homebound arranged: No  Financial Resources:  [employer]   Other Resources:  Sales [employer]  , [employer]   Cultural/Religious Considerations Which May Impact Care:    Strengths:  Ability to meet basic needs  , Home prepared for child  , Pediatrician chosen   Psychotropic Medications:         Pediatrician:    [facility] area  Pediatrician List:   [facility]  [place]    [place]      Pediatrician Fax Number:    Risk Factors/Current Problems:  None   Cognitive State:  Able to Concentrate  , Alert     Mood/Affect:  Calm  , Comfortable     CSW Assessment: CSW received a consult for MOB hx of Bipolar 27 Disorder, Anxiety and Marijuana use, CSW met with MOB to complete assessment and offer support. CSW entered the room and observed MOB nursing the 27 infant and FOB at  bedside. CSW introduced self and requested to speak to MOB in private, MOB allowed FOB to remain in the room during the assessment. CSW inquired about how MOB was feeling, MOB reported good. CSW confirmed MOB address and phone number MOB reported the information on file was correct. CSW inquired about MOB MH hx, MOB reported she was diagnosed at age 28. MOB reported she does not currently take any medication due the pregnancy  but she sees her therapist twice a month at [facility]. CSW inquired about MOB last manic episode, MOB reported it was May 2024, MOB reported a stable mood throughout pregnancy. CSW assessed for safety, MOB denied any SI or HI. CSW provided education regarding the baby blues period vs. perinatal mood disorders, discussed treatment and gave resources for mental health follow up if concerns arise.  CSW recommends self-evaluation during the postpartum time period using the New Mom Checklist from Postpartum Progress and encouraged MOB to contact a medical professional if symptoms are noted at any time.  MOB identified FOB as her primary support.   CSW inquired about THC use, MOB denied THC use during  pregnancy, no indication in OB records MOB used THC during pregnancy   CSW provided review of Sudden Infant Death Syndrome (SIDS) precautions.  MOB reported they have all essential item for the infant including a bassinet, crib and car seat. CSW identifies no further need for intervention and no barriers to discharge at this time.  CSW Plan/Description:  No Further Intervention Required/No Barriers to Discharge, Sudden Infant Death Syndrome (SIDS) Education, Perinatal Mood and Anxiety Disorder (PMADs) Education    Eliazar CHRISTELLA Gave, LCSW 05/27/2024, 2:03 PM

## 2024-05-27 NOTE — Lactation Note (Signed)
 This note was copied from a baby's chart. Lactation Consultation Note  Patient Name: Katherine Mora Unijb'd Date: 05/27/2024 Age:28, P1  Reason for consult: Follow-up assessment;Primapara;1st time breastfeeding;Term;Infant weight loss (4 % weight loss,) Per mom the baby recently fed 15 mins and dad is holding the baby.  Per mom the baby latches better on the left compared to the right. LC offered to assess the breast tissue and mom receptive.  LC noted areola edema, and instructed mom on the use of the reverse pressure exercise and the areola more compressible.  Mom easily hand expressed.  Per mom plans to go home early.  LC reviewed breast feeding D/C teaching and the [facility] resources.  Mom has the handouts for Storage of breast milk and for [facility] resources.   Maternal Data Has patient been taught Hand Expression?: Yes Does the patient have breastfeeding experience prior to this delivery?: No  Feeding Mother's Current Feeding Choice: Breast Milk  LATCH Score ( Latch score by the [facility] nurse )  Latch: Grasps breast easily, tongue down, lips flanged, rhythmical sucking.  Audible Swallowing: A few with stimulation  Type of Nipple: Everted at rest and after stimulation  Comfort (Breast/Nipple): Soft / non-tender  Hold (Positioning): No assistance needed to correctly position infant at breast.  LATCH Score: 9   Lactation Tools Discussed/Used Tools: Pump;Flanges Flange Size: 18 Breast pump type: Manual Pump Education: Setup, frequency, and cleaning;Milk Storage Pumping frequency: PRN when the milk comes  Interventions Interventions: Breast feeding basics reviewed;Hand pump;Education;LC Services brochure;CDC milk storage guidelines;CDC Guidelines for Breast Pump Cleaning  Discharge Discharge Education: Engorgement and breast care;Warning signs for feeding baby;Outpatient recommendation (if needed) Pump: Personal;Hands Free;Manual  Consult Status Consult Status:  Complete Date: 05/27/24    Rollene Jenkins Fiedler 05/27/2024, 9:39 AM

## 2024-06-03 ENCOUNTER — Other Ambulatory Visit: Payer: Self-pay | Admitting: Allergy & Immunology

## 2024-06-05 ENCOUNTER — Telehealth (HOSPITAL_COMMUNITY): Payer: Self-pay | Admitting: *Deleted

## 2024-06-05 NOTE — Telephone Encounter (Signed)
 06/05/2024  Name: Katherine Mora MRN: [id number] DOB: [birth year]  Reason for Call:  Transition of Care Hospital Discharge Call  Contact Status: Patient Contact Status: Complete  Language assistant needed:          Follow-Up Questions: Do You Have Any Concerns About Your Health As You Heal From Delivery?: Yes What Concerns Do You Have About Your Health?: Patient stated, I get lightheaded and dizzy sometimes when I breastfeed. It doesn't happen all the time, but last night it was so bad that it made me nauseous. I have a history of migraines, and in the past, the only thing that has helped was a bath. RN encouraged patient to contact her OB to schedule an appointment for evaluation. Also suggested that patient leave a message on the lactation warm-line. Patient verbalized understanding. Patient stated that she has lactation phone number. No other questions or concerns voiced at this time. Do You Have Any Concerns About Your Infants Health?: No  Edinburgh Postnatal Depression Scale:  In the Past 7 Days:   Patient reported that her answers are the same as when she completed the EPDS in the hospital. Score at that time was 0. Stated that she is "doing well" and has good support. EPDS not completed at this time. PHQ2-9 Depression Scale:     Discharge Follow-up: Edinburgh score requires follow up?: No Patient was advised of the following resources:: Breastfeeding Support Group, Support Group Patient referred to:: OB Requested email information - sent by RN Post-discharge interventions: Reviewed Newborn Safe Sleep Practices  Signature Allean IVAR Carton, RN, 06/05/24, [phone]

## 2024-06-10 ENCOUNTER — Encounter: Payer: Self-pay | Admitting: Advanced Practice Midwife

## 2024-06-10 ENCOUNTER — Ambulatory Visit: Admitting: Advanced Practice Midwife

## 2024-06-10 VITALS — BP 110/73 | HR 101 | Ht 64.0 in | Wt 231.4 lb

## 2024-06-10 DIAGNOSIS — R42 Dizziness and giddiness: Secondary | ICD-10-CM | POA: Diagnosis not present

## 2024-06-10 DIAGNOSIS — Z9189 Other specified personal risk factors, not elsewhere classified: Secondary | ICD-10-CM

## 2024-06-10 NOTE — Progress Notes (Signed)
 [facility] Visit  Patient name: Katherine Mora MRN [id number]  Date of birth: 1995/07/09  CC & HPI:  Katherine Mora is a 28 y.o. Caucasian female presenting today for I get lightheaded and dizzy sometimes when I breastfeed. It doesn't happen all the time, but last night it was so bad that it made me nauseous.  Happens even when on her side BF or pumping. Gets a HA at times, as well    Pertinent History Reviewed:  Medical & Surgical Hx:   Past Medical History:  Diagnosis Date   Anxiety    Asthma    bipolar 1    Bipolar   Eczema    Fibromyalgia 2018   Heart palpitations    History of kidney stones    Hypoglycemia    Migraines    PONV (postoperative nausea and vomiting)    Past Surgical History:  Procedure Laterality Date   d and c N/A 2017   14 week size   DILATION AND EVACUATION N/A 08/01/2023   Procedure: DILATATION AND EVACUATION;  Surgeon: Jayne Vonn DEL, MD;  Location: [facility];  Service: Gynecology;  Laterality: N/A;   NASAL SEPTUM SURGERY Bilateral 2015   SINOSCOPY     Family History  Problem Relation Age of Onset   Healthy Mother    Heart attack Father    Diabetes Father    Eczema Brother    Allergic rhinitis Brother    Urticaria Brother    Cancer Paternal Aunt        brain   Sarcoidosis Maternal Grandmother    Kidney failure Maternal Grandmother    Cancer Maternal Grandfather        skin   Colon cancer Neg Hx    Colon polyps Neg Hx     Current Outpatient Medications:    cetirizine  (ZYRTEC ) 10 MG tablet, TAKE 1 TO 2 TABLETS BY MOUTH DAILY, Disp: 60 tablet, Rfl: 0   MAGNESIUM PO, Take 483 mg by mouth daily., Disp: , Rfl:    pantoprazole  (PROTONIX ) 20 MG tablet, Take 1 tablet (20 mg total) by mouth daily., Disp: 30 tablet, Rfl: 6   Prenatal Vit-Fe Fumarate-FA (PRENATAL VITAMIN PO), Take 1 tablet by mouth daily., Disp: , Rfl:    VENTOLIN  HFA 108 (90 Base) MCG/ACT inhaler, Inhale 2 puffs into the lungs every 4 (four) hours as needed  for wheezing or shortness of breath., Disp: , Rfl:    zafirlukast  (ACCOLATE ) 10 MG tablet, TAKE 1 TABLET BY MOUTH TWICE A DAY., Disp: 60 tablet, Rfl: 0   acetaminophen  (TYLENOL ) 325 MG tablet, Take 2 tablets (650 mg total) by mouth every 4 (four) hours as needed (for pain scale < 4). (Patient not taking: Reported on 06/10/2024), Disp: 30 tablet, Rfl: 0   benzocaine -Menthol  (DERMOPLAST) 20-0.5 % AERO, Apply 1 Application topically as needed for irritation (perineal discomfort). (Patient not taking: Reported on 06/10/2024), Disp: , Rfl:    coconut oil OIL, Apply 1 Application topically as needed. (Patient not taking: Reported on 06/10/2024), Disp: , Rfl:    dibucaine (NUPERCAINAL) 1 % OINT, Place 1 Application rectally as needed for hemorrhoids. (Patient not taking: Reported on 06/10/2024), Disp: , Rfl:    EPINEPHrine  (EPIPEN  2-PAK) 0.3 mg/0.3 mL IJ SOAJ injection, Inject 0.3 mg into the muscle as needed for anaphylaxis. (Patient not taking: Reported on 06/10/2024), Disp: 2 each, Rfl: 2   ibuprofen  (ADVIL ) 600 MG tablet, Take 1 tablet (600 mg total) by mouth every 6 (  six) hours. (Patient not taking: Reported on 06/10/2024), Disp: 30 tablet, Rfl: 0   witch hazel-glycerin  (TUCKS) pad, Apply 1 Application topically as needed for hemorrhoids. (Patient not taking: Reported on 06/10/2024), Disp: , Rfl:  Social History: Reviewed -  reports that she has quit smoking. Her smoking use included cigarettes. She has been exposed to tobacco smoke. She has never used smokeless tobacco.  Review of Systems:   Constitutional: Negative for fever and chills Eyes: Negative for visual disturbances Respiratory: Negative for shortness of breath, dyspnea Cardiovascular: Negative for chest pain or palpitations  Gastrointestinal: Negative for vomiting, diarrhea and constipation; no abdominal pain Genitourinary: Negative for dysuria and urgency, vaginal irritation or itching Musculoskeletal: Negative for back pain, joint pain,  myalgias  Neurological: Negative for dizziness and headaches    Objective Findings:    Physical Examination: Vitals:   06/10/24 0950  BP: 110/73  Pulse: (!) 101   General appearance - well appearing, and in no distress Mental status - alert, oriented to person, place, and time Chest:  Normal respiratory effort Heart - normal rate and regular rhythm  BP while BF 130/82  Consulted w/Dr. Jayne   No results found for this or any previous visit (from the past 24 hours).    Assessment & Plan:  A:   Dizziness asso w/BF  ??oxytocin  reaction P:  Stay hydrated, BF on side Note sent to Lactation for their input   No follow-ups on file.  Cathlean Ely CNM 06/10/2024 11:00 AM    '

## 2024-06-17 DIAGNOSIS — D485 Neoplasm of uncertain behavior of skin: Secondary | ICD-10-CM | POA: Diagnosis not present

## 2024-06-17 DIAGNOSIS — D225 Melanocytic nevi of trunk: Secondary | ICD-10-CM | POA: Diagnosis not present

## 2024-06-23 ENCOUNTER — Encounter: Payer: Self-pay | Admitting: Obstetrics & Gynecology

## 2024-06-24 MED ORDER — METRONIDAZOLE 0.75 % VA GEL: 1.0000 | Freq: Every day | VAGINAL | 5 refills | Status: DC

## 2024-07-08 ENCOUNTER — Ambulatory Visit: Admitting: Adult Health

## 2024-07-10 ENCOUNTER — Encounter: Payer: Self-pay | Admitting: Advanced Practice Midwife

## 2024-07-10 ENCOUNTER — Ambulatory Visit: Admitting: Advanced Practice Midwife

## 2024-07-10 DIAGNOSIS — Z1332 Encounter for screening for maternal depression: Secondary | ICD-10-CM

## 2024-07-10 MED ORDER — SLYND 4 MG PO TABS: 1.0000 | ORAL_TABLET | Freq: Every day | ORAL | 4 refills | Status: DC

## 2024-07-10 NOTE — Progress Notes (Signed)
 "  POSTPARTUM VISIT Patient name: Katherine Mora MRN [id number]  Date of birth: 02/01/1996 Chief Complaint:   Postpartum Care  History of Present Illness:   Katherine Mora is a 29 y.o. [phone] Caucasian female being seen today for a postpartum visit. She is 6 weeks postpartum following a spontaneous vaginal delivery at 39.1 gestational weeks. IOL: yes, for PROM. Anesthesia: epidural.  Laceration: none.  Complications: PROM x 40+ hours (no Triple I). Inpatient contraception: no.   Pregnancy uncomplicated. Tobacco use: former . Substance use disorder: no. Last pap smear: Aug 2022 and results were NILM w/ HRHPV not done. Next pap smear due: now No LMP recorded (lmp unknown).  Postpartum course has been uncomplicated. Bleeding none. Bowel function is alternates between constipation/diarrhea; managed w diet and fluids. Bladder function is normal. Urinary incontinence? no, fecal incontinence? no Patient is not sexually active. Last sexual activity: prior to birth of baby. Desired contraception: POPs. Patient does want a pregnancy in the future.  Desired family size is unsure number of children.   Upstream - 07/10/24 1041       Pregnancy Intention Screening   Does the patient want to become pregnant in the next year? No    Does the patient's partner want to become pregnant in the next year? No    Would the patient like to discuss contraceptive options today? No      Contraception Wrap Up   Current Method Abstinence    End Method Oral Contraceptive         The pregnancy intention screening data noted above was reviewed. Potential methods of contraception were discussed. The patient elected to proceed with Oral Contraceptive.  Edinburgh Postpartum Depression Screening: negative  Edinburgh Postnatal Depression Scale - 07/10/24 1045       Edinburgh Postnatal Depression Scale:  In the Past 7 Days   I have been able to laugh and see the funny side of things. 0    I have looked  forward with enjoyment to things. 0    I have blamed myself unnecessarily when things went wrong. 1    I have been anxious or worried for no good reason. 0    I have felt scared or panicky for no good reason. 0    Things have been getting on top of me. 1    I have been so unhappy that I have had difficulty sleeping. 0    I have felt sad or miserable. 0    I have been so unhappy that I have been crying. 0    The thought of harming myself has occurred to me. 0    Edinburgh Postnatal Depression Scale Total 2             11/22/2023   10:40 AM 07/24/2023   11:45 AM 06/29/2023    1:36 PM 01/13/2023   10:38 AM  GAD 7 : Generalized Anxiety Score  Nervous, Anxious, on Edge 2 0 0 1  Control/stop worrying 2 0 0 0  Worry too much - different things 1 1 0 0  Trouble relaxing 0 0 0 1  Restless 0 0 0 0  Easily annoyed or irritable 2 0 0 2  Afraid - awful might happen 0 0 0 0  Total GAD 7 Score 7 1 0 4  Anxiety Difficulty    Somewhat difficult     Baby's course has been uncomplicated. Baby is feeding by breast and bottle: milk supply adequate. Infant has a pediatrician/family doctor?  Yes.  Childcare strategy if returning to work/school: family.  Pt has material needs met for her and baby: Yes.   Review of Systems:   Pertinent items are noted in HPI Denies Abnormal vaginal discharge w/ itching/odor/irritation, headaches, visual changes, shortness of breath, chest pain, abdominal pain, severe nausea/vomiting, or problems with urination or bowel movements. Pertinent History Reviewed:  Reviewed past medical,surgical, obstetrical and family history.  Reviewed problem list, medications and allergies. OB History  Gravida Para Term Preterm AB Living  10 1 1  9 1   SAB IAB Ectopic Multiple Live Births  9   0 1    # Outcome Date GA Lbr Len/2nd Weight Sex Type Anes PTL Lv  10 Term 05/26/24 [redacted]w[redacted]d 20:29 / 00:14 7 lb 2.3 oz (3.24 kg) M Vag-Spont EPI  LIV  9 SAB 07/2023          8 SAB 10/12/21           7 SAB 2017 [redacted]w[redacted]d         6 SAB           5 SAB           4 SAB           3 SAB           2 SAB           1 SAB            Physical Assessment:   Vitals:   07/10/24 1043  BP: 115/77  Weight: 230 lb 9.6 oz (104.6 kg)  Height: 5' 4 (1.626 m)  Body mass index is 39.58 kg/m.       Physical Examination:   General appearance: alert, well appearing, and in no distress  Mental status: alert, oriented to person, place, and time  Skin: warm & dry   Cardiovascular: normal heart rate noted   Respiratory: normal respiratory effort, no distress   Breasts: deferred, no complaints   Abdomen: soft, non-tender   Pelvic: examination not indicated. Thin prep pap obtained: No  Rectal: not examined  Extremities: Edema: none        No results found for this or any previous visit (from the past 24 hours).  Assessment & Plan:  1) Postpartum exam 2) Six wks s/p spontaneous vaginal delivery 3) breast & bottle feeding 4) Depression screening, negative 5) Contraception management: rx Slynd  to start now and use backup x 1 month 6) Cervical cancer screening, prefers to have Pap/Physical at another time (scheduled for 08/07/24)  Essential components of care per ACOG recommendations:  1.  Mood and well being:  If positive depression screen, discussed and plan developed.  If using tobacco we discussed reduction/cessation and risk of relapse If current substance abuse, we discussed and referral to local resources was offered.   2. Infant care and feeding:  If breastfeeding, discussed returning to work, pumping, breastfeeding-associated pain, guidance regarding return to fertility while lactating if not using another method. If needed, patient was provided with a letter to be allowed to pump q 2-3hrs to support lactation in a private location with access to a refrigerator to store breastmilk.   Recommended that all caregivers be immunized for flu, pertussis and other preventable communicable  diseases If pt does not have material needs met for her/baby, referred to local resources for help obtaining these.  3. Sexuality, contraception and birth spacing Provided guidance regarding sexuality, management of dyspareunia, and resumption of intercourse Discussed avoiding interpregnancy interval <55mths  and recommended birth spacing of 18 months  4. Sleep and fatigue Discussed coping options for fatigue and sleep disruption Encouraged family/partner/community support of 4 hrs of uninterrupted sleep to help with mood and fatigue  5. Physical recovery  If pt had a C/S, assessed incisional pain and providing guidance on normal vs prolonged recovery If pt had a laceration, perineal healing and pain reviewed.  If urinary or fecal incontinence, discussed management and referred to PT or uro/gyn if indicated  Patient is safe to resume physical activity. Discussed attainment of healthy weight.  6.  Chronic disease management Discussed pregnancy complications if any, and their implications for future childbearing and long-term maternal health. Review recommendations for prevention of recurrent pregnancy complications, such as 17 hydroxyprogesterone caproate to reduce risk for recurrent PTB not applicable, or aspirin  to reduce risk of preeclampsia not applicable. Pt had GDM: no. If yes, 2hr GTT scheduled: not applicable. Reviewed medications and non-pregnant dosing including consideration of whether pt is breastfeeding using a reliable resource such as [employer]: yes Referred for f/u w/ PCP or subspecialist providers as indicated: not applicable  7. Health maintenance Mammogram at 28yo or earlier if indicated Pap smears as indicated  Meds:  Meds ordered this encounter  Medications   Drospirenone  (SLYND ) 4 MG TABS    Sig: Take 1 tablet (4 mg total) by mouth daily.    Dispense:  84 tablet    Refill:  4    Supervising Provider:   MARILYNN NEST [[id number]]    Follow-up: Return for Pap &  Physical.   No orders of the defined types were placed in this encounter.   Suzen JONETTA Gentry CNM 07/10/2024 11:07 AM   "

## 2024-07-10 NOTE — Patient Instructions (Signed)
Use the website www.postpartum.net for helpful resources!

## 2024-07-15 ENCOUNTER — Encounter: Payer: Self-pay | Admitting: Adult Health

## 2024-07-15 ENCOUNTER — Ambulatory Visit: Admitting: Adult Health

## 2024-07-15 VITALS — BP 112/78 | HR 130 | Temp 101.2°F | Ht 64.0 in | Wt 230.0 lb

## 2024-07-15 DIAGNOSIS — O9123 Nonpurulent mastitis associated with lactation: Secondary | ICD-10-CM | POA: Insufficient documentation

## 2024-07-15 MED ORDER — ONDANSETRON HCL 4 MG PO TABS: 4.0000 mg | ORAL_TABLET | Freq: Three times a day (TID) | ORAL | 1 refills | Status: DC | PRN

## 2024-07-15 MED ORDER — SULFAMETHOXAZOLE-TRIMETHOPRIM 800-160 MG PO TABS: 1.0000 | ORAL_TABLET | Freq: Two times a day (BID) | ORAL | 0 refills | Status: DC

## 2024-07-15 NOTE — Progress Notes (Signed)
" °  Subjective:     Patient ID: Katherine Mora, female   DOB: 12-30-1995, 29 y.o.   MRN: [id number]  HPI Katherine Mora is a 29 year old white female, with SO, G10,P1091, in complaining of pain in left breast started today and headache, nausea and diarrhea. She is about 28 weeks postpartum and is breastfeeding.    Component Value Date/Time   DIAGPAP  02/15/2021 1112    - Negative for intraepithelial lesion or malignancy (NILM)   ADEQPAP  02/15/2021 1112    Satisfactory for evaluation; transformation zone component PRESENT.    PCP is I Polanco NP  Review of Systems Pain in left breast, started today +headache +nausea  +diarrhea Reviewed past medical,surgical, social and family history. Reviewed medications and allergies.     Objective:   Physical Exam BP 112/78 (BP Location: Left Arm, Patient Position: Sitting, Cuff Size: Large)   Pulse (!) 130   Temp (!) 101.2 F (38.4 C)   Ht 5' 4 (1.626 m)   Wt 230 lb (104.3 kg)   Breastfeeding Yes   BMI 39.48 kg/m      Skin warm and dry,  Breasts:no dominate palpable mass, retraction or nipple discharge on the right, on the left, no retraction or nipple discharge, she does have lumps in breast like clogged ducts, and a red tender area at 3 0'clock about size of a quarter.  Fall risk is low  Upstream - 07/15/24 1142       Pregnancy Intention Screening   Does the patient want to become pregnant in the next year? No    Does the patient's partner want to become pregnant in the next year? No    Would the patient like to discuss contraceptive options today? No      Contraception Wrap Up   Current Method Oral Contraceptive    End Method Oral Contraceptive    Contraception Counseling Provided Yes          Assessment:     1. Mastitis associated with lactation (Primary) Pain in left breast started today Has headache, nausea, fever and diarrhea Use ice, and massage Will rx bactrim  DS, due to allergies  Will rx zofran  Take tylenol  and  advil  prn pain Continue to feed or pump Meds ordered this encounter  Medications   ondansetron  (ZOFRAN ) 4 MG tablet    Sig: Take 1 tablet (4 mg total) by mouth every 8 (eight) hours as needed.    Dispense:  30 tablet    Refill:  1    Supervising Provider:   JAYNE MINDER H [[id number]]   sulfamethoxazole -trimethoprim  (BACTRIM  DS) 800-160 MG tablet    Sig: Take 1 tablet by mouth 2 (two) times daily. Take 1 bid    Dispense:  14 tablet    Refill:  0    Supervising Provider:   JAYNE MINDER H [[id number]]   IF gets worse let us  know or go to ER Follow up 07/19/24 for recheck     Plan:     Follow up 07/19/24 for recheck    "

## 2024-07-17 ENCOUNTER — Ambulatory Visit

## 2024-07-19 ENCOUNTER — Encounter: Payer: Self-pay | Admitting: Adult Health

## 2024-07-19 ENCOUNTER — Ambulatory Visit (INDEPENDENT_AMBULATORY_CARE_PROVIDER_SITE_OTHER): Admitting: Adult Health

## 2024-07-19 VITALS — BP 112/72 | HR 103 | Ht 64.0 in | Wt 230.0 lb

## 2024-07-19 DIAGNOSIS — O9123 Nonpurulent mastitis associated with lactation: Secondary | ICD-10-CM

## 2024-07-19 NOTE — Progress Notes (Signed)
" °  Subjective:     Patient ID: Katherine Mora, female   DOB: [birth year], 29 y.o.   MRN: [id number]  HPI Reynolds is a 29 year old white female, with SO, [id number], back in follow up on mastitis, she is taking Bactrim  DS and is feeling better. But stomach burns with meds, even with eating, try yogurt.     Component Value Date/Time   DIAGPAP  02/15/2021 1112    - Negative for intraepithelial lesion or malignancy (NILM)   ADEQPAP  02/15/2021 1112    Satisfactory for evaluation; transformation zone component PRESENT.    PCP is I Polanco NP Review of Systems Feels better, breast not as painful, no fever or nausea  Stomach burns with bactrim  ds  Reviewed past medical,surgical, social and family history. Reviewed medications and allergies.     Objective:   Physical Exam BP 112/72 (BP Location: Left Arm, Patient Position: Sitting, Cuff Size: Large)   Pulse (!) 103   Ht 5' 4 (1.626 m)   Wt 230 lb (104.3 kg)   Breastfeeding Yes   BMI 39.48 kg/m     Skin warm and dry,  Breasts:no dominate palpable mass, retraction or nipple discharge, left breat not as tender or lumpy, still has lite red area at about 3 0'clock.   Upstream - 07/19/24 1117       Pregnancy Intention Screening   Does the patient want to become pregnant in the next year? No    Does the patient's partner want to become pregnant in the next year? No    Would the patient like to discuss contraceptive options today? No      Contraception Wrap Up   Current Method Oral Contraceptive    End Method Oral Contraceptive    Contraception Counseling Provided Yes          Assessment:     1. Mastitis associated with lactation (Primary) Much better, not as painful, still has    Finish bactrim  ds, try yogurt Continue ice if needed and massage breasts Continue to pump Plan:     Has appt 08/07/24 with [name] for Pap and physical, keep that  Call if needed     "

## 2024-08-07 ENCOUNTER — Encounter: Payer: Self-pay | Admitting: Advanced Practice Midwife

## 2024-08-07 ENCOUNTER — Ambulatory Visit: Admitting: Advanced Practice Midwife

## 2024-08-07 VITALS — BP 117/79 | HR 112 | Ht 64.0 in | Wt 230.0 lb

## 2024-08-07 DIAGNOSIS — Z1151 Encounter for screening for human papillomavirus (HPV): Secondary | ICD-10-CM | POA: Diagnosis not present

## 2024-08-07 DIAGNOSIS — Z793 Long term (current) use of hormonal contraceptives: Secondary | ICD-10-CM

## 2024-08-07 DIAGNOSIS — Z01419 Encounter for gynecological examination (general) (routine) without abnormal findings: Secondary | ICD-10-CM | POA: Diagnosis not present

## 2024-08-07 DIAGNOSIS — Z1331 Encounter for screening for depression: Secondary | ICD-10-CM | POA: Diagnosis not present

## 2024-08-07 MED ORDER — SLYND 4 MG PO TABS: 1.0000 | ORAL_TABLET | Freq: Every day | ORAL | 4 refills | Status: AC

## 2024-08-07 NOTE — Progress Notes (Signed)
 "  WELL-WOMAN EXAMINATION Patient name: Katherine Mora MRN [id number]  Date of birth: 1996/06/30 Chief Complaint:   Annual Exam  History of Present Illness:   Katherine Mora is a 29 y.o. [phone] Caucasian female being seen today for a routine well-woman exam.  Current complaints: constipation a bit worse but has things she's trying; breastfeeding  PCP: Terry Wilhelmena Falter FNP      does not desire labs No LMP recorded. The current method of family planning is oral progesterone -only contraceptive.  Last pap Aug 2022. Results were: NILM w/ HRHPV not done. H/O abnormal pap: no Last mammogram: (breast u/s Aug 2022). Results were: normal. Family h/o breast cancer: no Last colonoscopy: never. Results were: N/A. Family h/o colorectal cancer: no     08/07/2024   11:56 AM 03/06/2024   10:03 AM 11/22/2023   10:40 AM 07/24/2023   11:45 AM 06/29/2023    1:35 PM  Depression screen PHQ 2/9  Decreased Interest 0 0 0 0 0  Down, Depressed, Hopeless 0 0 0 0 0  PHQ - 2 Score 0 0 0 0 0  Altered sleeping 0 1 2 2  0  Tired, decreased energy 0 1 1 2  0  Change in appetite 0 0 0 0 0  Feeling bad or failure about yourself  0 0 0 0 0  Trouble concentrating 0 1 2 0 0  Moving slowly or fidgety/restless 0 0 0 0 0  Suicidal thoughts 0 0 0 0 0  PHQ-9 Score 0 3  5  4   0   Difficult doing work/chores     Not difficult at all     Data saved with a previous flowsheet row definition        08/07/2024   11:56 AM 11/22/2023   10:40 AM 07/24/2023   11:45 AM 06/29/2023    1:36 PM  GAD 7 : Generalized Anxiety Score  Nervous, Anxious, on Edge 0 2  0  0   Control/stop worrying 0 2  0  0   Worry too much - different things 0 1  1  0   Trouble relaxing 0 0  0  0   Restless 0 0  0  0   Easily annoyed or irritable 0 2  0  0   Afraid - awful might happen 0 0  0  0   Total GAD 7 Score 0 7 1 0     Data saved with a previous flowsheet row definition     Review of Systems:   Pertinent items are noted in  HPI Denies any headaches, blurred vision, fatigue, shortness of breath, chest pain, abdominal pain, abnormal vaginal discharge/itching/odor/irritation, problems with periods, bowel movements, urination, or intercourse unless otherwise stated above. Pertinent History Reviewed:  Reviewed past medical,surgical, social and family history.  Reviewed problem list, medications and allergies. Physical Assessment:   Vitals:   08/07/24 1151  BP: 117/79  Pulse: (!) 112  Weight: 230 lb (104.3 kg)  Height: 5' 4 (1.626 m)  Body mass index is 39.48 kg/m.        Physical Examination:   General appearance - well appearing, and in no distress  Mental status - alert, oriented to person, place, and time  Psych:  She has a normal mood and affect  Skin - warm and dry, normal color, no suspicious lesions noted  Chest - effort normal, all lung fields clear to auscultation bilaterally  Heart - normal rate and regular rhythm  Neck:  midline trachea, no thyromegaly or nodules  Breasts - breasts appear normal, no suspicious masses, no skin or nipple changes or  axillary nodes; some ductal tissue L side outer quad and R side inner quad  Abdomen - soft, nontender, nondistended, no masses or organomegaly  Pelvic - VULVA: normal appearing vulva with no masses, tenderness or lesions  VAGINA: normal appearing vagina with normal color and discharge, no lesions  CERVIX: normal appearing cervix without discharge or lesions, no CMT  Thin prep pap is done with HR HPV cotesting  UTERUS: uterus is felt to be normal size, shape, consistency and nontender   ADNEXA: No adnexal masses or tenderness noted.  Rectal - not examined  Extremities:  No swelling or varicosities noted  Chaperone: Aleck Blase  No results found for this or any previous visit (from the past 24 hours).  Assessment & Plan:  1) Well-Woman Exam  2) Lactating; tx for mastitis in Jan> resolved now  3) Doing well with Slynd   Labs/procedures today:  Pap  Mammogram: @ 28yo, or sooner if problems Colonoscopy: @ 28yo, or sooner if problems  No orders of the defined types were placed in this encounter.   Meds:  Meds ordered this encounter  Medications   Drospirenone  (SLYND ) 4 MG TABS    Sig: Take 1 tablet (4 mg total) by mouth daily.    Dispense:  84 tablet    Refill:  4    Follow-up: Return in about 1 year (around 08/07/2025) for Physical; (please give work note for husband saying he was here at [employer] visit).  Suzen JONETTA Gentry CNM 08/07/2024 1:04 PM  "

## 2024-08-08 LAB — CYTOLOGY - PAP
Adequacy: ABSENT
Comment: NEGATIVE
Diagnosis: NEGATIVE
High risk HPV: NEGATIVE
